# Patient Record
Sex: Female | Born: 1940
Health system: Southern US, Community
[De-identification: ages and names within clinical notes are randomized; demographics above are authoritative.]

## PROBLEM LIST (undated history)

## (undated) DIAGNOSIS — E785 Hyperlipidemia, unspecified: Secondary | ICD-10-CM

## (undated) DIAGNOSIS — Z90710 Acquired absence of both cervix and uterus: Secondary | ICD-10-CM

## (undated) DIAGNOSIS — F324 Major depressive disorder, single episode, in partial remission: Secondary | ICD-10-CM

## (undated) DIAGNOSIS — M0520 Rheumatoid vasculitis with rheumatoid arthritis of unspecified site: Secondary | ICD-10-CM

## (undated) DIAGNOSIS — M359 Systemic involvement of connective tissue, unspecified: Secondary | ICD-10-CM

## (undated) DIAGNOSIS — C801 Malignant (primary) neoplasm, unspecified: Secondary | ICD-10-CM

## (undated) DIAGNOSIS — K219 Gastro-esophageal reflux disease without esophagitis: Secondary | ICD-10-CM

## (undated) DIAGNOSIS — G709 Myoneural disorder, unspecified: Secondary | ICD-10-CM

## (undated) DIAGNOSIS — G629 Polyneuropathy, unspecified: Secondary | ICD-10-CM

## (undated) DIAGNOSIS — R0600 Dyspnea, unspecified: Secondary | ICD-10-CM

## (undated) DIAGNOSIS — R12 Heartburn: Secondary | ICD-10-CM

## (undated) DIAGNOSIS — I10 Essential (primary) hypertension: Secondary | ICD-10-CM

## (undated) HISTORY — DX: Major depressive disorder, single episode, in partial remission: F32.4

## (undated) HISTORY — PX: BREAST CYST ASPIRATION: SHX578

## (undated) HISTORY — DX: Heartburn: R12

## (undated) HISTORY — DX: Acquired absence of both cervix and uterus: Z90.710

## (undated) HISTORY — DX: Gastro-esophageal reflux disease without esophagitis: K21.9

## (undated) HISTORY — DX: Hyperlipidemia, unspecified: E78.5

---
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
## 1997-09-10 HISTORY — PX: OTHER SURGICAL HISTORY: SHX169

## 1998-04-11 ENCOUNTER — Ambulatory Visit (HOSPITAL_COMMUNITY): Admission: RE | Admit: 1998-04-11 | Discharge: 1998-04-11 | Payer: Self-pay

## 1998-06-01 ENCOUNTER — Observation Stay (HOSPITAL_COMMUNITY): Admission: RE | Admit: 1998-06-01 | Discharge: 1998-06-02 | Payer: Self-pay | Admitting: Orthopedic Surgery

## 1998-11-24 ENCOUNTER — Other Ambulatory Visit: Admission: RE | Admit: 1998-11-24 | Discharge: 1998-11-24 | Payer: Self-pay | Admitting: *Deleted

## 1999-01-19 ENCOUNTER — Ambulatory Visit (HOSPITAL_COMMUNITY): Admission: RE | Admit: 1999-01-19 | Discharge: 1999-01-19 | Payer: Self-pay | Admitting: *Deleted

## 1999-12-07 ENCOUNTER — Other Ambulatory Visit: Admission: RE | Admit: 1999-12-07 | Discharge: 1999-12-07 | Payer: Self-pay | Admitting: *Deleted

## 1999-12-29 ENCOUNTER — Encounter (INDEPENDENT_AMBULATORY_CARE_PROVIDER_SITE_OTHER): Payer: Self-pay

## 1999-12-29 ENCOUNTER — Other Ambulatory Visit: Admission: RE | Admit: 1999-12-29 | Discharge: 1999-12-29 | Payer: Self-pay | Admitting: *Deleted

## 2000-12-18 ENCOUNTER — Other Ambulatory Visit: Admission: RE | Admit: 2000-12-18 | Discharge: 2000-12-18 | Payer: Self-pay | Admitting: *Deleted

## 2001-12-19 ENCOUNTER — Other Ambulatory Visit: Admission: RE | Admit: 2001-12-19 | Discharge: 2001-12-19 | Payer: Self-pay | Admitting: *Deleted

## 2003-01-13 ENCOUNTER — Other Ambulatory Visit: Admission: RE | Admit: 2003-01-13 | Discharge: 2003-01-13 | Payer: Self-pay | Admitting: Obstetrics & Gynecology

## 2004-01-14 ENCOUNTER — Other Ambulatory Visit: Admission: RE | Admit: 2004-01-14 | Discharge: 2004-01-14 | Payer: Self-pay | Admitting: Obstetrics & Gynecology

## 2004-09-10 HISTORY — PX: SHOULDER SURGERY: SHX246

## 2004-10-03 ENCOUNTER — Ambulatory Visit: Payer: Self-pay | Admitting: General Practice

## 2005-10-02 ENCOUNTER — Ambulatory Visit: Payer: Self-pay | Admitting: Internal Medicine

## 2006-01-10 ENCOUNTER — Ambulatory Visit: Payer: Self-pay | Admitting: Specialist

## 2006-02-05 ENCOUNTER — Ambulatory Visit: Payer: Self-pay | Admitting: Specialist

## 2006-09-11 LAB — HM COLONOSCOPY: HM Colonoscopy: NORMAL

## 2006-10-08 ENCOUNTER — Ambulatory Visit: Payer: Self-pay | Admitting: Internal Medicine

## 2006-11-18 ENCOUNTER — Encounter: Admission: RE | Admit: 2006-11-18 | Discharge: 2006-11-18 | Payer: Self-pay

## 2006-12-06 ENCOUNTER — Encounter: Admission: RE | Admit: 2006-12-06 | Discharge: 2006-12-06 | Payer: Self-pay

## 2007-02-06 ENCOUNTER — Encounter: Admission: RE | Admit: 2007-02-06 | Discharge: 2007-02-06 | Payer: Self-pay

## 2007-02-20 ENCOUNTER — Encounter: Admission: RE | Admit: 2007-02-20 | Discharge: 2007-02-20 | Payer: Self-pay

## 2007-08-15 ENCOUNTER — Ambulatory Visit: Payer: Self-pay | Admitting: Internal Medicine

## 2007-10-16 ENCOUNTER — Ambulatory Visit: Payer: Self-pay | Admitting: Internal Medicine

## 2007-10-17 ENCOUNTER — Ambulatory Visit: Payer: Self-pay | Admitting: Internal Medicine

## 2008-03-17 ENCOUNTER — Encounter: Payer: Self-pay | Admitting: Neurosurgery

## 2008-04-14 ENCOUNTER — Ambulatory Visit: Payer: Self-pay | Admitting: Internal Medicine

## 2008-10-18 ENCOUNTER — Ambulatory Visit: Payer: Self-pay | Admitting: Internal Medicine

## 2009-08-18 ENCOUNTER — Ambulatory Visit: Payer: Self-pay | Admitting: Internal Medicine

## 2009-08-26 ENCOUNTER — Ambulatory Visit: Payer: Self-pay | Admitting: Neurosurgery

## 2009-09-10 HISTORY — PX: LUMBAR DISC SURGERY: SHX700

## 2009-09-10 HISTORY — PX: TOTAL KNEE ARTHROPLASTY: SHX125

## 2009-09-27 ENCOUNTER — Encounter: Admission: RE | Admit: 2009-09-27 | Discharge: 2009-09-27 | Payer: Self-pay | Admitting: Neurosurgery

## 2009-10-31 ENCOUNTER — Ambulatory Visit: Payer: Self-pay | Admitting: General Practice

## 2009-12-22 ENCOUNTER — Ambulatory Visit: Payer: Self-pay | Admitting: General Practice

## 2010-01-04 ENCOUNTER — Inpatient Hospital Stay: Payer: Self-pay | Admitting: General Practice

## 2010-01-06 ENCOUNTER — Encounter: Payer: Self-pay | Admitting: Internal Medicine

## 2010-01-08 ENCOUNTER — Encounter: Payer: Self-pay | Admitting: Internal Medicine

## 2010-03-23 ENCOUNTER — Encounter: Admission: RE | Admit: 2010-03-23 | Discharge: 2010-03-23 | Payer: Self-pay | Admitting: Neurosurgery

## 2010-04-19 ENCOUNTER — Inpatient Hospital Stay (HOSPITAL_COMMUNITY): Admission: RE | Admit: 2010-04-19 | Discharge: 2010-04-23 | Payer: Self-pay | Admitting: Neurosurgery

## 2010-05-25 ENCOUNTER — Encounter: Admission: RE | Admit: 2010-05-25 | Discharge: 2010-05-25 | Payer: Self-pay | Admitting: Neurosurgery

## 2010-07-04 ENCOUNTER — Encounter: Admission: RE | Admit: 2010-07-04 | Discharge: 2010-07-04 | Payer: Self-pay | Admitting: Neurosurgery

## 2010-08-22 ENCOUNTER — Ambulatory Visit: Payer: Self-pay | Admitting: Internal Medicine

## 2010-09-28 ENCOUNTER — Encounter
Admission: RE | Admit: 2010-09-28 | Discharge: 2010-09-28 | Payer: Self-pay | Source: Home / Self Care | Attending: Neurosurgery | Admitting: Neurosurgery

## 2010-11-24 LAB — CBC
HCT: 35.8 % — ABNORMAL LOW (ref 36.0–46.0)
Hemoglobin: 11.7 g/dL — ABNORMAL LOW (ref 12.0–15.0)
MCH: 30.3 pg (ref 26.0–34.0)
MCHC: 32.7 g/dL (ref 30.0–36.0)
MCV: 92.7 fL (ref 78.0–100.0)
Platelets: 299 10*3/uL (ref 150–400)
RBC: 3.86 MIL/uL — ABNORMAL LOW (ref 3.87–5.11)
RDW: 13.5 % (ref 11.5–15.5)
WBC: 5.2 10*3/uL (ref 4.0–10.5)

## 2010-11-24 LAB — BASIC METABOLIC PANEL
BUN: 22 mg/dL (ref 6–23)
CO2: 27 mEq/L (ref 19–32)
Calcium: 9.4 mg/dL (ref 8.4–10.5)
Chloride: 105 mEq/L (ref 96–112)
Creatinine, Ser: 0.83 mg/dL (ref 0.4–1.2)
GFR calc Af Amer: >60 mL/min (ref >60)
GFR calc non Af Amer: >60 mL/min (ref >60)
Glucose, Bld: 87 mg/dL (ref 70–99)
Sodium: 135 mEq/L (ref 135–145)

## 2010-11-24 LAB — ABO/RH: ABO/RH(D): A POS

## 2010-11-24 LAB — TYPE AND SCREEN: ABO/RH(D): A POS

## 2010-11-24 LAB — SURGICAL PCR SCREEN: MRSA, PCR: NEGATIVE

## 2010-11-29 ENCOUNTER — Ambulatory Visit: Payer: Self-pay | Admitting: Unknown Physician Specialty

## 2010-12-25 ENCOUNTER — Ambulatory Visit: Payer: Self-pay | Admitting: Unknown Physician Specialty

## 2010-12-26 ENCOUNTER — Ambulatory Visit
Admission: RE | Admit: 2010-12-26 | Discharge: 2010-12-26 | Disposition: A | Discharge status: Home / Self Care | Payer: Medicare Other | Source: Ambulatory Visit | Attending: Neurosurgery | Admitting: Neurosurgery

## 2010-12-26 ENCOUNTER — Other Ambulatory Visit: Payer: Self-pay | Admitting: Neurosurgery

## 2010-12-26 DIAGNOSIS — Z9889 Other specified postprocedural states: Secondary | ICD-10-CM

## 2010-12-28 LAB — PATHOLOGY REPORT

## 2011-04-28 ENCOUNTER — Ambulatory Visit: Payer: Self-pay | Admitting: Internal Medicine

## 2011-05-21 ENCOUNTER — Ambulatory Visit: Payer: Self-pay

## 2011-05-23 ENCOUNTER — Other Ambulatory Visit: Payer: Self-pay | Admitting: Rheumatology

## 2011-05-24 ENCOUNTER — Ambulatory Visit
Admission: RE | Admit: 2011-05-24 | Discharge: 2011-05-24 | Disposition: A | Discharge status: Home / Self Care | Payer: Medicare Other | Source: Ambulatory Visit | Attending: Rheumatology | Admitting: Rheumatology

## 2011-06-12 ENCOUNTER — Other Ambulatory Visit: Payer: Self-pay | Admitting: Neurosurgery

## 2011-06-12 ENCOUNTER — Ambulatory Visit
Admission: RE | Admit: 2011-06-12 | Discharge: 2011-06-12 | Disposition: A | Discharge status: Home / Self Care | Payer: Medicare Other | Source: Ambulatory Visit | Attending: Neurosurgery | Admitting: Neurosurgery

## 2011-06-12 DIAGNOSIS — M542 Cervicalgia: Secondary | ICD-10-CM

## 2011-06-12 DIAGNOSIS — M47817 Spondylosis without myelopathy or radiculopathy, lumbosacral region: Secondary | ICD-10-CM

## 2011-06-12 DIAGNOSIS — M519 Unspecified thoracic, thoracolumbar and lumbosacral intervertebral disc disorder: Secondary | ICD-10-CM

## 2011-06-12 DIAGNOSIS — M545 Low back pain: Secondary | ICD-10-CM

## 2011-07-19 ENCOUNTER — Encounter (HOSPITAL_BASED_OUTPATIENT_CLINIC_OR_DEPARTMENT_OTHER): Payer: Medicare Other | Attending: Internal Medicine

## 2011-07-19 DIAGNOSIS — K2100 Gastro-esophageal reflux disease with esophagitis, without bleeding: Secondary | ICD-10-CM | POA: Insufficient documentation

## 2011-07-19 DIAGNOSIS — Z79899 Other long term (current) drug therapy: Secondary | ICD-10-CM | POA: Insufficient documentation

## 2011-07-19 DIAGNOSIS — M7989 Other specified soft tissue disorders: Secondary | ICD-10-CM | POA: Insufficient documentation

## 2011-07-19 DIAGNOSIS — I10 Essential (primary) hypertension: Secondary | ICD-10-CM | POA: Insufficient documentation

## 2011-07-19 DIAGNOSIS — IMO0002 Reserved for concepts with insufficient information to code with codable children: Secondary | ICD-10-CM | POA: Insufficient documentation

## 2011-07-19 DIAGNOSIS — S91009A Unspecified open wound, unspecified ankle, initial encounter: Secondary | ICD-10-CM | POA: Insufficient documentation

## 2011-07-19 DIAGNOSIS — L02419 Cutaneous abscess of limb, unspecified: Secondary | ICD-10-CM | POA: Insufficient documentation

## 2011-07-19 DIAGNOSIS — S81009A Unspecified open wound, unspecified knee, initial encounter: Secondary | ICD-10-CM | POA: Insufficient documentation

## 2011-07-19 DIAGNOSIS — Y92009 Unspecified place in unspecified non-institutional (private) residence as the place of occurrence of the external cause: Secondary | ICD-10-CM | POA: Insufficient documentation

## 2011-07-19 NOTE — Progress Notes (Unsigned)
[facility]  NAME:  Caroline Conway, Caroline Conway             ACCOUNT NO.:  [id number]  MEDICAL RECORD NO.:  [id number]      DATE OF BIRTH:  1940-12-16  PHYSICIAN:  Maxwell Caul, M.D. VISIT DATE:  07/19/2011                                  OFFICE VISIT   Caroline Conway is a pleasant 70 year old woman referred by Dr. Dareen Piano at [facility] for review of a wound on her right lower leg.  Caroline Conway tells me that the wound came about in September when she traumatized her legs at home.  She took some time to try and manage this herself keeping it clean, covering with a Band-Aid, etc.  Ultimately she went to her primary care doctor and was prescribed a topical sulfa and that did not really help at all.  She took a course of Keflex for 3 weeks thinking there was surrounding cellulitis here, although I do not know that there was a culture result, ultimately she also received a course of clindamycin that does not seemed to have helped at all.  Around the same time in September, she started to develop painful nodules in her right and left leg.  I think the thought was that this was initially erythema nodosum; however, she tells me that Dr. Dareen Piano biopsied one of these areas, which did not show this.  She has been on methotrexate and Remicade in the past, although she is off Remicade due to headaches.  She is not on chronic prednisone.  PAST MEDICAL HISTORY: 1. Hypertension. 2. Reflux esophagitis. 3. Rheumatoid arthritis.  She has had surgery on her bilateral rotator cuffs, bilateral knee replacements, and back surgery.  CURRENT MEDICATION:  Reviewed.  She is on atenolol, simvastatin, hydrochlorothiazide, Wellbutrin, folic acid, Ambien, Biotin, and vitamin B12.  Interestingly, I do not see the methotrexate on our list.  ALLERGIES:  SULFA and PENICILLIN.  PHYSICAL EXAMINATION:  Temperature 98.4, pulse 58, respirations 18, blood pressure 142/89.   Circulation, her peripheral pulses were well palpable in her bilateral lower legs.  An ABI done on the right in this clinic was 0.8.  Her capillary refill time was within normal limits.  The wound is on the right anterior leg.  It measures 1.3 x 0.6 x 0.1. This was covered with eschar.  The eschar was removed with a #15 scalpel.  She tolerated this well.  The wound cleans up fairly nicely. I did culture this, although there is no convincing evidence of infection.  She has a violaceous color around this extending 0.5 cm around the wound.  More problematic than this, the wound is actually on the top edge of a more inflamed warm area that is roughly 2-1/2 x 2-1/2 inches below the wound.  She has a similar area on the left leg with swelling and warmth and tenderness; however, there is no open area here.  IMPRESSION: 1. Traumatic wound on the right anterior leg as described above.  This     underwent a non-excisional debridement.  We dressed this with     silver collagen covered by an occlusive Tielle which can be left on     to the next week. 2. Inflamed swollen areas on the right leg involving the wound, but     also on the left  leg without a wound.  Apparently biopsies have     been done of a similar area here, which does not show evidence of     erythema nodosum.  She is due to see Dermatology at [facility] next week.     Although I am puzzled about the etiology of this and look forward     to the consultation from Dermatology, any degree of chronic     inflammation will inhibit wound healing, so I am not surprised that     the small wound she had has been resistant to healing.  The wound was cultured today for C and S, although I did not add empiric antibiotics as mentioned.  She has also already gone through a 3-week course of Keflex and a 10-day course of clindamycin, neither of these made substantial difference to the wound per the patient.  She will be seen next week surrounding her trip  to [facility].          ______________________________ Maxwell Caul, M.D.     MGR/MEDQ  D:  07/19/2011  T:  07/19/2011  Job:  [id number]

## 2011-08-22 ENCOUNTER — Encounter (HOSPITAL_BASED_OUTPATIENT_CLINIC_OR_DEPARTMENT_OTHER): Payer: Medicare Other | Attending: Internal Medicine

## 2011-08-22 DIAGNOSIS — L97809 Non-pressure chronic ulcer of other part of unspecified lower leg with unspecified severity: Secondary | ICD-10-CM | POA: Insufficient documentation

## 2011-08-22 NOTE — Progress Notes (Signed)
[facility]  NAME:  ROSALEIGH, BRAZZEL             ACCOUNT NO.:  [id number]  MEDICAL RECORD NO.:  [id number]      DATE OF BIRTH:  Oct 01, 1940  PHYSICIAN:  Wayland Denis, DO       VISIT DATE:  08/22/2011                                  OFFICE VISIT   HISTORY:  Rezek is a 70 year old female who is here for followup on her right lower extremity ulcer.  She has been using collagen on it with excellent improvement.  She has epithelialized and granulated, and she is very pleased with the results.  She has some dry skin in the peri- wound area, which is common for this time of year.  There has been no change in her medications or social history.  PHYSICAL EXAMINATION:  She is alert, oriented, cooperative, very pleasant.  Her pupils are equal.  Extraocular muscles are intact.  Her breathing is unlabored.  Her heart rate is regular.  The wound is healed.  We will have her to continue with particular care to the area so that it does not open back up, and we would like her to use lotion on it and let us know if there is any change; otherwise, she has done extremely well.  I will see her back if needed.     Wayland Denis, DO     CS/MEDQ  D:  08/22/2011  T:  08/22/2011  Job:  [id number]

## 2011-08-23 ENCOUNTER — Encounter (HOSPITAL_BASED_OUTPATIENT_CLINIC_OR_DEPARTMENT_OTHER): Payer: Medicare Other

## 2011-09-06 ENCOUNTER — Ambulatory Visit: Payer: Self-pay | Admitting: Internal Medicine

## 2011-09-15 ENCOUNTER — Ambulatory Visit: Payer: Self-pay

## 2011-09-17 DIAGNOSIS — R1032 Left lower quadrant pain: Secondary | ICD-10-CM | POA: Diagnosis not present

## 2011-09-17 DIAGNOSIS — K219 Gastro-esophageal reflux disease without esophagitis: Secondary | ICD-10-CM | POA: Diagnosis not present

## 2011-09-25 DIAGNOSIS — M069 Rheumatoid arthritis, unspecified: Secondary | ICD-10-CM | POA: Diagnosis not present

## 2011-10-01 DIAGNOSIS — M1990 Unspecified osteoarthritis, unspecified site: Secondary | ICD-10-CM | POA: Diagnosis not present

## 2011-10-01 DIAGNOSIS — M069 Rheumatoid arthritis, unspecified: Secondary | ICD-10-CM | POA: Diagnosis not present

## 2011-11-20 DIAGNOSIS — M069 Rheumatoid arthritis, unspecified: Secondary | ICD-10-CM | POA: Diagnosis not present

## 2011-11-23 DIAGNOSIS — M5137 Other intervertebral disc degeneration, lumbosacral region: Secondary | ICD-10-CM | POA: Diagnosis not present

## 2011-11-23 DIAGNOSIS — M47817 Spondylosis without myelopathy or radiculopathy, lumbosacral region: Secondary | ICD-10-CM | POA: Diagnosis not present

## 2011-11-23 DIAGNOSIS — M25559 Pain in unspecified hip: Secondary | ICD-10-CM | POA: Diagnosis not present

## 2011-11-27 ENCOUNTER — Other Ambulatory Visit: Payer: Self-pay | Admitting: Neurosurgery

## 2011-11-27 DIAGNOSIS — M25552 Pain in left hip: Secondary | ICD-10-CM

## 2011-12-02 ENCOUNTER — Other Ambulatory Visit: Payer: Medicare Other

## 2011-12-02 ENCOUNTER — Ambulatory Visit
Admission: RE | Admit: 2011-12-02 | Discharge: 2011-12-02 | Disposition: A | Discharge status: Home / Self Care | Payer: Medicare Other | Source: Ambulatory Visit | Attending: Neurosurgery | Admitting: Neurosurgery

## 2011-12-02 DIAGNOSIS — M25559 Pain in unspecified hip: Secondary | ICD-10-CM | POA: Diagnosis not present

## 2011-12-02 DIAGNOSIS — M25552 Pain in left hip: Secondary | ICD-10-CM

## 2011-12-06 DIAGNOSIS — M47817 Spondylosis without myelopathy or radiculopathy, lumbosacral region: Secondary | ICD-10-CM | POA: Diagnosis not present

## 2011-12-06 DIAGNOSIS — M5137 Other intervertebral disc degeneration, lumbosacral region: Secondary | ICD-10-CM | POA: Diagnosis not present

## 2012-01-08 DIAGNOSIS — L52 Erythema nodosum: Secondary | ICD-10-CM | POA: Diagnosis not present

## 2012-01-08 DIAGNOSIS — M069 Rheumatoid arthritis, unspecified: Secondary | ICD-10-CM | POA: Diagnosis not present

## 2012-01-08 DIAGNOSIS — M1990 Unspecified osteoarthritis, unspecified site: Secondary | ICD-10-CM | POA: Diagnosis not present

## 2012-01-15 DIAGNOSIS — M069 Rheumatoid arthritis, unspecified: Secondary | ICD-10-CM | POA: Diagnosis not present

## 2012-01-18 DIAGNOSIS — M76899 Other specified enthesopathies of unspecified lower limb, excluding foot: Secondary | ICD-10-CM | POA: Diagnosis not present

## 2012-01-31 DIAGNOSIS — F411 Generalized anxiety disorder: Secondary | ICD-10-CM | POA: Diagnosis not present

## 2012-01-31 DIAGNOSIS — R51 Headache: Secondary | ICD-10-CM | POA: Diagnosis not present

## 2012-01-31 DIAGNOSIS — G4700 Insomnia, unspecified: Secondary | ICD-10-CM | POA: Diagnosis not present

## 2012-01-31 DIAGNOSIS — I10 Essential (primary) hypertension: Secondary | ICD-10-CM | POA: Diagnosis not present

## 2012-03-17 DIAGNOSIS — M069 Rheumatoid arthritis, unspecified: Secondary | ICD-10-CM | POA: Diagnosis not present

## 2012-03-19 DIAGNOSIS — G589 Mononeuropathy, unspecified: Secondary | ICD-10-CM | POA: Diagnosis not present

## 2012-03-19 DIAGNOSIS — D510 Vitamin B12 deficiency anemia due to intrinsic factor deficiency: Secondary | ICD-10-CM | POA: Diagnosis not present

## 2012-03-19 DIAGNOSIS — E785 Hyperlipidemia, unspecified: Secondary | ICD-10-CM | POA: Diagnosis not present

## 2012-03-19 DIAGNOSIS — R252 Cramp and spasm: Secondary | ICD-10-CM | POA: Diagnosis not present

## 2012-03-19 DIAGNOSIS — G4700 Insomnia, unspecified: Secondary | ICD-10-CM | POA: Diagnosis not present

## 2012-03-19 DIAGNOSIS — F411 Generalized anxiety disorder: Secondary | ICD-10-CM | POA: Diagnosis not present

## 2012-03-19 DIAGNOSIS — I10 Essential (primary) hypertension: Secondary | ICD-10-CM | POA: Diagnosis not present

## 2012-04-08 DIAGNOSIS — M1990 Unspecified osteoarthritis, unspecified site: Secondary | ICD-10-CM | POA: Diagnosis not present

## 2012-04-08 DIAGNOSIS — M069 Rheumatoid arthritis, unspecified: Secondary | ICD-10-CM | POA: Diagnosis not present

## 2012-04-08 DIAGNOSIS — M810 Age-related osteoporosis without current pathological fracture: Secondary | ICD-10-CM | POA: Diagnosis not present

## 2012-05-15 DIAGNOSIS — M069 Rheumatoid arthritis, unspecified: Secondary | ICD-10-CM | POA: Diagnosis not present

## 2012-05-23 DIAGNOSIS — G589 Mononeuropathy, unspecified: Secondary | ICD-10-CM | POA: Diagnosis not present

## 2012-05-23 DIAGNOSIS — I10 Essential (primary) hypertension: Secondary | ICD-10-CM | POA: Diagnosis not present

## 2012-05-23 DIAGNOSIS — N644 Mastodynia: Secondary | ICD-10-CM | POA: Diagnosis not present

## 2012-05-23 DIAGNOSIS — G4700 Insomnia, unspecified: Secondary | ICD-10-CM | POA: Diagnosis not present

## 2012-06-17 DIAGNOSIS — D235 Other benign neoplasm of skin of trunk: Secondary | ICD-10-CM | POA: Diagnosis not present

## 2012-06-17 DIAGNOSIS — D485 Neoplasm of uncertain behavior of skin: Secondary | ICD-10-CM | POA: Diagnosis not present

## 2012-06-17 DIAGNOSIS — C44319 Basal cell carcinoma of skin of other parts of face: Secondary | ICD-10-CM | POA: Diagnosis not present

## 2012-07-02 DIAGNOSIS — H538 Other visual disturbances: Secondary | ICD-10-CM | POA: Diagnosis not present

## 2012-07-03 DIAGNOSIS — C44319 Basal cell carcinoma of skin of other parts of face: Secondary | ICD-10-CM | POA: Diagnosis not present

## 2012-07-10 DIAGNOSIS — M069 Rheumatoid arthritis, unspecified: Secondary | ICD-10-CM | POA: Diagnosis not present

## 2012-07-15 DIAGNOSIS — Z23 Encounter for immunization: Secondary | ICD-10-CM | POA: Diagnosis not present

## 2012-07-17 DIAGNOSIS — Z96659 Presence of unspecified artificial knee joint: Secondary | ICD-10-CM | POA: Diagnosis not present

## 2012-07-21 DIAGNOSIS — F329 Major depressive disorder, single episode, unspecified: Secondary | ICD-10-CM | POA: Diagnosis not present

## 2012-07-24 DIAGNOSIS — H40009 Preglaucoma, unspecified, unspecified eye: Secondary | ICD-10-CM | POA: Diagnosis not present

## 2012-07-29 DIAGNOSIS — I10 Essential (primary) hypertension: Secondary | ICD-10-CM | POA: Diagnosis not present

## 2012-07-29 DIAGNOSIS — G609 Hereditary and idiopathic neuropathy, unspecified: Secondary | ICD-10-CM | POA: Diagnosis not present

## 2012-07-29 DIAGNOSIS — G2581 Restless legs syndrome: Secondary | ICD-10-CM | POA: Diagnosis not present

## 2012-07-30 DIAGNOSIS — H40009 Preglaucoma, unspecified, unspecified eye: Secondary | ICD-10-CM | POA: Diagnosis not present

## 2012-08-11 DIAGNOSIS — M810 Age-related osteoporosis without current pathological fracture: Secondary | ICD-10-CM | POA: Diagnosis not present

## 2012-08-11 DIAGNOSIS — M069 Rheumatoid arthritis, unspecified: Secondary | ICD-10-CM | POA: Diagnosis not present

## 2012-08-11 DIAGNOSIS — M1990 Unspecified osteoarthritis, unspecified site: Secondary | ICD-10-CM | POA: Diagnosis not present

## 2012-08-21 DIAGNOSIS — G609 Hereditary and idiopathic neuropathy, unspecified: Secondary | ICD-10-CM | POA: Diagnosis not present

## 2012-08-26 DIAGNOSIS — G589 Mononeuropathy, unspecified: Secondary | ICD-10-CM | POA: Diagnosis not present

## 2012-08-26 DIAGNOSIS — I10 Essential (primary) hypertension: Secondary | ICD-10-CM | POA: Diagnosis not present

## 2012-08-26 DIAGNOSIS — F329 Major depressive disorder, single episode, unspecified: Secondary | ICD-10-CM | POA: Diagnosis not present

## 2012-09-08 ENCOUNTER — Ambulatory Visit: Payer: Self-pay | Admitting: Internal Medicine

## 2012-09-08 DIAGNOSIS — Z1231 Encounter for screening mammogram for malignant neoplasm of breast: Secondary | ICD-10-CM | POA: Diagnosis not present

## 2012-10-07 DIAGNOSIS — Z85828 Personal history of other malignant neoplasm of skin: Secondary | ICD-10-CM | POA: Diagnosis not present

## 2012-10-07 DIAGNOSIS — D235 Other benign neoplasm of skin of trunk: Secondary | ICD-10-CM | POA: Diagnosis not present

## 2012-10-10 DIAGNOSIS — N390 Urinary tract infection, site not specified: Secondary | ICD-10-CM | POA: Diagnosis not present

## 2012-10-17 DIAGNOSIS — R319 Hematuria, unspecified: Secondary | ICD-10-CM | POA: Diagnosis not present

## 2012-10-20 DIAGNOSIS — G603 Idiopathic progressive neuropathy: Secondary | ICD-10-CM | POA: Diagnosis not present

## 2012-11-10 DIAGNOSIS — M069 Rheumatoid arthritis, unspecified: Secondary | ICD-10-CM | POA: Diagnosis not present

## 2012-11-10 DIAGNOSIS — M1990 Unspecified osteoarthritis, unspecified site: Secondary | ICD-10-CM | POA: Diagnosis not present

## 2012-11-10 DIAGNOSIS — M810 Age-related osteoporosis without current pathological fracture: Secondary | ICD-10-CM | POA: Diagnosis not present

## 2013-01-29 DIAGNOSIS — N8111 Cystocele, midline: Secondary | ICD-10-CM | POA: Diagnosis not present

## 2013-01-29 DIAGNOSIS — N3941 Urge incontinence: Secondary | ICD-10-CM | POA: Diagnosis not present

## 2013-01-29 DIAGNOSIS — N816 Rectocele: Secondary | ICD-10-CM | POA: Diagnosis not present

## 2013-01-29 DIAGNOSIS — N3946 Mixed incontinence: Secondary | ICD-10-CM | POA: Diagnosis not present

## 2013-02-06 IMAGING — MG MM DIGITAL SCREENING BILAT W/ CAD
1 series · 4 of 4 positions shown · non-contrast
Comparison: none

REASON FOR EXAM: SCR MAMMO NO ORDER
COMMENTS:

PROCEDURE:     MMM - MMM DGT SCR NO ORDER W/CAD  - September 08, 2012 [DATE]
RESULT:
Comparisons: 09/06/2011, 08/22/2010, 10/18/2008, 08/18/2009, and 04/14/2008.

[R CC · right · 4 of 4 slices shown]
[im 1/4]
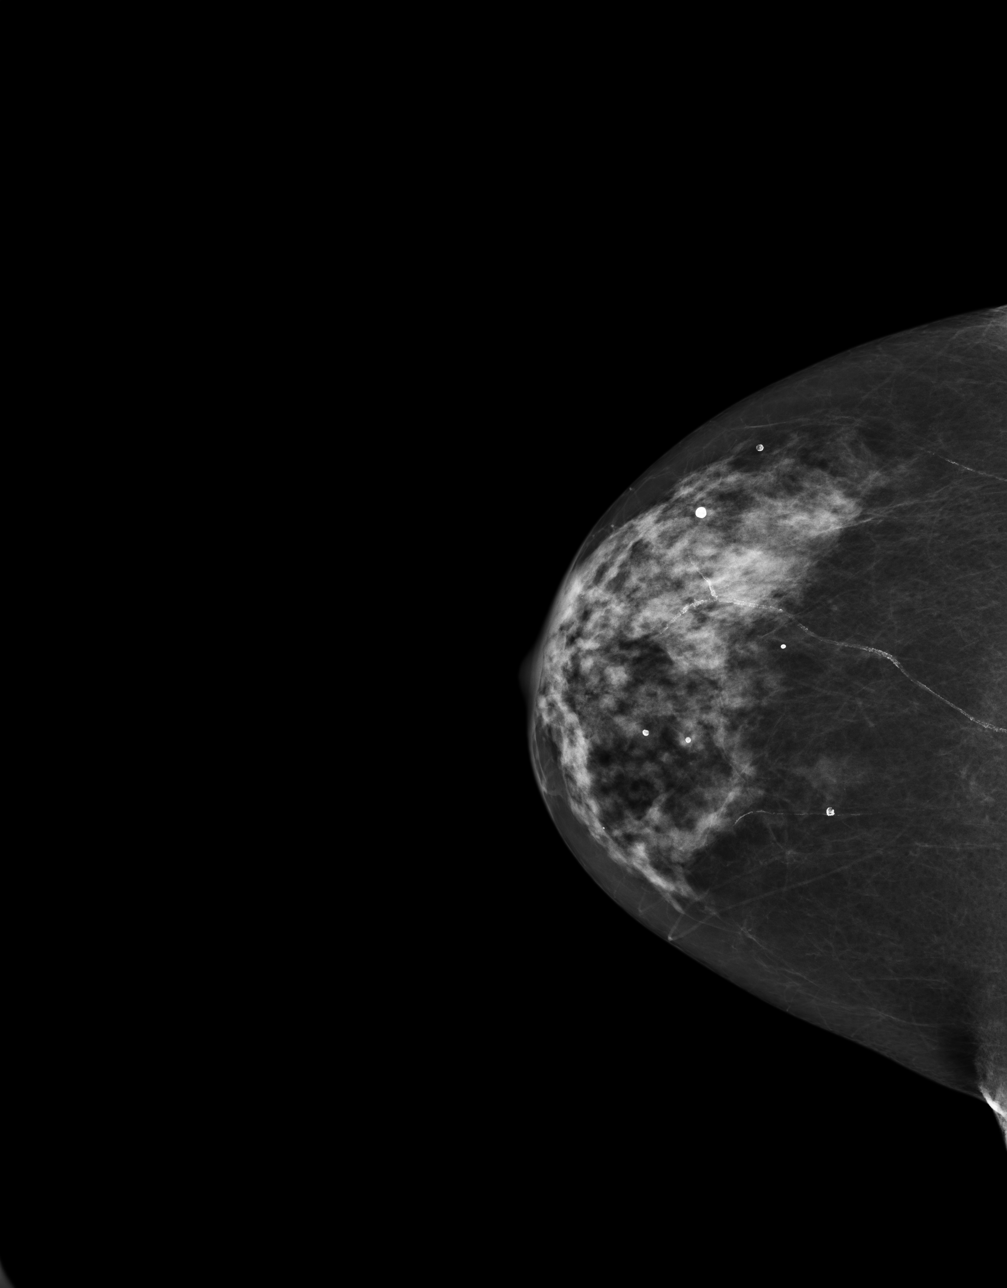
[im 2/4]
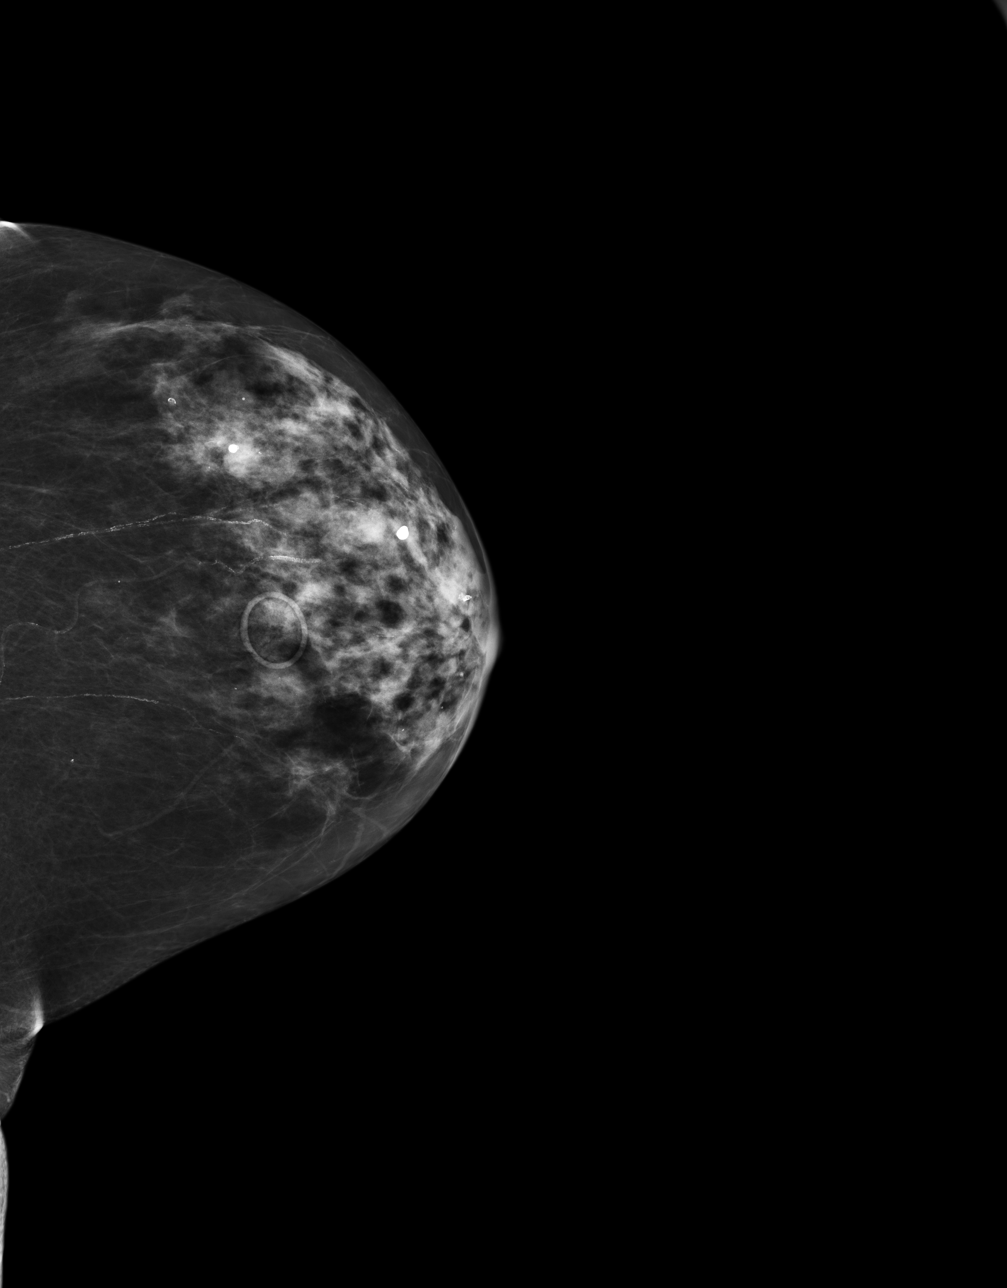
[im 3/4]
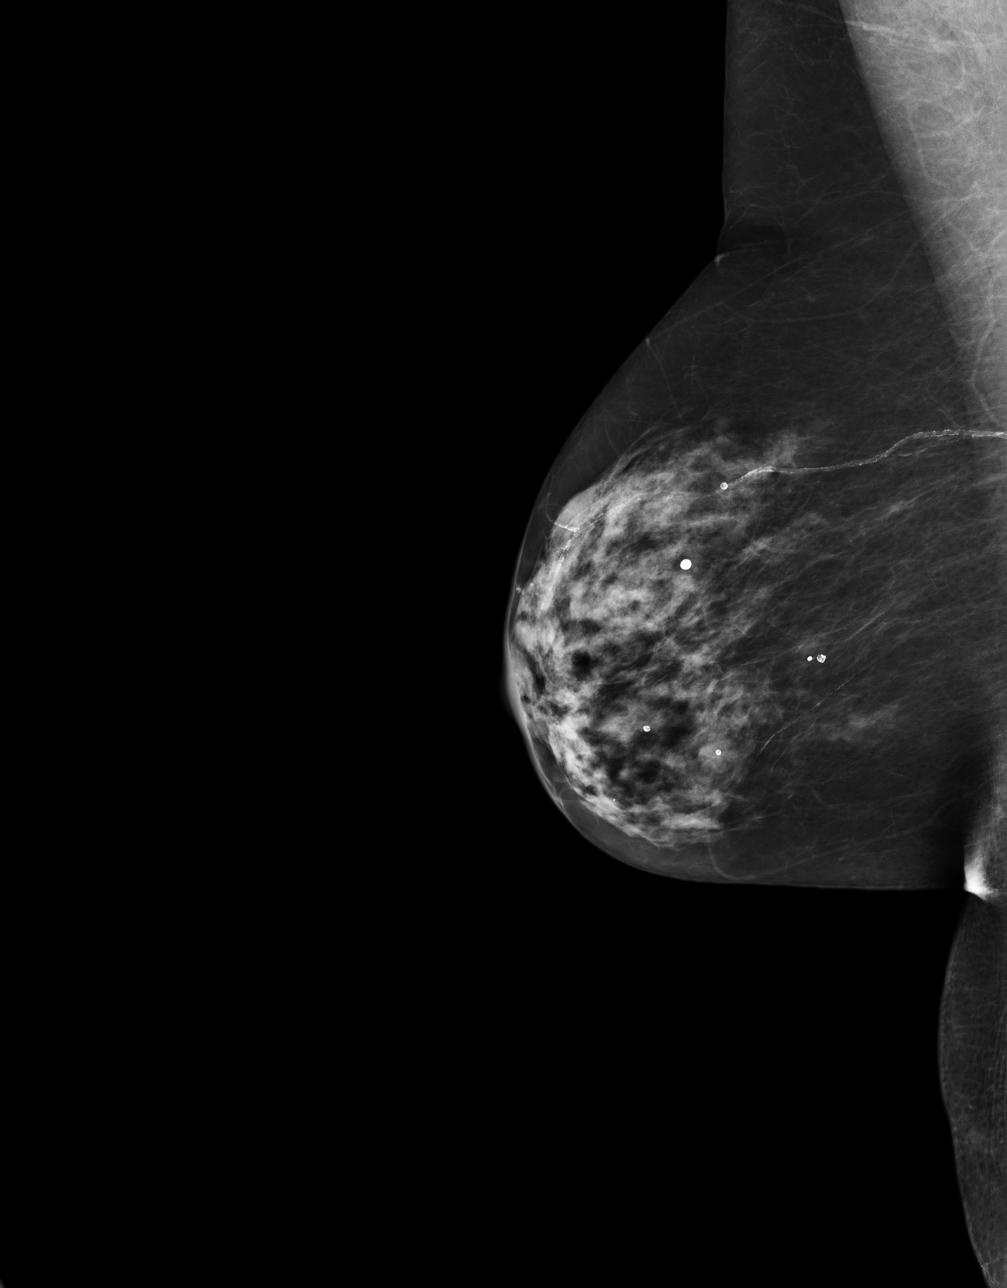
[im 4/4]
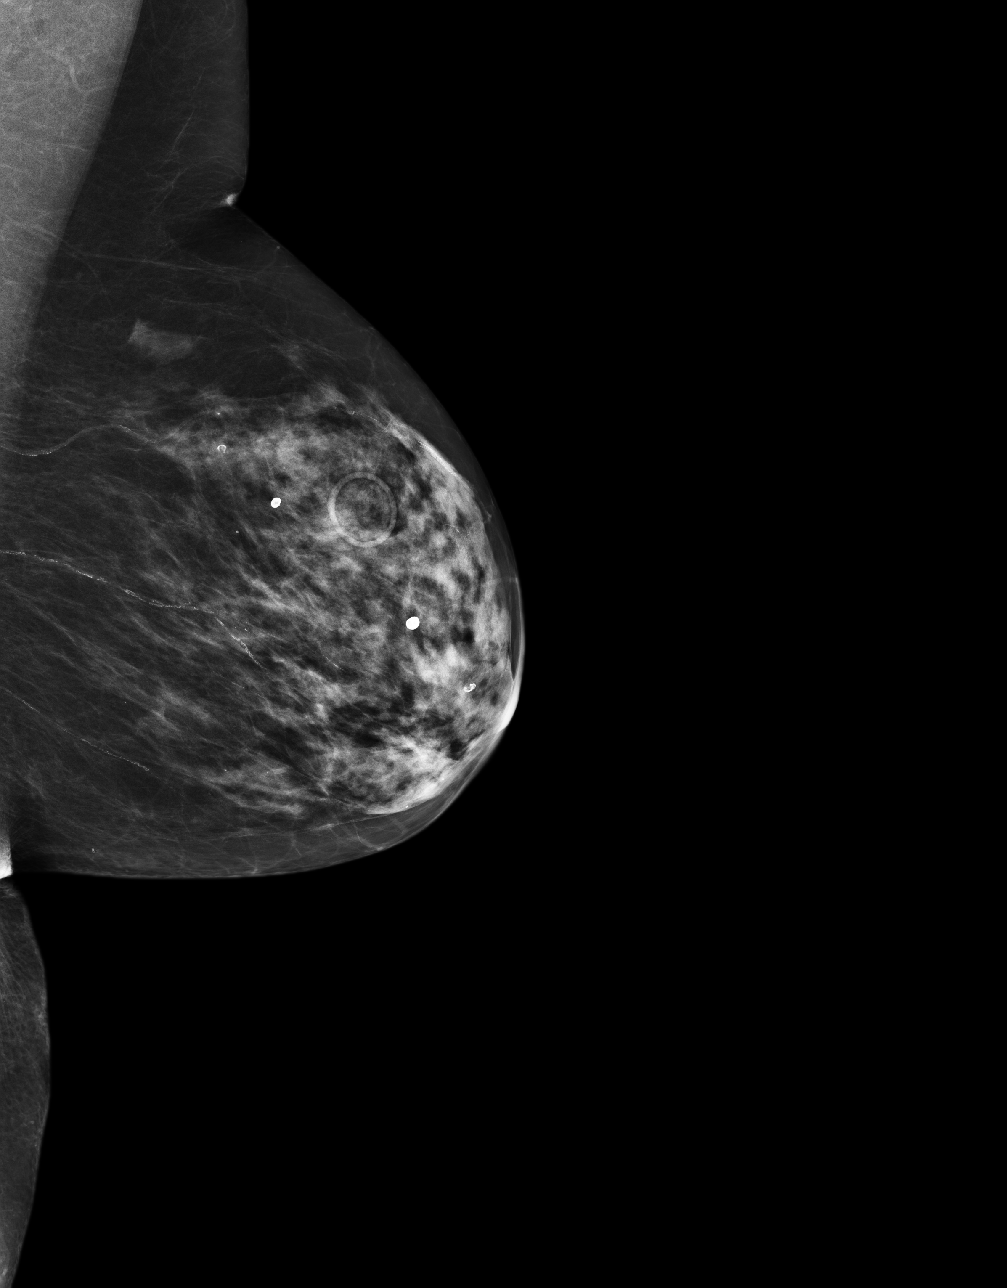

[4 of 4 positions shown; findings below may reference images not displayed]

FINDINGS: The breast tissue is heterogeneously dense, which may lower the sensitivity
of mammography. No suspicious masses or calcifications are identified. No
areas of architectural distortion.
IMPRESSION: BI-RADS: Category 1 - Negative

Recommend continued annual screening mammography.

A NEGATIVE MAMMOGRAM REPORT DOES NOT PRECLUDE BIOPSY OR OTHER EVALUATION OF
A CLINICALLY PALPABLE OR OTHERWISE SUSPICIOUS MASS OR LESION. BREAST CANCER
MAY NOT BE DETECTED BY MAMMOGRAPHY IN UP TO 10% OF CASES.

[REDACTED]

## 2013-02-10 DIAGNOSIS — M069 Rheumatoid arthritis, unspecified: Secondary | ICD-10-CM | POA: Diagnosis not present

## 2013-02-10 DIAGNOSIS — M810 Age-related osteoporosis without current pathological fracture: Secondary | ICD-10-CM | POA: Diagnosis not present

## 2013-02-10 DIAGNOSIS — M1990 Unspecified osteoarthritis, unspecified site: Secondary | ICD-10-CM | POA: Diagnosis not present

## 2013-02-23 DIAGNOSIS — J019 Acute sinusitis, unspecified: Secondary | ICD-10-CM | POA: Diagnosis not present

## 2013-03-01 ENCOUNTER — Ambulatory Visit: Payer: Self-pay | Admitting: Family Medicine

## 2013-03-01 DIAGNOSIS — M069 Rheumatoid arthritis, unspecified: Secondary | ICD-10-CM | POA: Diagnosis not present

## 2013-03-01 DIAGNOSIS — K219 Gastro-esophageal reflux disease without esophagitis: Secondary | ICD-10-CM | POA: Diagnosis not present

## 2013-03-01 DIAGNOSIS — N390 Urinary tract infection, site not specified: Secondary | ICD-10-CM | POA: Diagnosis not present

## 2013-03-01 DIAGNOSIS — I10 Essential (primary) hypertension: Secondary | ICD-10-CM | POA: Diagnosis not present

## 2013-03-01 LAB — URINALYSIS, COMPLETE
Nitrite: POSITIVE
Ph: 7 (ref 4.5–8.0)

## 2013-03-05 DIAGNOSIS — N3946 Mixed incontinence: Secondary | ICD-10-CM | POA: Diagnosis not present

## 2013-03-05 DIAGNOSIS — N819 Female genital prolapse, unspecified: Secondary | ICD-10-CM | POA: Diagnosis not present

## 2013-03-05 DIAGNOSIS — R319 Hematuria, unspecified: Secondary | ICD-10-CM | POA: Diagnosis not present

## 2013-04-20 DIAGNOSIS — G609 Hereditary and idiopathic neuropathy, unspecified: Secondary | ICD-10-CM | POA: Diagnosis not present

## 2013-04-20 DIAGNOSIS — R413 Other amnesia: Secondary | ICD-10-CM | POA: Diagnosis not present

## 2013-04-28 DIAGNOSIS — M069 Rheumatoid arthritis, unspecified: Secondary | ICD-10-CM | POA: Diagnosis not present

## 2013-05-19 DIAGNOSIS — M1990 Unspecified osteoarthritis, unspecified site: Secondary | ICD-10-CM | POA: Diagnosis not present

## 2013-05-19 DIAGNOSIS — M810 Age-related osteoporosis without current pathological fracture: Secondary | ICD-10-CM | POA: Diagnosis not present

## 2013-05-19 DIAGNOSIS — M069 Rheumatoid arthritis, unspecified: Secondary | ICD-10-CM | POA: Diagnosis not present

## 2013-06-22 DIAGNOSIS — D235 Other benign neoplasm of skin of trunk: Secondary | ICD-10-CM | POA: Diagnosis not present

## 2013-06-22 DIAGNOSIS — Z85828 Personal history of other malignant neoplasm of skin: Secondary | ICD-10-CM | POA: Diagnosis not present

## 2013-06-22 DIAGNOSIS — L821 Other seborrheic keratosis: Secondary | ICD-10-CM | POA: Diagnosis not present

## 2013-06-22 DIAGNOSIS — L570 Actinic keratosis: Secondary | ICD-10-CM | POA: Diagnosis not present

## 2013-06-25 DIAGNOSIS — Z96659 Presence of unspecified artificial knee joint: Secondary | ICD-10-CM | POA: Diagnosis not present

## 2013-07-02 DIAGNOSIS — Z0000 Encounter for general adult medical examination without abnormal findings: Secondary | ICD-10-CM | POA: Diagnosis not present

## 2013-07-02 DIAGNOSIS — F329 Major depressive disorder, single episode, unspecified: Secondary | ICD-10-CM | POA: Diagnosis not present

## 2013-07-02 DIAGNOSIS — I10 Essential (primary) hypertension: Secondary | ICD-10-CM | POA: Diagnosis not present

## 2013-07-02 DIAGNOSIS — Z23 Encounter for immunization: Secondary | ICD-10-CM | POA: Diagnosis not present

## 2013-07-02 DIAGNOSIS — E785 Hyperlipidemia, unspecified: Secondary | ICD-10-CM | POA: Diagnosis not present

## 2013-07-20 DIAGNOSIS — R319 Hematuria, unspecified: Secondary | ICD-10-CM | POA: Diagnosis not present

## 2013-08-03 DIAGNOSIS — R310 Gross hematuria: Secondary | ICD-10-CM | POA: Diagnosis not present

## 2013-08-12 ENCOUNTER — Ambulatory Visit: Payer: Self-pay | Admitting: Urology

## 2013-08-12 DIAGNOSIS — R9389 Abnormal findings on diagnostic imaging of other specified body structures: Secondary | ICD-10-CM | POA: Diagnosis not present

## 2013-08-12 DIAGNOSIS — R319 Hematuria, unspecified: Secondary | ICD-10-CM | POA: Diagnosis not present

## 2013-08-12 DIAGNOSIS — N390 Urinary tract infection, site not specified: Secondary | ICD-10-CM | POA: Diagnosis not present

## 2013-08-13 DIAGNOSIS — J040 Acute laryngitis: Secondary | ICD-10-CM | POA: Diagnosis not present

## 2013-08-17 DIAGNOSIS — R9389 Abnormal findings on diagnostic imaging of other specified body structures: Secondary | ICD-10-CM | POA: Diagnosis not present

## 2013-08-17 DIAGNOSIS — R310 Gross hematuria: Secondary | ICD-10-CM | POA: Diagnosis not present

## 2013-09-09 ENCOUNTER — Ambulatory Visit: Payer: Self-pay | Admitting: Internal Medicine

## 2013-09-09 DIAGNOSIS — Z1231 Encounter for screening mammogram for malignant neoplasm of breast: Secondary | ICD-10-CM | POA: Diagnosis not present

## 2013-09-21 DIAGNOSIS — G609 Hereditary and idiopathic neuropathy, unspecified: Secondary | ICD-10-CM | POA: Diagnosis not present

## 2013-09-22 DIAGNOSIS — M1990 Unspecified osteoarthritis, unspecified site: Secondary | ICD-10-CM | POA: Diagnosis not present

## 2013-09-22 DIAGNOSIS — M810 Age-related osteoporosis without current pathological fracture: Secondary | ICD-10-CM | POA: Diagnosis not present

## 2013-09-22 DIAGNOSIS — M069 Rheumatoid arthritis, unspecified: Secondary | ICD-10-CM | POA: Diagnosis not present

## 2013-10-01 DIAGNOSIS — R9389 Abnormal findings on diagnostic imaging of other specified body structures: Secondary | ICD-10-CM | POA: Diagnosis not present

## 2013-10-01 DIAGNOSIS — R310 Gross hematuria: Secondary | ICD-10-CM | POA: Diagnosis not present

## 2013-10-05 ENCOUNTER — Ambulatory Visit: Payer: Self-pay | Admitting: Urology

## 2013-10-05 DIAGNOSIS — Z01812 Encounter for preprocedural laboratory examination: Secondary | ICD-10-CM | POA: Diagnosis not present

## 2013-10-05 DIAGNOSIS — Z01810 Encounter for preprocedural cardiovascular examination: Secondary | ICD-10-CM | POA: Diagnosis not present

## 2013-10-05 DIAGNOSIS — I10 Essential (primary) hypertension: Secondary | ICD-10-CM | POA: Diagnosis not present

## 2013-10-05 DIAGNOSIS — R310 Gross hematuria: Secondary | ICD-10-CM | POA: Diagnosis not present

## 2013-10-05 DIAGNOSIS — I119 Hypertensive heart disease without heart failure: Secondary | ICD-10-CM | POA: Diagnosis not present

## 2013-10-05 LAB — POTASSIUM: POTASSIUM: 3.8 mmol/L (ref 3.5–5.1)

## 2013-10-12 ENCOUNTER — Ambulatory Visit: Payer: Self-pay | Admitting: Urology

## 2013-10-12 DIAGNOSIS — Z885 Allergy status to narcotic agent status: Secondary | ICD-10-CM | POA: Diagnosis not present

## 2013-10-12 DIAGNOSIS — Z85828 Personal history of other malignant neoplasm of skin: Secondary | ICD-10-CM | POA: Diagnosis not present

## 2013-10-12 DIAGNOSIS — R310 Gross hematuria: Secondary | ICD-10-CM | POA: Diagnosis not present

## 2013-10-12 DIAGNOSIS — N135 Crossing vessel and stricture of ureter without hydronephrosis: Secondary | ICD-10-CM | POA: Diagnosis not present

## 2013-10-12 DIAGNOSIS — Z96659 Presence of unspecified artificial knee joint: Secondary | ICD-10-CM | POA: Diagnosis not present

## 2013-10-12 DIAGNOSIS — M069 Rheumatoid arthritis, unspecified: Secondary | ICD-10-CM | POA: Diagnosis not present

## 2013-10-12 DIAGNOSIS — Z882 Allergy status to sulfonamides status: Secondary | ICD-10-CM | POA: Diagnosis not present

## 2013-10-12 DIAGNOSIS — R1013 Epigastric pain: Secondary | ICD-10-CM | POA: Diagnosis not present

## 2013-10-12 DIAGNOSIS — R319 Hematuria, unspecified: Secondary | ICD-10-CM | POA: Diagnosis not present

## 2013-10-12 DIAGNOSIS — G609 Hereditary and idiopathic neuropathy, unspecified: Secondary | ICD-10-CM | POA: Diagnosis not present

## 2013-10-12 DIAGNOSIS — I10 Essential (primary) hypertension: Secondary | ICD-10-CM | POA: Diagnosis not present

## 2013-10-12 DIAGNOSIS — R9389 Abnormal findings on diagnostic imaging of other specified body structures: Secondary | ICD-10-CM | POA: Diagnosis not present

## 2013-10-12 DIAGNOSIS — K3189 Other diseases of stomach and duodenum: Secondary | ICD-10-CM | POA: Diagnosis not present

## 2013-10-12 DIAGNOSIS — Z880 Allergy status to penicillin: Secondary | ICD-10-CM | POA: Diagnosis not present

## 2013-10-16 DIAGNOSIS — R310 Gross hematuria: Secondary | ICD-10-CM | POA: Diagnosis not present

## 2013-12-28 DIAGNOSIS — M069 Rheumatoid arthritis, unspecified: Secondary | ICD-10-CM | POA: Diagnosis not present

## 2013-12-28 DIAGNOSIS — M1990 Unspecified osteoarthritis, unspecified site: Secondary | ICD-10-CM | POA: Diagnosis not present

## 2014-01-11 DIAGNOSIS — M069 Rheumatoid arthritis, unspecified: Secondary | ICD-10-CM | POA: Diagnosis not present

## 2014-01-25 DIAGNOSIS — M069 Rheumatoid arthritis, unspecified: Secondary | ICD-10-CM | POA: Diagnosis not present

## 2014-02-22 DIAGNOSIS — M069 Rheumatoid arthritis, unspecified: Secondary | ICD-10-CM | POA: Diagnosis not present

## 2014-03-29 DIAGNOSIS — M810 Age-related osteoporosis without current pathological fracture: Secondary | ICD-10-CM | POA: Diagnosis not present

## 2014-03-29 DIAGNOSIS — M069 Rheumatoid arthritis, unspecified: Secondary | ICD-10-CM | POA: Diagnosis not present

## 2014-03-29 DIAGNOSIS — M1990 Unspecified osteoarthritis, unspecified site: Secondary | ICD-10-CM | POA: Diagnosis not present

## 2014-04-19 DIAGNOSIS — M069 Rheumatoid arthritis, unspecified: Secondary | ICD-10-CM | POA: Diagnosis not present

## 2014-04-20 DIAGNOSIS — N390 Urinary tract infection, site not specified: Secondary | ICD-10-CM | POA: Diagnosis not present

## 2014-04-20 DIAGNOSIS — I10 Essential (primary) hypertension: Secondary | ICD-10-CM | POA: Diagnosis not present

## 2014-04-28 DIAGNOSIS — R3129 Other microscopic hematuria: Secondary | ICD-10-CM | POA: Diagnosis not present

## 2014-04-28 DIAGNOSIS — R9389 Abnormal findings on diagnostic imaging of other specified body structures: Secondary | ICD-10-CM | POA: Diagnosis not present

## 2014-05-10 ENCOUNTER — Ambulatory Visit: Payer: Self-pay | Admitting: Urology

## 2014-05-10 DIAGNOSIS — K5900 Constipation, unspecified: Secondary | ICD-10-CM | POA: Diagnosis not present

## 2014-05-10 DIAGNOSIS — R319 Hematuria, unspecified: Secondary | ICD-10-CM | POA: Diagnosis not present

## 2014-05-10 DIAGNOSIS — D259 Leiomyoma of uterus, unspecified: Secondary | ICD-10-CM | POA: Diagnosis not present

## 2014-05-10 DIAGNOSIS — Z1389 Encounter for screening for other disorder: Secondary | ICD-10-CM | POA: Diagnosis not present

## 2014-05-10 LAB — CREATININE, SERUM: Creatinine: 0.92 mg/dL (ref 0.60–1.30)

## 2014-05-12 DIAGNOSIS — S92919A Unspecified fracture of unspecified toe(s), initial encounter for closed fracture: Secondary | ICD-10-CM | POA: Diagnosis not present

## 2014-05-12 DIAGNOSIS — L03039 Cellulitis of unspecified toe: Secondary | ICD-10-CM | POA: Diagnosis not present

## 2014-05-12 DIAGNOSIS — L02619 Cutaneous abscess of unspecified foot: Secondary | ICD-10-CM | POA: Diagnosis not present

## 2014-05-12 DIAGNOSIS — M79609 Pain in unspecified limb: Secondary | ICD-10-CM | POA: Diagnosis not present

## 2014-05-13 DIAGNOSIS — R3129 Other microscopic hematuria: Secondary | ICD-10-CM | POA: Diagnosis not present

## 2014-05-26 DIAGNOSIS — G44099 Other trigeminal autonomic cephalgias (TAC), not intractable: Secondary | ICD-10-CM | POA: Diagnosis not present

## 2014-05-26 DIAGNOSIS — I999 Unspecified disorder of circulatory system: Secondary | ICD-10-CM | POA: Diagnosis not present

## 2014-05-27 DIAGNOSIS — M069 Rheumatoid arthritis, unspecified: Secondary | ICD-10-CM | POA: Diagnosis not present

## 2014-06-09 DIAGNOSIS — S92919A Unspecified fracture of unspecified toe(s), initial encounter for closed fracture: Secondary | ICD-10-CM | POA: Diagnosis not present

## 2014-06-17 DIAGNOSIS — M79609 Pain in unspecified limb: Secondary | ICD-10-CM | POA: Diagnosis not present

## 2014-06-17 DIAGNOSIS — M7989 Other specified soft tissue disorders: Secondary | ICD-10-CM | POA: Diagnosis not present

## 2014-06-17 DIAGNOSIS — I10 Essential (primary) hypertension: Secondary | ICD-10-CM | POA: Diagnosis not present

## 2014-06-17 DIAGNOSIS — E785 Hyperlipidemia, unspecified: Secondary | ICD-10-CM | POA: Diagnosis not present

## 2014-06-18 DIAGNOSIS — R312 Other microscopic hematuria: Secondary | ICD-10-CM | POA: Diagnosis not present

## 2014-06-23 ENCOUNTER — Ambulatory Visit: Payer: Self-pay | Admitting: Neurology

## 2014-06-23 DIAGNOSIS — H40003 Preglaucoma, unspecified, bilateral: Secondary | ICD-10-CM | POA: Diagnosis not present

## 2014-06-23 DIAGNOSIS — R51 Headache: Secondary | ICD-10-CM | POA: Diagnosis not present

## 2014-06-24 ENCOUNTER — Ambulatory Visit: Payer: Self-pay | Admitting: Urology

## 2014-06-24 DIAGNOSIS — N3289 Other specified disorders of bladder: Secondary | ICD-10-CM | POA: Diagnosis not present

## 2014-07-02 DIAGNOSIS — M79641 Pain in right hand: Secondary | ICD-10-CM | POA: Diagnosis not present

## 2014-07-02 DIAGNOSIS — M79642 Pain in left hand: Secondary | ICD-10-CM | POA: Diagnosis not present

## 2014-07-02 DIAGNOSIS — M0579 Rheumatoid arthritis with rheumatoid factor of multiple sites without organ or systems involvement: Secondary | ICD-10-CM | POA: Diagnosis not present

## 2014-07-02 DIAGNOSIS — M810 Age-related osteoporosis without current pathological fracture: Secondary | ICD-10-CM | POA: Diagnosis not present

## 2014-07-02 DIAGNOSIS — M159 Polyosteoarthritis, unspecified: Secondary | ICD-10-CM | POA: Diagnosis not present

## 2014-07-15 DIAGNOSIS — E784 Other hyperlipidemia: Secondary | ICD-10-CM | POA: Diagnosis not present

## 2014-07-15 DIAGNOSIS — F5101 Primary insomnia: Secondary | ICD-10-CM | POA: Diagnosis not present

## 2014-07-15 DIAGNOSIS — K219 Gastro-esophageal reflux disease without esophagitis: Secondary | ICD-10-CM | POA: Diagnosis not present

## 2014-07-15 DIAGNOSIS — Z0000 Encounter for general adult medical examination without abnormal findings: Secondary | ICD-10-CM | POA: Diagnosis not present

## 2014-07-15 DIAGNOSIS — F329 Major depressive disorder, single episode, unspecified: Secondary | ICD-10-CM | POA: Diagnosis not present

## 2014-07-15 DIAGNOSIS — N3001 Acute cystitis with hematuria: Secondary | ICD-10-CM | POA: Diagnosis not present

## 2014-07-15 DIAGNOSIS — I10 Essential (primary) hypertension: Secondary | ICD-10-CM | POA: Diagnosis not present

## 2014-07-15 LAB — TSH: TSH: 1.17 u[IU]/mL (ref <=5.90)

## 2014-07-15 LAB — CBC AND DIFFERENTIAL
HCT: 37 % (ref 36–46)
HEMOGLOBIN: 12.5 g/dL (ref 12.0–16.0)

## 2014-07-15 LAB — LIPID PANEL
Cholesterol: 185 mg/dL (ref 0–200)
HDL: 72 mg/dL — AB (ref 35–70)
LDL CALC: 92 mg/dL
TRIGLYCERIDES: 103 mg/dL (ref 40–160)

## 2014-07-15 LAB — BASIC METABOLIC PANEL
BUN: 24 mg/dL — AB (ref 4–21)
CREATININE: 1 mg/dL (ref <=1.1)

## 2014-07-22 DIAGNOSIS — Z23 Encounter for immunization: Secondary | ICD-10-CM | POA: Diagnosis not present

## 2014-07-28 DIAGNOSIS — M0579 Rheumatoid arthritis with rheumatoid factor of multiple sites without organ or systems involvement: Secondary | ICD-10-CM | POA: Diagnosis not present

## 2014-07-28 DIAGNOSIS — M0540 Rheumatoid myopathy with rheumatoid arthritis of unspecified site: Secondary | ICD-10-CM | POA: Diagnosis not present

## 2014-08-03 DIAGNOSIS — M79609 Pain in unspecified limb: Secondary | ICD-10-CM | POA: Diagnosis not present

## 2014-08-03 DIAGNOSIS — M7989 Other specified soft tissue disorders: Secondary | ICD-10-CM | POA: Diagnosis not present

## 2014-08-03 DIAGNOSIS — I8310 Varicose veins of unspecified lower extremity with inflammation: Secondary | ICD-10-CM | POA: Diagnosis not present

## 2014-08-11 DIAGNOSIS — H40003 Preglaucoma, unspecified, bilateral: Secondary | ICD-10-CM | POA: Diagnosis not present

## 2014-08-17 DIAGNOSIS — Z1283 Encounter for screening for malignant neoplasm of skin: Secondary | ICD-10-CM | POA: Diagnosis not present

## 2014-08-17 DIAGNOSIS — D485 Neoplasm of uncertain behavior of skin: Secondary | ICD-10-CM | POA: Diagnosis not present

## 2014-08-17 DIAGNOSIS — Z872 Personal history of diseases of the skin and subcutaneous tissue: Secondary | ICD-10-CM | POA: Diagnosis not present

## 2014-08-17 DIAGNOSIS — L821 Other seborrheic keratosis: Secondary | ICD-10-CM | POA: Diagnosis not present

## 2014-08-24 DIAGNOSIS — H40003 Preglaucoma, unspecified, bilateral: Secondary | ICD-10-CM | POA: Diagnosis not present

## 2014-09-11 LAB — HM MAMMOGRAPHY: HM MAMMO: NORMAL

## 2014-09-23 ENCOUNTER — Ambulatory Visit: Payer: Self-pay | Admitting: Internal Medicine

## 2014-12-28 ENCOUNTER — Ambulatory Visit
Admit: 2014-12-28 | Disposition: A | Discharge status: Home / Self Care | Payer: Self-pay | Attending: Urology | Admitting: Urology

## 2015-01-01 NOTE — Op Note (Signed)
PATIENT NAME:  Caroline Conway, Caroline Conway MR#:  [id number] DATE OF BIRTH:  04/06/1941  DATE OF PROCEDURE:  10/12/2013  PREOPERATIVE DIAGNOSES: Possible filling defect distal right ureter, gross hematuria.   POSTOPERATIVE DIAGNOSES: Possible filling defect distal right ureter, gross hematuria, with a stricture in distal right ureter. No tumor.   PROCEDURE: Right ureteroscopy, right retrograde pyelogram and cystoscopy with stent.   PROCEDURE DETAILS: As follows: With the patient sterilely prepped and draped in supine lithotomy position for ease of approach of the external genitalia, the procedure was begun. The patient had a cystoscopy done. The bladder shows no evidence of tumor, mass or growth. Distal ureter was easily seen on the right, instrumented with an 0.038 Glidewire. Just before we did the Glidewire, we did a right retrograde. Saw no major filling defects. The right retrograde catheter was an open-ended right ureteral catheter 5-French in size. Through this, the Glidewire was placed up into the kidney. Next, to the Sheldon, I did take a long telescoping 7-French ureteroscope to look up the ureter. There was a stricture distally and that is probably what was seen on x-ray. Over a wire, I can get the scope up, so it is not a grossly bad stricture that needs to be dilated. I viewed the ureter throughout its length, and I find no tumors, masses or growths. There is some irritation distally, but it is not tumorous in nature. So, at this point, unless she has severe gross hematuria in the future and growing mass, I am not going to biopsy anything because there is nothing really to biopsy. So, I leave a 6-French 26 cm stent in place, one end curled in the bladder and one end curled in the kidney and strings attached. She tolerates the procedure well. Sent to recovery in satisfactory condition with an empty bladder, 30 mL of 0.5% Marcaine in the bladder and a B and O suppository.     ____________________________ Janice Coffin. Elnoria Howard, DO rdh:gb D: 10/12/2013 15:58:58 ET T: 10/13/2013 02:28:07 ET JOB#: [id number]  cc: Janice Coffin. Elnoria Howard, DO, <Dictator> Samamtha Tiegs D Loyda Costin DO ELECTRONICALLY SIGNED 11/13/2013 14:22

## 2015-01-29 ENCOUNTER — Encounter: Payer: Self-pay | Admitting: Internal Medicine

## 2015-01-29 DIAGNOSIS — N952 Postmenopausal atrophic vaginitis: Secondary | ICD-10-CM | POA: Insufficient documentation

## 2015-01-29 DIAGNOSIS — F324 Major depressive disorder, single episode, in partial remission: Secondary | ICD-10-CM | POA: Insufficient documentation

## 2015-01-29 DIAGNOSIS — R319 Hematuria, unspecified: Secondary | ICD-10-CM | POA: Insufficient documentation

## 2015-01-29 DIAGNOSIS — E785 Hyperlipidemia, unspecified: Secondary | ICD-10-CM | POA: Insufficient documentation

## 2015-01-29 DIAGNOSIS — F5101 Primary insomnia: Secondary | ICD-10-CM | POA: Insufficient documentation

## 2015-01-29 DIAGNOSIS — F419 Anxiety disorder, unspecified: Secondary | ICD-10-CM | POA: Insufficient documentation

## 2015-01-29 DIAGNOSIS — N8110 Cystocele, unspecified: Secondary | ICD-10-CM | POA: Insufficient documentation

## 2015-01-29 DIAGNOSIS — K219 Gastro-esophageal reflux disease without esophagitis: Secondary | ICD-10-CM | POA: Insufficient documentation

## 2015-01-29 DIAGNOSIS — N3941 Urge incontinence: Secondary | ICD-10-CM | POA: Insufficient documentation

## 2015-01-29 DIAGNOSIS — M069 Rheumatoid arthritis, unspecified: Secondary | ICD-10-CM | POA: Insufficient documentation

## 2015-01-29 DIAGNOSIS — G609 Hereditary and idiopathic neuropathy, unspecified: Secondary | ICD-10-CM | POA: Insufficient documentation

## 2015-01-29 DIAGNOSIS — I10 Essential (primary) hypertension: Secondary | ICD-10-CM | POA: Insufficient documentation

## 2015-01-29 HISTORY — DX: Major depressive disorder, single episode, in partial remission: F32.4

## 2015-03-21 ENCOUNTER — Ambulatory Visit
Admit: 2015-03-21 | Discharge: 2015-03-21 | Disposition: A | Discharge status: Home / Self Care | Payer: PPO | Attending: Internal Medicine | Admitting: Internal Medicine

## 2015-03-21 ENCOUNTER — Ambulatory Visit: Payer: PPO

## 2015-03-21 ENCOUNTER — Ambulatory Visit
Admission: EM | Admit: 2015-03-21 | Discharge: 2015-03-21 | Disposition: A | Discharge status: Home / Self Care | Payer: PPO | Attending: Internal Medicine | Admitting: Internal Medicine

## 2015-03-21 DIAGNOSIS — S66911A Strain of unspecified muscle, fascia and tendon at wrist and hand level, right hand, initial encounter: Secondary | ICD-10-CM

## 2015-03-21 DIAGNOSIS — J341 Cyst and mucocele of nose and nasal sinus: Secondary | ICD-10-CM | POA: Diagnosis not present

## 2015-03-21 DIAGNOSIS — W19XXXA Unspecified fall, initial encounter: Secondary | ICD-10-CM | POA: Insufficient documentation

## 2015-03-21 DIAGNOSIS — S0993XA Unspecified injury of face, initial encounter: Secondary | ICD-10-CM

## 2015-03-21 DIAGNOSIS — S62514A Nondisplaced fracture of proximal phalanx of right thumb, initial encounter for closed fracture: Secondary | ICD-10-CM | POA: Insufficient documentation

## 2015-03-21 DIAGNOSIS — S0990XA Unspecified injury of head, initial encounter: Secondary | ICD-10-CM

## 2015-03-21 DIAGNOSIS — Y92009 Unspecified place in unspecified non-institutional (private) residence as the place of occurrence of the external cause: Secondary | ICD-10-CM | POA: Diagnosis not present

## 2015-03-21 DIAGNOSIS — S62501A Fracture of unspecified phalanx of right thumb, initial encounter for closed fracture: Secondary | ICD-10-CM

## 2015-03-21 DIAGNOSIS — M25531 Pain in right wrist: Secondary | ICD-10-CM | POA: Diagnosis present

## 2015-03-21 HISTORY — DX: Polyneuropathy, unspecified: G62.9

## 2015-03-21 HISTORY — DX: Rheumatoid vasculitis with rheumatoid arthritis of unspecified site: M05.20

## 2015-03-21 HISTORY — DX: Essential (primary) hypertension: I10

## 2015-03-21 MED ORDER — ACETAMINOPHEN 500 MG PO TABS
500.0000 mg | ORAL_TABLET | Freq: Once | ORAL | Status: AC
  Administered 2015-03-21: 500 mg via ORAL

## 2015-03-21 NOTE — ED Notes (Signed)
Pt states "pt states I fell at home about 2 hours ago, tripped on rocks, hit my nose on the corner of [place]. It hurt my nose, I want to make sure it's not broken, and my left wrist and thumb. I have an abrasion on my both knees and my nose." Pt denies LOC. Alert and oriented at present.

## 2015-03-21 NOTE — Discharge Instructions (Signed)
Please report directly to [facility] imaging center that you are familiar with for CT of Head and CT of Facial bones without contrast...they will call us with the answer so please wait when finished  Please contact [facility] for follow up on your thumb fracture. It is important that you have it evaluated for possible additional care. In the meantime your splint is 24/7  ALL   the time  !!  Replace a wt one with the clean one and let the first one dry again... I have attached the report for your thumb film for Ortho use-they can access if part of the [facility] system  Thank you for choosing [place] for your care today !!   Jan Fireman PA-C   .Finger Fracture Fractures of fingers are breaks in the bones of the fingers. There are many types of fractures. There are different ways of treating these fractures. Your health care provider will discuss the best way to treat your fracture. CAUSES Traumatic injury is the main cause of broken fingers. These include:  Injuries while playing sports.  Workplace injuries.  Falls. RISK FACTORS Activities that can increase your risk of finger fractures include:  Sports.  Workplace activities that involve machinery.  A condition called osteoporosis, which can make your bones less dense and cause them to fracture more easily. SIGNS AND SYMPTOMS The main symptoms of a broken finger are pain and swelling within 15 minutes after the injury. Other symptoms include:  Bruising of your finger.  Stiffness of your finger.  Numbness of your finger.  Exposed bones (compound fracture) if the fracture is severe. DIAGNOSIS  The best way to diagnose a broken bone is with X-ray imaging. Additionally, your health care provider will use this X-ray image to evaluate the position of the broken finger bones.  TREATMENT  Finger fractures can be treated with:   Nonreduction--This means the bones are in place. The finger is splinted without changing the  positions of the bone pieces. The splint is usually left on for about a week to 10 days. This will depend on your fracture and what your health care provider thinks.  Closed reduction--The bones are put back into position without using surgery. The finger is then splinted.  Open reduction and internal fixation--The fracture site is opened. Then the bone pieces are fixed into place with pins or some type of hardware. This is seldom required. It depends on the severity of the fracture. HOME CARE INSTRUCTIONS   Follow your health care provider's instructions regarding activities, exercises, and physical therapy.  Only take over-the-counter or prescription medicines for pain, discomfort, or fever as directed by your health care provider. SEEK MEDICAL CARE IF: You have pain or swelling that limits the motion or use of your fingers. SEEK IMMEDIATE MEDICAL CARE IF:  Your finger becomes numb. MAKE SURE YOU:   Understand these instructions.  Will watch your condition.  Will get help right away if you are not doing well or get worse. Document Released: 12/09/2000 Document Revised: 06/17/2013 Document Reviewed: 04/08/2013 [facility] Patient Information 2015 [place], [place]. This information is not intended to replace advice given to you by your health care provider. Make sure you discuss any questions you have with your health care provider.

## 2015-03-21 NOTE — ED Notes (Signed)
Correction in nursing note. Extremity is right thumb, and wrist.

## 2015-03-21 NOTE — ED Provider Notes (Signed)
CSN: [id number]     Arrival date & time 03/21/15  1323 History   None    Chief Complaint  Patient presents with  . Fall  . Facial Injury  . Wrist Pain  . Finger Injury  . Abrasion   (Consider location/radiation/quality/duration/timing/severity/associated sxs/prior Treatment) HPI   74 yo F returned from exercise class and was moving around automobile  when she tripped. Face planted on edge of cement parking platform with bridge of nose. Reports bleeding from both nostrils, abraded and bruised nose. Denies visual changes headache or neckpain. Caught right hand and wrist underneath her with resultant right wrist and right thumb pain. Both knees fell into the gravel and are abraded. History left knee replacement/bilateral shoulder surgery and lumbar rod placement. Rolled herself over to a grassy area and was able to rise without assistance..  Ambulatory at the scene . Went into the house and cleaned face, changed clothes and drove herself here. She denies any discomfort other than as mentioned. Specifically denies  LOC,  Denies being stunned, denies numbness or tingling peripherally- furious with herself for what she "not paying close enough attention"- preoccupied with her "to Do " list.Recently widowed early this year and home alone. Sister presented at [facility] . One son out of town this week, other son lives in [place]. Past Medical History  Diagnosis Date  . Hypertension   . Neuropathy   . Rheumatoid arteritis    Past Surgical History  Procedure Laterality Date  . Total knee arthroplasty Left 2011  . Riight shoulder surgery Right 1999  . Shoulder surgery Left 2006  . Lumbar disc surgery  2011  . Appendectomy  1960   Family History  Problem Relation Age of Onset  . Alzheimer's disease Mother   . Cancer Father    History  Substance Use Topics  . Smoking status: Never Smoker   . Smokeless tobacco: Not on file  . Alcohol Use: No   OB History    No data available     Review of  Systems Constitutional: No fever.  Eyes: No visual changes. ENT:No sore throat.Known deviated septum Cardiovascular:Negative for chest pain/palpitations Respiratory: Negative for shortness of breath Gastrointestinal: No abdominal pain. No nausea,vomiting, diarrhea Genitourinary: Negative for dysuria. Normal urination. Musculoskeletal: Negative for back pain. FROM extremities without pain except right wrist and thumb, bilateral knee abrasions mild. Ambulatory without assistance Skin: Negative for rash Neurological: Negative for headache, focal weakness or numbness. She denied numbness or tingling, thought disruption, visual change, headache or pain anywhere except bridge of nose and right thumb.  Allergies  Codeine sulfate; Oxycodone; Penicillins; and Sulfa antibiotics  Home Medications   Prior to Admission medications   Medication Sig Start Date End Date Taking? Authorizing Provider  atenolol (TENORMIN) 50 MG tablet Take 1 tablet by mouth daily. 06/10/14  Yes Historical Provider, MD  citalopram (CELEXA) 10 MG tablet Take 1 tablet by mouth daily. 07/15/14  Yes Historical Provider, MD  conjugated estrogens (PREMARIN) vaginal cream Place 1 application vaginally 2 (two) times a week. 08/18/14  Yes Historical Provider, MD  esomeprazole (NEXIUM) 20 MG capsule Take 1 capsule by mouth daily.   Yes Historical Provider, MD  folic acid (FOLVITE) 1 MG tablet Take 1 tablet by mouth daily.   Yes Historical Provider, MD  gabapentin (NEURONTIN) 300 MG capsule Take 3 capsules by mouth daily. 1 in AM and 2 at HS   Yes Historical Provider, MD  hydrochlorothiazide (HYDRODIURIL) 25 MG tablet Take 1 tablet by mouth  daily. 07/15/14  Yes Historical Provider, MD  meloxicam (MOBIC) 15 MG tablet Take 1 tablet by mouth daily.   Yes Historical Provider, MD  methotrexate 2.5 MG tablet Take 8 tablets by mouth once a week.   Yes Historical Provider, MD  mirtazapine (REMERON) 15 MG tablet Take 1 tablet by mouth at bedtime.  07/15/14  Yes Historical Provider, MD  simvastatin (ZOCOR) 20 MG tablet Take 1 tablet by mouth at bedtime. 07/15/14  Yes Historical Provider, MD  zolpidem (AMBIEN) 5 MG tablet Take 1 tablet by mouth at bedtime. 07/15/14  Yes Historical Provider, MD   BP 116/70 mmHg  Pulse 60  Temp(Src) 98.6 F (37 C) (Tympanic)  Resp 16  Ht 5\' 1"  (1.549 m)  Wt 159 lb (72.122 kg)  BMI 30.06 kg/m2  SpO2 99% Physical Exam   Constitutional -alert and oriented,well appearing except abraded, bruised and swollen nasal bridge,  Guarding right hand and wrist Head- normocephalic Eyes- conjunctiva normal, EOMI ,conjugate gaze Ears- canals clear, TMs neg Nose- evidence of previous nasal bleeding , currently  stopped. Bridge very tender, edema, ecchymosis Mouth/throat- mucous membranes moist ,oropharynx non-erythematous, teeth intact Neck- supple, denies pain,  without glandular enlargement CV- regular rate, grossly normal heart sounds, G 1-2/6 SEM Resp-no distress, normal respiratory effort,clear to auscultation bilaterally GI- soft,non-tender,no distention GU- not examined MSK- right wrist and right thumb discomfort. Right thumb  Ecchymotic and swollen with decreased ROM. Extremities otherwise normal ROM, very mild abrasions bilateral knees. Left with surgical scar. Good ROM w/o crepitus or pain. Ambulatory, self-care accomplished.  Neuro- normal speech and language, no gross focal neurological deficit appreciated, no gait instability, CNS as tested were all WNL Skin-warm,dry ,minor abrasions bilateral knees, nasal bridge Psych-mood and affect grossly normal; speech and behavior grossly normal ED Course  Procedures (including critical care time) Labs Review Labs Reviewed - No data to display  Imaging Review  Films failed to drop - Right wrist without evidence of fracture Right thumb acute non-displaced intra-articular fracture of distal phalanx   Xrays discussed with patient- Right thumb placed in a frog  Hug splint supporting distal segment and DIP joint. She is given a disc of the film and the written report and is to consult with Orthopedics ASAP this week. She has connections with Ortho that cared for her knee replacement and wishes to make her own  Arrangements.  MDM   1. Head trauma, initial encounter   2. Facial injury, initial encounter   3. Wrist strain, right, initial encounter   4. Thumb fracture, right, closed, initial encounter     Plan: 1. Test/x-ray results and diagnosis reviewed with patient and her sister. She is splinted and is given info to hand carry to Ortho to review for care of her thumb, Strongly encouraged to leave thumb in splint , to ice and elevate and to continue her Mobic. 2. CT is not available at our facility today but she is counselled to have facial bones and head scanned. Sister is here to transport her and they are both in agreement to travel to [facility] screening. STAT  Appointment  is set up  3. Head trauma precautions were reviewed with both and they express understanding. Patient states that she will agree to follow up care if her sister deems it appropriate or if she herself has concerns. 4. Recommend supportive treatment with ice packs to right thumb and skin protected to nasal bridge, 20 minute cycles. Rx o abraded areas with triple antibiotic ointment of  choice applied very lightly - keep tissue hydrated without letting it melt into eyes., include knees. 5. Report to PCP triage nurse tomorrow to update personal chart 4. F/u prn if symptoms worsen or don't improve- or present to the ER of her choice with any concerns- head injury issues again reviewed.  Addendum- Patient and her sister went to [facility] and had CT head/face. I received a called report that both were negative for fracture or acute  intercranial findings. Ms McAlsiter was placed on the phone so I could communicate the information to her. Head trauma precautions reviewed. RTC [facility] or ER of  choice with questions or concerns. To Ortho for evaluation ASAP it can be arranged.   Jan Fireman, PA-C 03/23/15 [id number]

## 2015-04-09 ENCOUNTER — Other Ambulatory Visit: Payer: Self-pay | Admitting: Internal Medicine

## 2015-04-11 HISTORY — PX: VAGINAL HYSTERECTOMY: SUR661

## 2015-04-11 HISTORY — PX: ANTERIOR AND POSTERIOR VAGINAL REPAIR: SUR5

## 2015-04-12 ENCOUNTER — Other Ambulatory Visit: Payer: Self-pay | Admitting: Internal Medicine

## 2015-04-18 ENCOUNTER — Other Ambulatory Visit: Payer: Self-pay

## 2015-04-19 MED ORDER — ZOLPIDEM TARTRATE 5 MG PO TABS: 5.0000 mg | ORAL_TABLET | Freq: Every day | ORAL | Status: DC

## 2015-05-03 ENCOUNTER — Other Ambulatory Visit: Payer: Self-pay | Admitting: Internal Medicine

## 2015-05-12 ENCOUNTER — Ambulatory Visit (INDEPENDENT_AMBULATORY_CARE_PROVIDER_SITE_OTHER): Payer: PPO | Admitting: Internal Medicine

## 2015-05-12 ENCOUNTER — Other Ambulatory Visit: Payer: Self-pay | Admitting: Internal Medicine

## 2015-05-12 ENCOUNTER — Encounter: Payer: Self-pay | Admitting: Internal Medicine

## 2015-05-12 VITALS — BP 128/64 | HR 64 | Ht 60.0 in | Wt 165.2 lb

## 2015-05-12 DIAGNOSIS — E871 Hypo-osmolality and hyponatremia: Secondary | ICD-10-CM

## 2015-05-12 NOTE — Progress Notes (Signed)
Date:  05/12/2015   Name:  Caroline Conway   DOB:  11-26-1940   MRN:  [id number]   Chief Complaint: Abnormal Lab patient had surgery at [facility] in late July. On pre-op labs her sodium was 130. She was instructed to eat more sodium in her diet. They suggested that she return here to have that rechecked. She reports in taking a moderate amount of sodium-was never a high sodium intake person. She drinks a moderate amount of water and has restricted that somewhat since her surgery.  Review of Systems:  Review of Systems  Constitutional: Positive for fatigue. Negative for fever and unexpected weight change.  HENT: Negative for hearing loss.   Respiratory: Negative for choking, chest tightness and shortness of breath.   Cardiovascular: Negative for chest pain, palpitations and leg swelling.  Gastrointestinal: Negative for constipation.  Genitourinary: Negative for dysuria, hematuria and pelvic pain.  Neurological: Negative for dizziness, light-headedness and headaches.    Patient Active Problem List   Diagnosis Date Noted  . Anxiety disorder 01/29/2015  . Atrophic vaginitis 01/29/2015  . Dyslipidemia 01/29/2015  . Essential (primary) hypertension 01/29/2015  . Acid reflux 01/29/2015  . Blood in the urine 01/29/2015  . Depression, major, single episode, in partial remission 01/29/2015  . Idiopathic peripheral neuropathy 01/29/2015  . Idiopathic insomnia 01/29/2015  . Rheumatoid arthritis involving multiple joints 01/29/2015  . Urge incontinence 01/29/2015  . Pelvic relaxation due to vaginal prolapse 01/29/2015    Prior to Admission medications   Medication Sig Start Date End Date Taking? Authorizing Provider  atenolol (TENORMIN) 50 MG tablet Take 1 tablet by mouth daily. 06/10/14  Yes Historical Provider, MD  citalopram (CELEXA) 10 MG tablet Take 1 tablet by mouth daily. 07/15/14  Yes Historical Provider, MD  conjugated estrogens (PREMARIN) vaginal cream Place 1 application vaginally 2 (two)  times a week. 08/18/14  Yes Historical Provider, MD  esomeprazole (NEXIUM) 20 MG capsule Take 1 capsule by mouth daily.   Yes Historical Provider, MD  folic acid (FOLVITE) 1 MG tablet Take 1 tablet by mouth daily.   Yes Historical Provider, MD  gabapentin (NEURONTIN) 100 MG capsule TAKE 1 TABLET IN AM AND 2 TABLETS AT NIGHT 04/27/15  Yes Historical Provider, MD  hydrochlorothiazide (HYDRODIURIL) 25 MG tablet Take 1 tablet by mouth daily. 07/15/14  Yes Historical Provider, MD  ibuprofen (ADVIL,MOTRIN) 600 MG tablet TAKE 1 TABLET (600 MG TOTAL) BY MOUTH EVERY 6 (SIX) HOURS AS NEEDED FOR PAIN. 04/05/15  Yes Historical Provider, MD  meloxicam (MOBIC) 15 MG tablet Take 1 tablet by mouth daily.   Yes Historical Provider, MD  methotrexate 2.5 MG tablet Take 8 tablets by mouth once a week.   Yes Historical Provider, MD  mirtazapine (REMERON) 30 MG tablet TAKE 1 TABLET BY MOUTH AT BEDTIME 05/03/15  Yes Glean Hess, MD  simvastatin (ZOCOR) 20 MG tablet Take 1 tablet by mouth at bedtime. 07/15/14  Yes Historical Provider, MD  zolpidem (AMBIEN) 5 MG tablet Take 1 tablet (5 mg total) by mouth at bedtime. 04/19/15  Yes Glean Hess, MD  gabapentin (NEURONTIN) 300 MG capsule Take 3 capsules by mouth daily. 1 in AM and 2 at [facility]    Historical Provider, MD  mirtazapine (REMERON) 15 MG tablet Take 1 tablet by mouth at bedtime. 07/15/14   Historical Provider, MD  nitrofurantoin, macrocrystal-monohydrate, (MACROBID) 100 MG capsule TAKE 1 CAPSULE (100 MG TOTAL) BY MOUTH EVERY 12 (TWELVE) HOURS. 04/08/15   Historical Provider, MD  oxyCODONE (  OXY IR/ROXICODONE) 5 MG immediate release tablet TAKE 1 TABLET BY MOUTH EVERY 4HRS AS NEEDED FOR PAIN 04/11/15   Historical Provider, MD    Allergies  Allergen Reactions  . Codeine Sulfate Hives  . Oxycodone Itching  . Penicillins Rash  . Sulfa Antibiotics Rash    Past Surgical History  Procedure Laterality Date  . Total knee arthroplasty Left 2011  . Riight shoulder surgery  Right 1999  . Shoulder surgery Left 2006  . Lumbar disc surgery  2011  . Appendectomy  1960    Social History  Substance Use Topics  . Smoking status: Never Smoker   . Smokeless tobacco: None  . Alcohol Use: No     Medication list has been reviewed and updated.  Physical Examination:  Physical Exam  Constitutional: She is oriented to person, place, and time. She appears well-developed. No distress.  HENT:  Head: Normocephalic and atraumatic.  Eyes: Conjunctivae are normal. Right eye exhibits no discharge. Left eye exhibits no discharge. No scleral icterus.  Cardiovascular: Normal rate, regular rhythm and S1 normal.   Pulmonary/Chest: Effort normal and breath sounds normal. No respiratory distress.  Musculoskeletal: Normal range of motion. She exhibits no edema or tenderness.  Neurological: She is alert and oriented to person, place, and time.  Skin: Skin is warm and dry. No rash noted.  Psychiatric: She has a normal mood and affect. Her behavior is normal. Thought content normal.    BP 128/64 mmHg  Pulse 64  Ht 5' (1.524 m)  Wt 165 lb 3.2 oz (74.934 kg)  BMI 32.26 kg/m2  Assessment and Plan: 1. Hyponatremia Will contact patient on further testing or treatment once labs return. - Basic metabolic panel   Halina Maidens, MD [facility]  05/12/2015

## 2015-05-13 LAB — BASIC METABOLIC PANEL
BUN/Creatinine Ratio: 27 — ABNORMAL HIGH (ref 11–26)
BUN: 24 mg/dL (ref 8–27)
CALCIUM: 9.6 mg/dL (ref 8.7–10.3)
CHLORIDE: 98 mmol/L (ref 97–108)
CO2: 25 mmol/L (ref 18–29)
CREATININE: 0.88 mg/dL (ref 0.57–1.00)
GFR calc Af Amer: 75 mL/min/{1.73_m2} (ref >59)
GFR calc non Af Amer: 65 mL/min/{1.73_m2} (ref >59)
GLUCOSE: 93 mg/dL (ref 65–99)
Potassium: 4.4 mmol/L (ref 3.5–5.2)
Sodium: 139 mmol/L (ref 134–144)

## 2015-06-29 ENCOUNTER — Other Ambulatory Visit: Payer: Self-pay | Admitting: Internal Medicine

## 2015-06-30 ENCOUNTER — Encounter: Payer: Self-pay | Admitting: Physical Therapy

## 2015-06-30 ENCOUNTER — Ambulatory Visit: Payer: PPO | Attending: Neurology | Admitting: Physical Therapy

## 2015-06-30 DIAGNOSIS — R2681 Unsteadiness on feet: Secondary | ICD-10-CM | POA: Diagnosis present

## 2015-06-30 DIAGNOSIS — R262 Difficulty in walking, not elsewhere classified: Secondary | ICD-10-CM | POA: Diagnosis present

## 2015-06-30 DIAGNOSIS — R202 Paresthesia of skin: Secondary | ICD-10-CM | POA: Diagnosis present

## 2015-06-30 DIAGNOSIS — R200 Anesthesia of skin: Secondary | ICD-10-CM

## 2015-06-30 NOTE — Therapy (Signed)
[place] MAIN [facility] SERVICES [address] [place], [place], [address] Phone: [phone]   Fax:  [phone]  Physical Therapy Evaluation  Patient Details  Name: Caroline Conway MRN: [id number] Date of Birth: 03/02/1941 Referring Provider: Manuella Ghazi  Encounter Date: 06/30/2015    Past Medical History  Diagnosis Date  . Hypertension   . Neuropathy ([place])   . Rheumatoid arteritis   . H/O total hysterectomy     Past Surgical History  Procedure Laterality Date  . Total knee arthroplasty Left 2011  . Riight shoulder surgery Right 1999  . Shoulder surgery Left 2006  . Lumbar disc surgery  2011  . Appendectomy  1960  . Vaginal hysterectomy  04/2015  . Anterior and posterior vaginal repair  04/2015    There were no vitals filed for this visit.  Visit Diagnosis:  No diagnosis found.      Subjective Assessment - 06/30/15 1000    Subjective Patient has had a fall and is unsteady with her gait.    Currently in Pain? No/denies            [facility] PT Assessment - 06/30/15 1002    Assessment   Medical Diagnosis peripheral neuropahty   Referring Provider shah   Onset Date/Surgical Date 06/26/15   Hand Dominance Right   Next MD Visit 1/17   Prior Therapy --  She had a TKR and went to [facility] for rehab   Precautions   Precautions Fall   Balance Screen   Has the patient fallen in the past 6 months Yes   How many times? 2   Has the patient had a decrease in activity level because of a fear of falling?  Yes   Is the patient reluctant to leave their home because of a fear of falling?  No   Home Environment   Living Environment Private residence   Living Arrangements Alone   Available Help at Discharge Family   Type of [place] to enter   Entrance Stairs-Number of Steps 6   Entrance Stairs-Rails Left   Home Layout Two level   Alternate Level Stairs-Number of Steps 12   Alternate Level Stairs-Rails Can reach  both   [employer] - single point;Walker - 2 wheels;Grab bars - tub/shower   Prior Function   Level of Independence Independent   Vocation Retired       PAIN: no pain  POSTURE: WFL   PROM/AROM: WFL  STRENGTH:  Graded on a 0-5 scale Muscle Group Left Right  Shoulder flex    Shoulder Abd    Shoulder Ext    Shoulder IR/ER    Elbow    Wrist/hand    Hip Flex 3 3  Hip Abd 3 3  Hip Add 2 2  Hip Ext 2 2  Hip IR/ER    Knee Flex 3 3  Knee Ext 3 3  Ankle DF 4 4  Ankle PF     SENSATION: numbness BLE feet and calfs      FUNCTIONAL MOBILITY: WNL  BALANCE: unable to single leg stand, unable to tandem stand, 3 inch functional reach   GAIT: Ambulates with spc with slow gait speed  OUTCOME MEASURES: TEST Outcome Interpretation  5 times sit<>stand 18.24sec >74 yo, >15 sec indicates increased risk for falls  10 meter walk test    .95              m/s <1.0 m/s  indicates increased risk for falls; limited community ambulator  Timed up and Go    11.80             sec <14 sec indicates increased risk for falls  6 minute walk test 800               Feet 1000 feet is community Conservator, museum/gallery Assessment 43/56[-'-[ <36/56 (100% risk for falls), 37-45 (80% risk for falls); 46-51 (>50% risk for falls); 52-55 (lower risk <25% of falls)      Theraeputic exercise: 4 way hip bilaterally with YTb x 10 Corner modified tandem and head turns x 10 PT provided min - moderate verbal instruction to improve set up, proper use of LE, and improved posture and gait mechanics. Patient responded moderately to instruction                               Problem List Patient Active Problem List   Diagnosis Date Noted  . Anxiety disorder 01/29/2015  . Atrophic vaginitis 01/29/2015  . Dyslipidemia 01/29/2015  . Essential (primary) hypertension 01/29/2015  . Acid reflux 01/29/2015  . Blood in the urine 01/29/2015  . Depression, major, single episode, in  partial remission ([place]) 01/29/2015  . Idiopathic peripheral neuropathy ([place]) 01/29/2015  . Idiopathic insomnia 01/29/2015  . Rheumatoid arthritis involving multiple joints ([place]) 01/29/2015  . Urge incontinence 01/29/2015  . Pelvic relaxation due to vaginal prolapse 01/29/2015    [name] 06/30/2015, 10:08 AM  [place] MAIN [facility] SERVICES [address] [place], [place], [address] Phone: [phone]   Fax:  [phone]  Name: Caroline Conway MRN: [id number] Date of Birth: 1940/12/03

## 2015-07-04 ENCOUNTER — Ambulatory Visit: Payer: PPO

## 2015-07-06 ENCOUNTER — Ambulatory Visit: Payer: PPO | Admitting: Physical Therapy

## 2015-07-06 ENCOUNTER — Encounter: Payer: Self-pay | Admitting: Physical Therapy

## 2015-07-06 DIAGNOSIS — R2681 Unsteadiness on feet: Secondary | ICD-10-CM

## 2015-07-06 DIAGNOSIS — R262 Difficulty in walking, not elsewhere classified: Secondary | ICD-10-CM | POA: Diagnosis not present

## 2015-07-06 DIAGNOSIS — R202 Paresthesia of skin: Secondary | ICD-10-CM

## 2015-07-06 DIAGNOSIS — R200 Anesthesia of skin: Secondary | ICD-10-CM

## 2015-07-06 NOTE — Therapy (Signed)
[place] MAIN [facility] SERVICES [address] [place], [place], [address] Phone: [phone]   Fax:  [phone]  Physical Therapy Treatment  Patient Details  Name: Caroline Conway MRN: [id number] Date of Birth: 1940/10/15 Referring Provider: shah  Encounter Date: 07/06/2015      PT End of Session - 07/06/15 1037    Visit Number 2   Number of Visits 17   Date for PT Re-Evaluation 2015-09-05   Authorization Type g codes 1   Equipment Utilized During Treatment Gait belt   Activity Tolerance Patient tolerated treatment well;Patient limited by fatigue   Behavior During Therapy [facility] for tasks assessed/performed      Past Medical History  Diagnosis Date  . Hypertension   . Neuropathy ([place])   . Rheumatoid arteritis   . H/O total hysterectomy     Past Surgical History  Procedure Laterality Date  . Total knee arthroplasty Left 2011  . Riight shoulder surgery Right 1999  . Shoulder surgery Left 2006  . Lumbar disc surgery  2011  . Appendectomy  1960  . Vaginal hysterectomy  04/2015  . Anterior and posterior vaginal repair  04/2015    There were no vitals filed for this visit.  Visit Diagnosis:  Difficulty walking  Numbness and tingling  Unsteady gait      Subjective Assessment - 07/06/15 1036    Subjective Patient has had a fall and is unsteady with her gait.    Currently in Pain? No/denies      standing hip abd , flex, extension with YTB x 20  side stepping left and right in parallel bars 10 feet x 3 standing on blue foam with cone reaching x 20 across midline step ups from floor to 6 inch stool x 20 bilateral sit to stand x 10 marching in parallel bars x 20 stepping pattern with weight shifting fwd/bwd x 10.   CGA and Min to mod verbal cues used throughout with increased in postural sway and LOB most seen with narrow base of support and while on uneven surfaces. Continues to have balance deficits typical with diagnosis.  Patient performs intermediate level exercises without pain behaviors and needs verbal cuing for postural alignment and head positioning Tactile cues and assistance needed to keep lower leg and knee in neutral to avoid compensations with ankle motions.                          PT Education - 07/06/15 1036    Education provided Yes   Education Details HEP   Person(s) Educated Patient   Methods Explanation   Comprehension Verbalized understanding             PT Long Term Goals - 06/30/15 1017    PT LONG TERM GOAL #1   Title Patient will be independent in home exercise program to improve strength/mobility for better functional independence with ADLs 09/05/15   PT LONG TERM GOAL #2   Title Patient (> 55 years old) will complete five times sit to stand test in < 15 seconds indicating an increased LE strength and improved balance. 2015/09/05   PT LONG TERM GOAL #3   Title . Patient will increase 10 meter walk test to >1.58m/s as to improve gait speed for better community ambulation and to reduce fall risk 09-05-2015   PT LONG TERM GOAL #4   Title Patient will be independent with ascend/descend 12 steps using single UE in step  over step pattern without LOB. 08/25/15               Plan - 07/06/15 1037    Clinical Impression Statement Patient has weakness in BLE hips and knees and decreased standing dynamic balance and unsteady gait.  PT provided min - moderate verbal instruction to improve set up, proper use of UE, and improved posture and gait mechanics. Patient responded moderately to instruction   Pt will benefit from skilled therapeutic intervention in order to improve on the following deficits Abnormal gait;Difficulty walking;Impaired sensation;Decreased strength;Decreased mobility;Decreased balance;Decreased activity tolerance   Rehab Potential Fair   PT Frequency 2x / week   PT Duration 8 weeks   PT Treatment/Interventions Stair training;Gait  training;Neuromuscular re-education;Therapeutic exercise;Therapeutic activities   Consulted and Agree with Plan of Care Patient        Problem List Patient Active Problem List   Diagnosis Date Noted  . Anxiety disorder 01/29/2015  . Atrophic vaginitis 01/29/2015  . Dyslipidemia 01/29/2015  . Essential (primary) hypertension 01/29/2015  . Acid reflux 01/29/2015  . Blood in the urine 01/29/2015  . Depression, major, single episode, in partial remission ([place]) 01/29/2015  . Idiopathic peripheral neuropathy ([place]) 01/29/2015  . Idiopathic insomnia 01/29/2015  . Rheumatoid arthritis involving multiple joints ([place]) 01/29/2015  . Urge incontinence 01/29/2015  . Pelvic relaxation due to vaginal prolapse 01/29/2015    [name] 07/06/2015, 10:48 AM  [place] MAIN [facility] SERVICES [address] [place], [place], [address] Phone: [phone]   Fax:  [phone]  Name: Caroline Conway MRN: [id number] Date of Birth: 02-06-1941

## 2015-07-07 ENCOUNTER — Encounter: Payer: Self-pay | Admitting: Physical Therapy

## 2015-07-07 ENCOUNTER — Ambulatory Visit (INDEPENDENT_AMBULATORY_CARE_PROVIDER_SITE_OTHER): Payer: PPO | Admitting: Internal Medicine

## 2015-07-07 ENCOUNTER — Ambulatory Visit: Payer: PPO | Admitting: Physical Therapy

## 2015-07-07 ENCOUNTER — Encounter: Payer: Self-pay | Admitting: Internal Medicine

## 2015-07-07 VITALS — BP 104/70 | HR 64 | Temp 96.6°F | Ht 60.0 in | Wt 159.4 lb

## 2015-07-07 DIAGNOSIS — B009 Herpesviral infection, unspecified: Secondary | ICD-10-CM | POA: Diagnosis not present

## 2015-07-07 DIAGNOSIS — N3000 Acute cystitis without hematuria: Secondary | ICD-10-CM | POA: Diagnosis not present

## 2015-07-07 DIAGNOSIS — R262 Difficulty in walking, not elsewhere classified: Secondary | ICD-10-CM

## 2015-07-07 DIAGNOSIS — R300 Dysuria: Secondary | ICD-10-CM | POA: Diagnosis not present

## 2015-07-07 DIAGNOSIS — Z23 Encounter for immunization: Secondary | ICD-10-CM | POA: Diagnosis not present

## 2015-07-07 DIAGNOSIS — R2681 Unsteadiness on feet: Secondary | ICD-10-CM

## 2015-07-07 DIAGNOSIS — R202 Paresthesia of skin: Secondary | ICD-10-CM

## 2015-07-07 DIAGNOSIS — R200 Anesthesia of skin: Secondary | ICD-10-CM

## 2015-07-07 LAB — POCT URINALYSIS DIPSTICK
BILIRUBIN UA: NEGATIVE
GLUCOSE UA: NEGATIVE
KETONES UA: NEGATIVE
Nitrite, UA: POSITIVE
Spec Grav, UA: 1.02
Urobilinogen, UA: 0.2
pH, UA: 5

## 2015-07-07 MED ORDER — CIPROFLOXACIN HCL 250 MG PO TABS: 250.0000 mg | ORAL_TABLET | Freq: Two times a day (BID) | ORAL | Status: DC

## 2015-07-07 MED ORDER — ACYCLOVIR 5 % EX CREA: 1.0000 "application " | TOPICAL_CREAM | CUTANEOUS | Status: DC

## 2015-07-07 NOTE — Progress Notes (Signed)
Date:  07/07/2015   Name:  Caroline Conway   DOB:  Oct 20, 1940   MRN:  [id number]   Chief Complaint: Urinary Tract Infection Urinary Tract Infection  This is a new problem. The current episode started in the past 7 days. The problem occurs every urination. The problem has been gradually worsening. The quality of the pain is described as burning. The pain is mild. There has been no fever. Associated symptoms include frequency (with cloudy urine), hesitancy, nausea and urgency. Pertinent negatives include no chills, hematuria, sweats or vomiting. She has tried nothing for the symptoms.  Rash This is a recurrent problem. The affected locations include the lips. The rash is characterized by blistering (recurrent herpes labialis). Pertinent negatives include no cough, shortness of breath or vomiting. Past treatments include antibiotic cream.     Review of Systems  Constitutional: Negative for chills.  HENT: Positive for mouth sores (lip lesion). Negative for facial swelling.   Respiratory: Negative for cough and shortness of breath.   Cardiovascular: Negative for chest pain.  Gastrointestinal: Positive for nausea. Negative for vomiting.  Genitourinary: Positive for hesitancy, urgency and frequency (with cloudy urine). Negative for hematuria, vaginal bleeding and pelvic pain.  Skin: Positive for rash.    Patient Active Problem List   Diagnosis Date Noted  . Anxiety disorder 01/29/2015  . Atrophic vaginitis 01/29/2015  . Dyslipidemia 01/29/2015  . Essential (primary) hypertension 01/29/2015  . Acid reflux 01/29/2015  . Blood in the urine 01/29/2015  . Depression, major, single episode, in partial remission ([place]) 01/29/2015  . Idiopathic peripheral neuropathy ([place]) 01/29/2015  . Idiopathic insomnia 01/29/2015  . Rheumatoid arthritis involving multiple joints ([place]) 01/29/2015  . Urge incontinence 01/29/2015  . Pelvic relaxation due to vaginal prolapse 01/29/2015    Prior to Admission  medications   Medication Sig Start Date End Date Taking? Authorizing Provider  atenolol (TENORMIN) 50 MG tablet TAKE 1 TABLET BY MOUTH DAILY 06/29/15  Yes Glean Hess, MD  conjugated estrogens (PREMARIN) vaginal cream Place 1 application vaginally 2 (two) times a week. 08/18/14  Yes Historical Provider, MD  DULoxetine (CYMBALTA) 20 MG capsule TAKE 1 CAPSULE (20 MG TOTAL) BY MOUTH ONCE DAILY. 05/31/15  Yes Historical Provider, MD  esomeprazole (NEXIUM) 20 MG capsule Take 1 capsule by mouth daily.   Yes Historical Provider, MD  folic acid (FOLVITE) 1 MG tablet Take 1 tablet by mouth daily.   Yes Historical Provider, MD  gabapentin (NEURONTIN) 100 MG capsule TAKE 1 TABLET IN AM AND 2 TABLETS AT NIGHT 04/27/15  Yes Historical Provider, MD  hydrochlorothiazide (HYDRODIURIL) 25 MG tablet Take 1 tablet by mouth daily. 07/15/14  Yes Historical Provider, MD  ibuprofen (ADVIL,MOTRIN) 600 MG tablet TAKE 1 TABLET (600 MG TOTAL) BY MOUTH EVERY 6 (SIX) HOURS AS NEEDED FOR PAIN. 04/05/15  Yes Historical Provider, MD  meloxicam (MOBIC) 15 MG tablet Take 1 tablet by mouth daily.   Yes Historical Provider, MD  methotrexate 2.5 MG tablet Take 8 tablets by mouth once a week.   Yes Historical Provider, MD  mirtazapine (REMERON) 30 MG tablet TAKE 1 TABLET BY MOUTH AT BEDTIME 05/03/15  Yes Glean Hess, MD  simvastatin (ZOCOR) 20 MG tablet Take 1 tablet by mouth at bedtime. 07/15/14  Yes Historical Provider, MD  zolpidem (AMBIEN) 5 MG tablet Take 1 tablet (5 mg total) by mouth at bedtime. 04/19/15  Yes Glean Hess, MD    Allergies  Allergen Reactions  . Codeine Sulfate Hives  .  Oxycodone Itching  . Propoxyphene Hives  . Penicillins Rash  . Sulfa Antibiotics Rash    Past Surgical History  Procedure Laterality Date  . Total knee arthroplasty Left 2011  . Riight shoulder surgery Right 1999  . Shoulder surgery Left 2006  . Lumbar disc surgery  2011  . Appendectomy  1960  . Vaginal hysterectomy  04/2015  .  Anterior and posterior vaginal repair  04/2015    Social History  Substance Use Topics  . Smoking status: Never Smoker   . Smokeless tobacco: None  . Alcohol Use: No     Medication list has been reviewed and updated.   Physical Exam  Constitutional: She appears well-developed and well-nourished.  HENT:  Mouth/Throat: Oral lesions present.    Cardiovascular: Normal rate, regular rhythm and normal heart sounds.   Pulmonary/Chest: Effort normal and breath sounds normal. She has no wheezes.  Abdominal: Soft. Normal appearance. There is no tenderness. There is no CVA tenderness.  Psychiatric: She has a normal mood and affect.    BP 104/70 mmHg  Pulse 64  Ht 5' (1.524 m)  Wt 159 lb 6.4 oz (72.303 kg)  BMI 31.13 kg/m2  Assessment and Plan: 1. Dysuria - POCT urinalysis dipstick  2. Acute cystitis without hematuria Increase fluid intake Return if needed - ciprofloxacin (CIPRO) 250 MG tablet; Take 1 tablet (250 mg total) by mouth 2 (two) times daily.  Dispense: 14 tablet; Refill: 0  3. Herpes simplex infection - acyclovir cream (ZOVIRAX) 5 %; Apply 1 application topically every 3 (three) hours.  Dispense: 15 g; Refill: 2  4. Flu vaccine need - Flu Vaccine QUAD 36+ mos PF IM (Fluarix & Fluzone Quad PF)   Halina Maidens, MD [facility]  07/07/2015

## 2015-07-07 NOTE — Therapy (Signed)
[place] MAIN [facility] SERVICES [address] [place], [place], [address] Phone: [phone]   Fax:  [phone]  Physical Therapy Treatment  Patient Details  Name: Caroline Conway MRN: [id number] Date of Birth: [birth year] Referring Provider: shah  Encounter Date: 07/07/2015      PT End of Session - 07/07/15 0929    Visit Number 3   Number of Visits 17   Date for PT Re-Evaluation 08-30-15   Authorization Type g codes 1   Equipment Utilized During Treatment Gait belt   Activity Tolerance Patient tolerated treatment well;Patient limited by fatigue   Behavior During Therapy [facility] for tasks assessed/performed      Past Medical History  Diagnosis Date  . Hypertension   . Neuropathy ([place])   . Rheumatoid arteritis   . H/O total hysterectomy     Past Surgical History  Procedure Laterality Date  . Total knee arthroplasty Left 2011  . Riight shoulder surgery Right 1999  . Shoulder surgery Left 2006  . Lumbar disc surgery  2011  . Appendectomy  1960  . Vaginal hysterectomy  04/2015  . Anterior and posterior vaginal repair  04/2015    There were no vitals filed for this visit.  Visit Diagnosis:  Difficulty walking  Numbness and tingling  Unsteady gait      Subjective Assessment - 07/07/15 0928    Subjective Patient has had a fall and is unsteady with her gait.    Currently in Pain? No/denies        standing hip abd , flex, extension with YTB x 20  side stepping left and right in parallel bars 10 feet x 3 standing on blue foam with cone reaching x 20 across midline step ups from floor to 6 inch stool x 20 bilateral sit to stand x 10 marching in parallel bars x 20 stepping pattern with weight shifting fwd/bwd x 10.  Patient continues to demonstrates less incoordination of movement with select exercises such as stepping backwards. Patient responds well to verbal and tactile cues to correct form and technique. Motor control of LE  much improved.  Muscle fatigue but no major pain complaints.                          PT Education - 07/07/15 0929    Education provided Yes   Education Details HEP   Person(s) Educated Patient   Methods Explanation   Comprehension Verbalized understanding             PT Long Term Goals - 06/30/15 1017    PT LONG TERM GOAL #1   Title Patient will be independent in home exercise program to improve strength/mobility for better functional independence with ADLs 2015/08/30   PT LONG TERM GOAL #2   Title Patient (> 40 years old) will complete five times sit to stand test in < 15 seconds indicating an increased LE strength and improved balance. 30-Aug-2015   PT LONG TERM GOAL #3   Title . Patient will increase 10 meter walk test to >1.43m/s as to improve gait speed for better community ambulation and to reduce fall risk 08/30/15   PT LONG TERM GOAL #4   Title Patient will be independent with ascend/descend 12 steps using single UE in step over step pattern without LOB. 08-30-2015               Plan - 07/07/15 0929    Clinical Impression Statement  PT provided min - moderate verbal instruction to improve set up, proper use of UE, and improved posture and gait mechanics. Patient responded moderately to instruction   Pt will benefit from skilled therapeutic intervention in order to improve on the following deficits Abnormal gait;Difficulty walking;Impaired sensation;Decreased strength;Decreased mobility;Decreased balance;Decreased activity tolerance   Rehab Potential Fair   PT Frequency 2x / week   PT Duration 8 weeks   PT Treatment/Interventions Stair training;Gait training;Neuromuscular re-education;Therapeutic exercise;Therapeutic activities   Consulted and Agree with Plan of Care Patient        Problem List Patient Active Problem List   Diagnosis Date Noted  . Anxiety disorder 01/29/2015  . Atrophic vaginitis 01/29/2015  . Dyslipidemia 01/29/2015  .  Essential (primary) hypertension 01/29/2015  . Acid reflux 01/29/2015  . Blood in the urine 01/29/2015  . Depression, major, single episode, in partial remission ([place]) 01/29/2015  . Idiopathic peripheral neuropathy ([place]) 01/29/2015  . Idiopathic insomnia 01/29/2015  . Rheumatoid arthritis involving multiple joints ([place]) 01/29/2015  . Urge incontinence 01/29/2015  . Pelvic relaxation due to vaginal prolapse 01/29/2015    [name] 07/07/2015, 9:34 AM  [place] MAIN [facility] SERVICES [address] [place], [place], [address] Phone: [phone]   Fax:  [phone]  Name: SHAMON LOBO MRN: [id number] Date of Birth: Nov 30, 1940

## 2015-07-11 ENCOUNTER — Encounter: Payer: Self-pay | Admitting: Physical Therapy

## 2015-07-11 ENCOUNTER — Ambulatory Visit: Payer: PPO | Admitting: Physical Therapy

## 2015-07-11 DIAGNOSIS — R262 Difficulty in walking, not elsewhere classified: Secondary | ICD-10-CM | POA: Diagnosis not present

## 2015-07-11 DIAGNOSIS — R200 Anesthesia of skin: Secondary | ICD-10-CM

## 2015-07-11 DIAGNOSIS — R202 Paresthesia of skin: Secondary | ICD-10-CM

## 2015-07-11 DIAGNOSIS — R2681 Unsteadiness on feet: Secondary | ICD-10-CM

## 2015-07-11 NOTE — Therapy (Signed)
[place] MAIN [facility] SERVICES [address] [place], [place], [address] Phone: [phone]   Fax:  [phone]  Physical Therapy Treatment  Patient Details  Name: Caroline Conway MRN: [id number] Date of Birth: [birth year] Referring Provider: shah  Encounter Date: 07/11/2015      PT End of Session - 07/11/15 1017    Visit Number 4   Number of Visits 17   Date for PT Re-Evaluation 09-Sep-2015   Authorization Type g codes 1   Equipment Utilized During Treatment Gait belt   Activity Tolerance Patient tolerated treatment well;Patient limited by fatigue   Behavior During Therapy [facility] for tasks assessed/performed      Past Medical History  Diagnosis Date  . Hypertension   . Neuropathy ([place])   . Rheumatoid arteritis   . H/O total hysterectomy     Past Surgical History  Procedure Laterality Date  . Total knee arthroplasty Left 2011  . Riight shoulder surgery Right 1999  . Shoulder surgery Left 2006  . Lumbar disc surgery  2011  . Appendectomy  1960  . Vaginal hysterectomy  04/2015  . Anterior and posterior vaginal repair  04/2015    There were no vitals filed for this visit.  Visit Diagnosis:  Difficulty walking  Numbness and tingling  Unsteady gait      Subjective Assessment - 07/11/15 1016    Subjective Patient has had a fall and is unsteady with her gait. She is having a bladder infection and the medicine is not working very well.    Currently in Pain? No/denies      standing on balance foam and tapping 6 inch stool Standing from stool and eccentric step downs x 10 BLE  standing hip abd with YTB x 20  side stepping left and right in parallel bars 10 feet x 3 standing on blue foam with cone reaching x 20 across midline step ups from floor to 6 inch stool x 20 bilateral sit to stand x 10 marching in parallel bars x 20 stepping pattern with weight shifting fwd/bwd x 10.  Min cueing needed to appropriately perform tasks  with leg, hand, and head position. Decreased coordination demonstrated requiring consistent verbal cueing to correct form. Patient continues to demonstrate some in coordination of movement with select exercises such as rock and reach and stepping backwards. Patient responds well to verbal and tactile cues to correct form and technique. CGA to SBA for safety with activities                           PT Education - 07/11/15 1017    Education provided Yes   Education Details HEP progression for balance traiing and strengthening   Person(s) Educated Patient   Methods Explanation   Comprehension Verbalized understanding             PT Long Term Goals - 06/30/15 1017    PT LONG TERM GOAL #1   Title Patient will be independent in home exercise program to improve strength/mobility for better functional independence with ADLs September 09, 2015   PT LONG TERM GOAL #2   Title Patient (> 52 years old) will complete five times sit to stand test in < 15 seconds indicating an increased LE strength and improved balance. September 09, 2015   PT LONG TERM GOAL #3   Title . Patient will increase 10 meter walk test to >1.51m/s as to improve gait speed for better community ambulation and to  reduce fall risk 08/25/15   PT LONG TERM GOAL #4   Title Patient will be independent with ascend/descend 12 steps using single UE in step over step pattern without LOB. 08/25/15               Plan - 07/11/15 1017    Clinical Impression Statement  Fatigue with sit to stand but demonstrating more control, Increase weight for standing exercises. Fatigue still evident with cross trainer and endurance   Pt will benefit from skilled therapeutic intervention in order to improve on the following deficits Abnormal gait;Difficulty walking;Impaired sensation;Decreased strength;Decreased mobility;Decreased balance;Decreased activity tolerance   Rehab Potential Fair   PT Frequency 2x / week   PT Duration 8 weeks   PT  Treatment/Interventions Stair training;Gait training;Neuromuscular re-education;Therapeutic exercise;Therapeutic activities   Consulted and Agree with Plan of Care Patient        Problem List Patient Active Problem List   Diagnosis Date Noted  . Herpes simplex infection 07/07/2015  . Anxiety disorder 01/29/2015  . Atrophic vaginitis 01/29/2015  . Dyslipidemia 01/29/2015  . Essential (primary) hypertension 01/29/2015  . Acid reflux 01/29/2015  . Depression, major, single episode, in partial remission ([place]) 01/29/2015  . Idiopathic peripheral neuropathy ([place]) 01/29/2015  . Idiopathic insomnia 01/29/2015  . Rheumatoid arthritis involving multiple joints ([place]) 01/29/2015  . Urge incontinence 01/29/2015  . Pelvic relaxation due to vaginal prolapse 01/29/2015    [name] 07/11/2015, 10:21 AM  [place] MAIN [facility] SERVICES [address] [place], [place], [address] Phone: [phone]   Fax:  [phone]  Name: DELAINEY WINSTANLEY MRN: [id number] Date of Birth: 1940-09-26

## 2015-07-13 ENCOUNTER — Ambulatory Visit: Payer: PPO | Attending: Neurology | Admitting: Physical Therapy

## 2015-07-13 ENCOUNTER — Encounter: Payer: Self-pay | Admitting: Physical Therapy

## 2015-07-13 DIAGNOSIS — R262 Difficulty in walking, not elsewhere classified: Secondary | ICD-10-CM | POA: Insufficient documentation

## 2015-07-13 DIAGNOSIS — R202 Paresthesia of skin: Secondary | ICD-10-CM | POA: Insufficient documentation

## 2015-07-13 DIAGNOSIS — R2681 Unsteadiness on feet: Secondary | ICD-10-CM | POA: Diagnosis present

## 2015-07-13 DIAGNOSIS — R200 Anesthesia of skin: Secondary | ICD-10-CM

## 2015-07-13 NOTE — Therapy (Signed)
[place] MAIN [facility] SERVICES [address] [place], [place], [address] Phone: [phone]   Fax:  [phone]  Physical Therapy Treatment  Patient Details  Name: Caroline Conway MRN: [id number] Date of Birth: 02/11/1941 Referring Provider: shah  Encounter Date: 07/13/2015      PT End of Session - 07/13/15 0931    Visit Number 5   Number of Visits 17   Date for PT Re-Evaluation 2015-09-02   Authorization Type g codes 1   Equipment Utilized During Treatment Gait belt   Activity Tolerance Patient tolerated treatment well;Patient limited by fatigue   Behavior During Therapy [facility] for tasks assessed/performed      Past Medical History  Diagnosis Date  . Hypertension   . Neuropathy ([place])   . Rheumatoid arteritis   . H/O total hysterectomy     Past Surgical History  Procedure Laterality Date  . Total knee arthroplasty Left 2011  . Riight shoulder surgery Right 1999  . Shoulder surgery Left 2006  . Lumbar disc surgery  2011  . Appendectomy  1960  . Vaginal hysterectomy  04/2015  . Anterior and posterior vaginal repair  04/2015    There were no vitals filed for this visit.  Visit Diagnosis:  Difficulty walking  Numbness and tingling  Unsteady gait      Subjective Assessment - 07/13/15 0930    Subjective Patient has had a fall and is unsteady with her gait. Her bladder infection seems worse and the medicine is not working very well.       standing hip abd with YTB x 20  side stepping left and right in parallel bars 10 feet x 3 standing on blue foam with cone reaching x 20 across midline step ups from floor to 6 inch stool x 20 bilateral sit to stand x 10 marching in parallel bars x 20 stepping pattern with weight shifting fwd/bwd x 10.  Standing on blue foam and ball toss, cone reaching, and head turns Side stepping on blue balance beam and fwd walking on blue foam balance beam Patient needs occasional verbal cueing to  improve posture and cueing to correctly perform exercises slowly, holding at end of range to increase motor firing of desired muscle to encourage fatigue.  Min cueing needed to appropriately perform tasks with leg, hand, and head position.  Patient continues to demonstrate some in coordination of movement with select exercises such as rock and reach and stepping backwards. Patient responds well to verbal and tactile cues to correct form and technique.  CGA to SBA for safety with activities.  Uses to increase intensity and amplitude of movements throughout session                           PT Education - 07/13/15 0931    Education provided Yes   Education Details HEP   Person(s) Educated Patient   Methods Explanation   Comprehension Verbalized understanding             PT Long Term Goals - 06/30/15 1017    PT LONG TERM GOAL #1   Title Patient will be independent in home exercise program to improve strength/mobility for better functional independence with ADLs 02-Sep-2015   PT LONG TERM GOAL #2   Title Patient (74 years old) will complete five times sit to stand test in < 15 seconds indicating an increased LE strength and improved balance. 09-02-2015   PT LONG  TERM GOAL #3   Title . Patient will increase 10 meter walk test to >1.22m/s as to improve gait speed for better community ambulation and to reduce fall risk 08/25/15   PT LONG TERM GOAL #4   Title Patient will be independent with ascend/descend 12 steps using single UE in step over step pattern without LOB. 08/25/15               Plan - 07/13/15 0932    Clinical Impression Statement PT provided min - moderate verbal instruction to improve set up, proper use of UE, and improved posture and gait mechanics. Patient responded moderately to instruction   Pt will benefit from skilled therapeutic intervention in order to improve on the following deficits Abnormal gait;Difficulty walking;Impaired  sensation;Decreased strength;Decreased mobility;Decreased balance;Decreased activity tolerance   Rehab Potential Fair   PT Frequency 2x / week   PT Duration 8 weeks   PT Treatment/Interventions Stair training;Gait training;Neuromuscular re-education;Therapeutic exercise;Therapeutic activities   Consulted and Agree with Plan of Care Patient        Problem List Patient Active Problem List   Diagnosis Date Noted  . Herpes simplex infection 07/07/2015  . Anxiety disorder 01/29/2015  . Atrophic vaginitis 01/29/2015  . Dyslipidemia 01/29/2015  . Essential (primary) hypertension 01/29/2015  . Acid reflux 01/29/2015  . Depression, major, single episode, in partial remission ([place]) 01/29/2015  . Idiopathic peripheral neuropathy ([place]) 01/29/2015  . Idiopathic insomnia 01/29/2015  . Rheumatoid arthritis involving multiple joints ([place]) 01/29/2015  . Urge incontinence 01/29/2015  . Pelvic relaxation due to vaginal prolapse 01/29/2015    [name] 07/13/2015, 10:01 AM  [place] MAIN [facility] SERVICES [address] [place], [place], [address] Phone: [phone]   Fax:  [phone]  Name: Caroline Conway MRN: [id number] Date of Birth: 1941-01-04

## 2015-07-18 ENCOUNTER — Encounter: Payer: Self-pay | Admitting: Physical Therapy

## 2015-07-18 ENCOUNTER — Ambulatory Visit: Payer: PPO | Admitting: Physical Therapy

## 2015-07-18 DIAGNOSIS — R2681 Unsteadiness on feet: Secondary | ICD-10-CM

## 2015-07-18 DIAGNOSIS — R262 Difficulty in walking, not elsewhere classified: Secondary | ICD-10-CM

## 2015-07-18 DIAGNOSIS — R202 Paresthesia of skin: Secondary | ICD-10-CM

## 2015-07-18 DIAGNOSIS — R200 Anesthesia of skin: Secondary | ICD-10-CM

## 2015-07-18 NOTE — Therapy (Signed)
[place] MAIN [facility] SERVICES [address] [place], [place], [address] Phone: [phone]   Fax:  [phone]  Physical Therapy Treatment  Patient Details  Name: Caroline Conway MRN: [id number] Date of Birth: [birth year] Referring Provider: shah  Encounter Date: 07/18/2015      PT End of Session - 07/18/15 0946    Visit Number 6   Number of Visits 6   Date for PT Re-Evaluation 08/25/15   Authorization Type 6   Equipment Utilized During Treatment Gait belt   Activity Tolerance Patient tolerated treatment well;No increased pain;Patient limited by fatigue      Past Medical History  Diagnosis Date  . Hypertension   . Neuropathy ([place])   . Rheumatoid arteritis   . H/O total hysterectomy     Past Surgical History  Procedure Laterality Date  . Total knee arthroplasty Left 2011  . Riight shoulder surgery Right 1999  . Shoulder surgery Left 2006  . Lumbar disc surgery  2011  . Appendectomy  1960  . Vaginal hysterectomy  04/2015  . Anterior and posterior vaginal repair  04/2015    There were no vitals filed for this visit.  Visit Diagnosis:  Difficulty walking  Numbness and tingling  Unsteady gait      Subjective Assessment - 07/18/15 0945    Subjective Patient has had a fall and is unsteady with her gait.      standing hip abd with YTB x 20  side stepping left and right in parallel bars 10 feet x 3 standing on blue foam with cone reaching x 20 across midline step ups from floor to 6 inch stool x 20 bilateral 1/2 foam cone reaching marching in parallel bars x 20 stepping pattern with weight shifting fwd/bwd x 10.  Patient continues to demonstrates less incoordination of movement with select exercises. Patient responds well to verbal and tactile cues to correct form and technique. Motor control of LE much improved.  Muscle fatigue but no major pain complaints.                           PT Education -  07/18/15 0946    Education provided Yes   Education Details HEP   Person(s) Educated Patient   Methods Explanation   Comprehension Verbalized understanding             PT Long Term Goals - 06/30/15 1017    PT LONG TERM GOAL #1   Title Patient will be independent in home exercise program to improve strength/mobility for better functional independence with ADLs 08/25/15   PT LONG TERM GOAL #2   Title Patient (> 3 years old) will complete five times sit to stand test in < 15 seconds indicating an increased LE strength and improved balance. 08/25/15   PT LONG TERM GOAL #3   Title . Patient will increase 10 meter walk test to >1.57m/s as to improve gait speed for better community ambulation and to reduce fall risk 08/25/15   PT LONG TERM GOAL #4   Title Patient will be independent with ascend/descend 12 steps using single UE in step over step pattern without LOB. 08/25/15               Plan - 07/18/15 1008    Clinical Impression Statement  Fatigue with sit to stand but demonstrating more control, Increase weight for standing exercises. Fatigue still evident with exercises  and endurance  Pt will benefit from skilled therapeutic intervention in order to improve on the following deficits Abnormal gait;Difficulty walking;Impaired sensation;Decreased strength;Decreased mobility;Decreased balance;Decreased activity tolerance   Rehab Potential Fair   PT Frequency 2x / week   PT Duration 8 weeks   PT Treatment/Interventions Stair training;Gait training;Neuromuscular re-education;Therapeutic exercise;Therapeutic activities   Consulted and Agree with Plan of Care Patient        Problem List Patient Active Problem List   Diagnosis Date Noted  . Herpes simplex infection 07/07/2015  . Anxiety disorder 01/29/2015  . Atrophic vaginitis 01/29/2015  . Dyslipidemia 01/29/2015  . Essential (primary) hypertension 01/29/2015  . Acid reflux 01/29/2015  . Depression, major, single  episode, in partial remission ([place]) 01/29/2015  . Idiopathic peripheral neuropathy ([place]) 01/29/2015  . Idiopathic insomnia 01/29/2015  . Rheumatoid arthritis involving multiple joints ([place]) 01/29/2015  . Urge incontinence 01/29/2015  . Pelvic relaxation due to vaginal prolapse 01/29/2015    [name] 07/18/2015, 10:10 AM  [place] MAIN [facility] SERVICES [address] [place], [place], [address] Phone: [phone]   Fax:  [phone]  Name: Caroline Conway MRN: [id number] Date of Birth: Dec 09, 1940

## 2015-07-20 ENCOUNTER — Ambulatory Visit: Payer: PPO | Admitting: Physical Therapy

## 2015-07-20 ENCOUNTER — Encounter: Payer: Self-pay | Admitting: Physical Therapy

## 2015-07-20 DIAGNOSIS — R2681 Unsteadiness on feet: Secondary | ICD-10-CM

## 2015-07-20 DIAGNOSIS — R262 Difficulty in walking, not elsewhere classified: Secondary | ICD-10-CM

## 2015-07-20 DIAGNOSIS — R200 Anesthesia of skin: Secondary | ICD-10-CM

## 2015-07-20 DIAGNOSIS — R202 Paresthesia of skin: Secondary | ICD-10-CM

## 2015-07-20 NOTE — Therapy (Signed)
[place] MAIN [facility] SERVICES [address] [place], [place], [address] Phone: [phone]   Fax:  [phone]  Physical Therapy Treatment  Patient Details  Name: Caroline Conway MRN: [id number] Date of Birth: 11-Aug-1941 Referring Provider: shah  Encounter Date: 07/20/2015      PT End of Session - 07/20/15 1327    Visit Number 7   Number of Visits 6   Date for PT Re-Evaluation 08/25/15   Authorization Type 6   Equipment Utilized During Treatment Gait belt   Activity Tolerance Patient tolerated treatment well;No increased pain;Patient limited by fatigue      Past Medical History  Diagnosis Date  . Hypertension   . Neuropathy ([place])   . Rheumatoid arteritis   . H/O total hysterectomy     Past Surgical History  Procedure Laterality Date  . Total knee arthroplasty Left 2011  . Riight shoulder surgery Right 1999  . Shoulder surgery Left 2006  . Lumbar disc surgery  2011  . Appendectomy  1960  . Vaginal hysterectomy  04/2015  . Anterior and posterior vaginal repair  04/2015    There were no vitals filed for this visit.  Visit Diagnosis:  Difficulty walking  Numbness and tingling  Unsteady gait      Subjective Assessment - 07/20/15 1326    Subjective Patient has had a fall and is unsteady with her gait. She is limping today.       standing hip abd with YTB x 20  side stepping left and right in parallel bars 10 feet x 3 standing on blue foam with cone reaching x 20 across midline step ups from floor to 6 inch stool x 20 bilateral Step ups to stool from blue foam marching in parallel bars x 20  Blue foam to stool to blue foam fwd and sidestepping Patient is tired today and has a more pronounced limp with spc.                            PT Education - 07/20/15 1327    Education provided Yes   Education Details HEP   Person(s) Educated Patient   Methods Explanation   Comprehension Verbalized  understanding             PT Long Term Goals - 06/30/15 1017    PT LONG TERM GOAL #1   Title Patient will be independent in home exercise program to improve strength/mobility for better functional independence with ADLs 08/25/15   PT LONG TERM GOAL #2   Title Patient (> 47 years old) will complete five times sit to stand test in < 15 seconds indicating an increased LE strength and improved balance. 08/25/15   PT LONG TERM GOAL #3   Title . Patient will increase 10 meter walk test to >1.80m/s as to improve gait speed for better community ambulation and to reduce fall risk 08/25/15   PT LONG TERM GOAL #4   Title Patient will be independent with ascend/descend 12 steps using single UE in step over step pattern without LOB. 08/25/15               Plan - 07/20/15 1327    Clinical Impression Statement PT provided min - moderate verbal instruction to improve set up, proper use of UE, and improved posture and gait mechanics. Patient responded moderately to instruction   Pt will benefit from skilled therapeutic intervention in order to improve on  the following deficits Abnormal gait;Difficulty walking;Impaired sensation;Decreased strength;Decreased mobility;Decreased balance;Decreased activity tolerance   Rehab Potential Fair   PT Frequency 2x / week   PT Duration 8 weeks   PT Treatment/Interventions Stair training;Gait training;Neuromuscular re-education;Therapeutic exercise;Therapeutic activities   Consulted and Agree with Plan of Care Patient        Problem List Patient Active Problem List   Diagnosis Date Noted  . Herpes simplex infection 07/07/2015  . Anxiety disorder 01/29/2015  . Atrophic vaginitis 01/29/2015  . Dyslipidemia 01/29/2015  . Essential (primary) hypertension 01/29/2015  . Acid reflux 01/29/2015  . Depression, major, single episode, in partial remission ([place]) 01/29/2015  . Idiopathic peripheral neuropathy ([place]) 01/29/2015  . Idiopathic insomnia 01/29/2015   . Rheumatoid arthritis involving multiple joints ([place]) 01/29/2015  . Urge incontinence 01/29/2015  . Pelvic relaxation due to vaginal prolapse 01/29/2015    [name] 07/20/2015, 1:28 PM  [place] MAIN [facility] SERVICES [address] [place], [place], [address] Phone: [phone]   Fax:  [phone]  Name: Caroline Conway MRN: [id number] Date of Birth: [birth year]

## 2015-07-26 ENCOUNTER — Ambulatory Visit: Payer: PPO | Admitting: Physical Therapy

## 2015-07-28 ENCOUNTER — Ambulatory Visit: Payer: PPO | Admitting: Physical Therapy

## 2015-07-31 ENCOUNTER — Other Ambulatory Visit: Payer: Self-pay | Admitting: Internal Medicine

## 2015-08-01 ENCOUNTER — Ambulatory Visit (INDEPENDENT_AMBULATORY_CARE_PROVIDER_SITE_OTHER): Payer: PPO | Admitting: Internal Medicine

## 2015-08-01 ENCOUNTER — Encounter: Payer: Self-pay | Admitting: Internal Medicine

## 2015-08-01 VITALS — BP 100/60 | HR 62 | Temp 98.5°F | Ht 60.0 in | Wt 156.0 lb

## 2015-08-01 DIAGNOSIS — R599 Enlarged lymph nodes, unspecified: Secondary | ICD-10-CM | POA: Diagnosis not present

## 2015-08-01 DIAGNOSIS — R591 Generalized enlarged lymph nodes: Secondary | ICD-10-CM

## 2015-08-01 MED ORDER — CLINDAMYCIN HCL 300 MG PO CAPS: 300.0000 mg | ORAL_CAPSULE | Freq: Three times a day (TID) | ORAL | Status: DC

## 2015-08-01 NOTE — Progress Notes (Signed)
Date:  08/01/2015   Name:  Caroline Conway   DOB:  1941-02-24   MRN:  [id number]   Chief Complaint: Lymphadenopathy Patient noted onset of swelling on the right side of her neck at the angle of jaw two days ago.  It is tender and gradually enlarging.  She does not recall any injury.  She denies any dental issues - she has dentures.  She has no sinus symptoms or ear pain. She tried to see ENT but he is booked today.   Review of Systems  Constitutional: Negative for fever, chills and fatigue.  HENT: Positive for facial swelling. Negative for dental problem, ear pain, hearing loss, sinus pressure, sore throat and tinnitus.   Respiratory: Negative for cough, chest tightness and shortness of breath.   Cardiovascular: Negative for chest pain and palpitations.    Patient Active Problem List   Diagnosis Date Noted  . Herpes simplex infection 07/07/2015  . Anxiety disorder 01/29/2015  . Atrophic vaginitis 01/29/2015  . Dyslipidemia 01/29/2015  . Essential (primary) hypertension 01/29/2015  . Acid reflux 01/29/2015  . Depression, major, single episode, in partial remission ([place]) 01/29/2015  . Idiopathic peripheral neuropathy ([place]) 01/29/2015  . Idiopathic insomnia 01/29/2015  . Rheumatoid arthritis involving multiple joints ([place]) 01/29/2015  . Urge incontinence 01/29/2015  . Pelvic relaxation due to vaginal prolapse 01/29/2015    Prior to Admission medications   Medication Sig Start Date End Date Taking? Authorizing Provider  acyclovir cream (ZOVIRAX) 5 % Apply 1 application topically every 3 (three) hours. 07/07/15   Glean Hess, MD  atenolol (TENORMIN) 50 MG tablet TAKE 1 TABLET BY MOUTH DAILY 06/29/15   Glean Hess, MD  ciprofloxacin (CIPRO) 250 MG tablet Take 1 tablet (250 mg total) by mouth 2 (two) times daily. 07/07/15   Glean Hess, MD  citalopram (CELEXA) 10 MG tablet TAKE 1 TABLET BY MOUTH DAILY 07/31/15   Glean Hess, MD  conjugated estrogens (PREMARIN)  vaginal cream Place 1 application vaginally 2 (two) times a week. 08/18/14   Historical Provider, MD  DULoxetine (CYMBALTA) 20 MG capsule TAKE 1 CAPSULE (20 MG TOTAL) BY MOUTH ONCE DAILY. 05/31/15   Historical Provider, MD  esomeprazole (NEXIUM) 20 MG capsule Take 1 capsule by mouth daily.    Historical Provider, MD  folic acid (FOLVITE) 1 MG tablet Take 1 tablet by mouth daily.    Historical Provider, MD  gabapentin (NEURONTIN) 100 MG capsule TAKE 1 TABLET IN AM AND 2 TABLETS AT NIGHT 04/27/15   Historical Provider, MD  hydrochlorothiazide (HYDRODIURIL) 25 MG tablet TAKE 2 TABLETS BY MOUTH DAILY 07/31/15   Glean Hess, MD  ibuprofen (ADVIL,MOTRIN) 600 MG tablet TAKE 1 TABLET (600 MG TOTAL) BY MOUTH EVERY 6 (SIX) HOURS AS NEEDED FOR PAIN. 04/05/15   Historical Provider, MD  meloxicam (MOBIC) 15 MG tablet Take 1 tablet by mouth daily.    Historical Provider, MD  methotrexate 2.5 MG tablet Take 8 tablets by mouth once a week.    Historical Provider, MD  mirtazapine (REMERON) 30 MG tablet TAKE 1 TABLET BY MOUTH AT BEDTIME 05/03/15   Glean Hess, MD  simvastatin (ZOCOR) 20 MG tablet Take 1 tablet by mouth at bedtime. 07/15/14   Historical Provider, MD  zolpidem (AMBIEN) 5 MG tablet Take 1 tablet (5 mg total) by mouth at bedtime. 04/19/15   Glean Hess, MD    Allergies  Allergen Reactions  . Codeine Sulfate Hives  . Oxycodone Itching  .  Propoxyphene Hives  . Penicillins Rash  . Sulfa Antibiotics Rash    Past Surgical History  Procedure Laterality Date  . Total knee arthroplasty Left 2011  . Riight shoulder surgery Right 1999  . Shoulder surgery Left 2006  . Lumbar disc surgery  2011  . Appendectomy  1960  . Vaginal hysterectomy  04/2015  . Anterior and posterior vaginal repair  04/2015    Social History  Substance Use Topics  . Smoking status: Never Smoker   . Smokeless tobacco: None  . Alcohol Use: No    Medication list has been reviewed and updated.   Physical Exam    Constitutional: She is oriented to person, place, and time. She appears well-developed and well-nourished. No distress.  HENT:  Head: Normocephalic and atraumatic.  Right Ear: Tympanic membrane and ear canal normal.  Left Ear: Tympanic membrane and ear canal normal.  Nose: Right sinus exhibits no maxillary sinus tenderness and no frontal sinus tenderness. Left sinus exhibits no maxillary sinus tenderness and no frontal sinus tenderness.  Mouth/Throat: No posterior oropharyngeal erythema.  Eyes: Right eye exhibits no discharge. Left eye exhibits no discharge. No scleral icterus.  Neck:    Cardiovascular: Normal rate, regular rhythm and normal heart sounds.   Pulmonary/Chest: Effort normal and breath sounds normal. No respiratory distress.  Musculoskeletal: Normal range of motion.  Neurological: She is alert and oriented to person, place, and time.  Skin: Skin is warm and dry. No rash noted.  Psychiatric: She has a normal mood and affect. Her behavior is normal. Thought content normal.    BP 100/60 mmHg  Pulse 62  Temp(Src) 98.5 F (36.9 C)  Ht 5' (1.524 m)  Wt 156 lb (70.761 kg)  BMI 30.47 kg/m2  SpO2 98%  Assessment and Plan: 1. Lymphadenopathy of head and neck Uncertain etiology Schedule follow-up with ENT in one week Call or return here sooner if symptoms worsen - clindamycin (CLEOCIN) 300 MG capsule; Take 1 capsule (300 mg total) by mouth 3 (three) times daily.  Dispense: 30 capsule; Refill: 0   Halina Maidens, MD [facility]  08/01/2015

## 2015-08-02 ENCOUNTER — Ambulatory Visit: Payer: PPO | Admitting: Physical Therapy

## 2015-08-02 ENCOUNTER — Encounter: Payer: Self-pay | Admitting: Physical Therapy

## 2015-08-02 DIAGNOSIS — R200 Anesthesia of skin: Secondary | ICD-10-CM

## 2015-08-02 DIAGNOSIS — R262 Difficulty in walking, not elsewhere classified: Secondary | ICD-10-CM | POA: Diagnosis not present

## 2015-08-02 DIAGNOSIS — R202 Paresthesia of skin: Secondary | ICD-10-CM

## 2015-08-02 DIAGNOSIS — R2681 Unsteadiness on feet: Secondary | ICD-10-CM

## 2015-08-02 NOTE — Therapy (Signed)
[place] MAIN [facility] SERVICES [address] [place], [place], [address] Phone: [phone]   Fax:  [phone]  Physical Therapy Treatment  Patient Details  Name: Caroline Conway MRN: [id number] Date of Birth: [birth year] Referring Provider: shah  Encounter Date: 08/02/2015      PT End of Session - 08/02/15 1421    Visit Number 8   Number of Visits 6   Date for PT Re-Evaluation 08/25/15   Authorization Type 8   Equipment Utilized During Treatment Gait belt   Activity Tolerance Patient tolerated treatment well;No increased pain;Patient limited by fatigue      Past Medical History  Diagnosis Date  . Hypertension   . Neuropathy ([place])   . Rheumatoid arteritis   . H/O total hysterectomy     Past Surgical History  Procedure Laterality Date  . Total knee arthroplasty Left 2011  . Riight shoulder surgery Right 1999  . Shoulder surgery Left 2006  . Lumbar disc surgery  2011  . Appendectomy  1960  . Vaginal hysterectomy  04/2015  . Anterior and posterior vaginal repair  04/2015    There were no vitals filed for this visit.  Visit Diagnosis:  Difficulty walking  Numbness and tingling  Unsteady gait      Subjective Assessment - 08/02/15 1420    Subjective Patient has a enlarged lympth node and abscess on her right neck. She went to the MD and cant get to her ENT until next tuesday. She is on antiobiotics.    Currently in Pain? No/denies        standing hip abd with YTB x 20  side stepping left and right in parallel bars 10 feet x 3 standing on blue foam with cone reaching x 20 across midline step ups from floor to 6 inch stool x 20 bilateral sit to stand x 10 marching in parallel bars x 20 stepping pattern with weight shifting fwd/bwd x 10.   Muscle fatigue but no major pain complaints. Patient advancing to red theraband for exercises listed above                          PT Education - 08/02/15 1421     Education provided Yes   Education Details HEP   Person(s) Educated Patient   Methods Explanation   Comprehension Verbalized understanding             PT Long Term Goals - 06/30/15 1017    PT LONG TERM GOAL #1   Title Patient will be independent in home exercise program to improve strength/mobility for better functional independence with ADLs 08/25/15   PT LONG TERM GOAL #2   Title Patient (> 87 years old) will complete five times sit to stand test in < 15 seconds indicating an increased LE strength and improved balance. 08/25/15   PT LONG TERM GOAL #3   Title . Patient will increase 10 meter walk test to >1.13m/s as to improve gait speed for better community ambulation and to reduce fall risk 08/25/15   PT LONG TERM GOAL #4   Title Patient will be independent with ascend/descend 12 steps using single UE in step over step pattern without LOB. 08/25/15               Plan - 08/02/15 1422    Clinical Impression Statement Fatigue with sit to stand but demonstrating more control, Increase weight for standing exercises. Fatigue evident with cross  trainer and endurance   Pt will benefit from skilled therapeutic intervention in order to improve on the following deficits Abnormal gait;Difficulty walking;Impaired sensation;Decreased strength;Decreased mobility;Decreased balance;Decreased activity tolerance   Rehab Potential Fair   PT Frequency 2x / week   PT Duration 8 weeks   PT Treatment/Interventions Stair training;Gait training;Neuromuscular re-education;Therapeutic exercise;Therapeutic activities   Consulted and Agree with Plan of Care Patient        Problem List Patient Active Problem List   Diagnosis Date Noted  . Herpes simplex infection 07/07/2015  . Anxiety disorder 01/29/2015  . Atrophic vaginitis 01/29/2015  . Dyslipidemia 01/29/2015  . Essential (primary) hypertension 01/29/2015  . Acid reflux 01/29/2015  . Depression, major, single episode, in partial  remission ([place]) 01/29/2015  . Idiopathic peripheral neuropathy ([place]) 01/29/2015  . Idiopathic insomnia 01/29/2015  . Rheumatoid arthritis involving multiple joints ([place]) 01/29/2015  . Urge incontinence 01/29/2015  . Pelvic relaxation due to vaginal prolapse 01/29/2015    [name] 08/02/2015, 2:23 PM  [place] MAIN [facility] SERVICES [address] [place], [place], [address] Phone: [phone]   Fax:  [phone]  Name: Caroline Conway MRN: [id number] Date of Birth: 11/02/1940

## 2015-08-08 ENCOUNTER — Ambulatory Visit: Payer: PPO | Admitting: Physical Therapy

## 2015-08-08 ENCOUNTER — Encounter: Payer: Self-pay | Admitting: Physical Therapy

## 2015-08-08 DIAGNOSIS — R202 Paresthesia of skin: Secondary | ICD-10-CM

## 2015-08-08 DIAGNOSIS — R262 Difficulty in walking, not elsewhere classified: Secondary | ICD-10-CM

## 2015-08-08 DIAGNOSIS — R200 Anesthesia of skin: Secondary | ICD-10-CM

## 2015-08-08 DIAGNOSIS — R2681 Unsteadiness on feet: Secondary | ICD-10-CM

## 2015-08-08 NOTE — Therapy (Signed)
[place] MAIN [facility] SERVICES [address] [place], [place], [address] Phone: [phone]   Fax:  [phone]  Physical Therapy Treatment  Patient Details  Name: Caroline Conway MRN: [id number] Date of Birth: 10/25/1940 Referring Provider: shah  Encounter Date: 08/08/2015      PT End of Session - 08/08/15 1414    Visit Number 9   Number of Visits 6   Date for PT Re-Evaluation 08/25/15   Authorization Type 8   PT Start Time 0145   PT Stop Time 0230   PT Time Calculation (min) 45 min   Equipment Utilized During Treatment Gait belt   Activity Tolerance Patient tolerated treatment well;No increased pain;Patient limited by fatigue      Past Medical History  Diagnosis Date  . Hypertension   . Neuropathy ([place])   . Rheumatoid arteritis   . H/O total hysterectomy     Past Surgical History  Procedure Laterality Date  . Total knee arthroplasty Left 2011  . Riight shoulder surgery Right 1999  . Shoulder surgery Left 2006  . Lumbar disc surgery  2011  . Appendectomy  1960  . Vaginal hysterectomy  04/2015  . Anterior and posterior vaginal repair  04/2015    There were no vitals filed for this visit.  Visit Diagnosis:  Difficulty walking  Numbness and tingling  Unsteady gait      Subjective Assessment - 08/08/15 1413    Subjective Patient is having a neck inflamation and sees an ENT tomorrow,      Therapeutic exercise; Leg press x 20 x 3 130 lbs, heel raises with 90 lbs x 20 x 3 Heel raises standing  4 way hip RTB x 20 Step ups to 6 inch stool x 20 PT provided min - moderate verbal instruction to improve set up, proper use of UE, and improved posture and gait mechanics. Patient responded moderately to instruction                           PT Education - 08/08/15 1414    Education provided Yes   Education Details HEP   Methods Explanation   Comprehension Verbalized understanding              PT Long Term Goals - 06/30/15 1017    PT LONG TERM GOAL #1   Title Patient will be independent in home exercise program to improve strength/mobility for better functional independence with ADLs 08/25/15   PT LONG TERM GOAL #2   Title Patient (> 46 years old) will complete five times sit to stand test in < 15 seconds indicating an increased LE strength and improved balance. 08/25/15   PT LONG TERM GOAL #3   Title . Patient will increase 10 meter walk test to >1.36m/s as to improve gait speed for better community ambulation and to reduce fall risk 08/25/15   PT LONG TERM GOAL #4   Title Patient will be independent with ascend/descend 12 steps using single UE in step over step pattern without LOB. 08/25/15               Plan - 08/08/15 1415    Clinical Impression Statement CGA to SBA for safety with activities   Pt will benefit from skilled therapeutic intervention in order to improve on the following deficits Abnormal gait;Difficulty walking;Impaired sensation;Decreased strength;Decreased mobility;Decreased balance;Decreased activity tolerance   Rehab Potential Fair   PT Frequency 2x / week  PT Duration 8 weeks   PT Treatment/Interventions Stair training;Gait training;Neuromuscular re-education;Therapeutic exercise;Therapeutic activities   Consulted and Agree with Plan of Care Patient        Problem List Patient Active Problem List   Diagnosis Date Noted  . Herpes simplex infection 07/07/2015  . Anxiety disorder 01/29/2015  . Atrophic vaginitis 01/29/2015  . Dyslipidemia 01/29/2015  . Essential (primary) hypertension 01/29/2015  . Acid reflux 01/29/2015  . Depression, major, single episode, in partial remission ([place]) 01/29/2015  . Idiopathic peripheral neuropathy ([place]) 01/29/2015  . Idiopathic insomnia 01/29/2015  . Rheumatoid arthritis involving multiple joints ([place]) 01/29/2015  . Urge incontinence 01/29/2015  . Pelvic relaxation due to vaginal prolapse 01/29/2015     [name] 08/08/2015, 2:17 PM  [place] MAIN [facility] SERVICES [address] [place], [place], [address] Phone: [phone]   Fax:  [phone]  Name: Caroline Conway MRN: [id number] Date of Birth: December 03, 1940

## 2015-08-09 ENCOUNTER — Other Ambulatory Visit: Payer: Self-pay | Admitting: Otolaryngology

## 2015-08-09 DIAGNOSIS — R221 Localized swelling, mass and lump, neck: Secondary | ICD-10-CM

## 2015-08-10 ENCOUNTER — Ambulatory Visit: Payer: PPO | Admitting: Physical Therapy

## 2015-08-12 ENCOUNTER — Ambulatory Visit
Admission: RE | Admit: 2015-08-12 | Discharge: 2015-08-12 | Disposition: A | Discharge status: Home / Self Care | Payer: PPO | Source: Ambulatory Visit | Attending: Otolaryngology | Admitting: Otolaryngology

## 2015-08-12 ENCOUNTER — Other Ambulatory Visit
Admission: AD | Admit: 2015-08-12 | Discharge: 2015-08-12 | Disposition: A | Discharge status: Home / Self Care | Payer: PPO | Source: Ambulatory Visit | Attending: Otolaryngology | Admitting: Otolaryngology

## 2015-08-12 DIAGNOSIS — R221 Localized swelling, mass and lump, neck: Secondary | ICD-10-CM | POA: Diagnosis not present

## 2015-08-12 DIAGNOSIS — M47892 Other spondylosis, cervical region: Secondary | ICD-10-CM | POA: Insufficient documentation

## 2015-08-12 LAB — CREATININE, SERUM: CREATININE: 0.89 mg/dL (ref 0.44–1.00)

## 2015-08-12 MED ORDER — IOHEXOL 350 MG/ML SOLN
75.0000 mL | Freq: Once | INTRAVENOUS | Status: AC | PRN
  Administered 2015-08-12: 75 mL via INTRAVENOUS

## 2015-08-15 ENCOUNTER — Other Ambulatory Visit: Payer: Self-pay | Admitting: Internal Medicine

## 2015-08-15 ENCOUNTER — Ambulatory Visit: Payer: PPO | Admitting: Physical Therapy

## 2015-08-17 ENCOUNTER — Encounter: Payer: Self-pay | Admitting: Physical Therapy

## 2015-08-17 ENCOUNTER — Ambulatory Visit: Payer: PPO | Attending: Neurology | Admitting: Physical Therapy

## 2015-08-17 DIAGNOSIS — R202 Paresthesia of skin: Secondary | ICD-10-CM | POA: Insufficient documentation

## 2015-08-17 DIAGNOSIS — R262 Difficulty in walking, not elsewhere classified: Secondary | ICD-10-CM | POA: Insufficient documentation

## 2015-08-17 DIAGNOSIS — R200 Anesthesia of skin: Secondary | ICD-10-CM

## 2015-08-17 DIAGNOSIS — R2681 Unsteadiness on feet: Secondary | ICD-10-CM | POA: Diagnosis present

## 2015-08-17 NOTE — Therapy (Signed)
[place] MAIN [facility] SERVICES [address] [place], [place], [address] Phone: [phone]   Fax:  [phone]  Physical Therapy Treatment  Patient Details  Name: Caroline Conway MRN: [id number] Date of Birth: 1941-08-26 Referring Provider: Manuella Ghazi  Encounter Date: 08/17/2015      PT End of Session - 08/17/15 1402    Visit Number 10   Date for PT Re-Evaluation 08/25/15   PT Start Time 0150   PT Stop Time 0230   PT Time Calculation (min) 40 min      Past Medical History  Diagnosis Date  . Hypertension   . Neuropathy ([place])   . Rheumatoid arteritis   . H/O total hysterectomy     Past Surgical History  Procedure Laterality Date  . Total knee arthroplasty Left 2011  . Riight shoulder surgery Right 1999  . Shoulder surgery Left 2006  . Lumbar disc surgery  2011  . Appendectomy  1960  . Vaginal hysterectomy  04/2015  . Anterior and posterior vaginal repair  04/2015    There were no vitals filed for this visit.  Visit Diagnosis:  Difficulty walking  Numbness and tingling  Unsteady gait      Subjective Assessment - 08/17/15 1401    Subjective Patient had a blocked saliva gland.    Currently in Pain? No/denies      standing hip abd with YTB x 20  side stepping left and right in parallel bars on blue foam 10 feet x 3 standing on blue foam with cone reaching x 20 across midline step ups from floor to 6 inch stool x 20 bilateral Heel raises x 20 x 2  Leg press x 20 90 lbs x 2 sets    Min cueing needed to appropriately perform balance tasks head position. Patient responds well to verbal and tactile cues to correct form and technique.  CGA to SBA for safety with activities.  Uses to increase intensity of movements throughout session                         PT Education - 08/17/15 1402    Education provided Yes   Education Details HEP   Person(s) Educated Patient   Methods Explanation   Comprehension  Verbalized understanding             PT Long Term Goals - 06/30/15 1017    PT LONG TERM GOAL #1   Title Patient will be independent in home exercise program to improve strength/mobility for better functional independence with ADLs 08/25/15   PT LONG TERM GOAL #2   Title Patient (> 43 years old) will complete five times sit to stand test in < 15 seconds indicating an increased LE strength and improved balance. 08/25/15   PT LONG TERM GOAL #3   Title . Patient will increase 10 meter walk test to >1.45m/s as to improve gait speed for better community ambulation and to reduce fall risk 08/25/15   PT LONG TERM GOAL #4   Title Patient will be independent with ascend/descend 12 steps using single UE in step over step pattern without LOB. 08/25/15               Plan - 08/17/15 1403    Clinical Impression Statement PT provided min - moderate verbal instruction to improve set up, proper use of LE, and improved posture and gait mechanics. Patient responded moderately to instruction  Problem List Patient Active Problem List   Diagnosis Date Noted  . Herpes simplex infection 07/07/2015  . Anxiety disorder 01/29/2015  . Atrophic vaginitis 01/29/2015  . Dyslipidemia 01/29/2015  . Essential (primary) hypertension 01/29/2015  . Acid reflux 01/29/2015  . Depression, major, single episode, in partial remission ([place]) 01/29/2015  . Idiopathic peripheral neuropathy ([place]) 01/29/2015  . Idiopathic insomnia 01/29/2015  . Rheumatoid arthritis involving multiple joints ([place]) 01/29/2015  . Urge incontinence 01/29/2015  . Pelvic relaxation due to vaginal prolapse 01/29/2015    [name] 08/17/2015, 2:05 PM  [place] MAIN [facility] SERVICES [address] [place], [place], [address] Phone: [phone]   Fax:  [phone]  Name: SALONI HEDGEPETH MRN: [id number] Date of Birth: 1941-09-07

## 2015-08-22 ENCOUNTER — Ambulatory Visit: Payer: PPO | Admitting: Physical Therapy

## 2015-08-22 ENCOUNTER — Encounter: Payer: Self-pay | Admitting: Physical Therapy

## 2015-08-22 DIAGNOSIS — R202 Paresthesia of skin: Secondary | ICD-10-CM

## 2015-08-22 DIAGNOSIS — R200 Anesthesia of skin: Secondary | ICD-10-CM

## 2015-08-22 DIAGNOSIS — R262 Difficulty in walking, not elsewhere classified: Secondary | ICD-10-CM

## 2015-08-22 DIAGNOSIS — R2681 Unsteadiness on feet: Secondary | ICD-10-CM

## 2015-08-22 NOTE — Therapy (Signed)
[place] MAIN [facility] SERVICES [address] [place], [place], [address] Phone: [phone]   Fax:  [phone]  Physical Therapy Treatment  Patient Details  Name: WAYLYN PROSISE MRN: [id number] Date of Birth: 02-13-1941 Referring Provider: Manuella Ghazi  Encounter Date: 08/22/2015      PT End of Session - 08/22/15 1413    Visit Number 11   Date for PT Re-Evaluation 08/25/15   Authorization Type 11   PT Start Time 0145   PT Stop Time 0230   PT Time Calculation (min) 45 min      Past Medical History  Diagnosis Date  . Hypertension   . Neuropathy ([place])   . Rheumatoid arteritis   . H/O total hysterectomy     Past Surgical History  Procedure Laterality Date  . Total knee arthroplasty Left 2011  . Riight shoulder surgery Right 1999  . Shoulder surgery Left 2006  . Lumbar disc surgery  2011  . Appendectomy  1960  . Vaginal hysterectomy  04/2015  . Anterior and posterior vaginal repair  04/2015    There were no vitals filed for this visit.  Visit Diagnosis:  No diagnosis found.      Subjective Assessment - 08/22/15 1412    Subjective Patient is doing better today but has unsteady balance.       OUTCOME MEASURES: TEST Outcome Interpretation  5 times sit<>stand 11.17sec >60 yo, >15 sec indicates increased risk for falls  10 meter walk test  1.0 m/s <1.0 m/s indicates increased risk for falls; limited community ambulator  Timed up and Go  10.20 sec <14 sec indicates increased risk for falls  6 minute walk test 800 Feet 1000 feet is community Water quality scientist 43/56 <36/56 (100% risk for falls), 37-45 (80% risk for falls); 46-51 (>50% risk for falls); 52-55 (lower risk <25% of falls)         standing hip abd with YTB x 20  side stepping left and right in parallel bars 10 feet x 3 standing on blue foam with cone reaching x 20 across midline step ups from floor  to 6 inch stool x 20 bilateral sit to stand x 10 marching in parallel bars x 20 stepping pattern with weight shifting fwd/bwd x 10.                             PT Education - 08/22/15 1413    Education provided Yes   Education Details HEP   Person(s) Educated Patient   Methods Explanation   Comprehension Verbalized understanding             PT Long Term Goals - 06/30/15 1017    PT LONG TERM GOAL #1   Title Patient will be independent in home exercise program to improve strength/mobility for better functional independence with ADLs 08/25/15   PT LONG TERM GOAL #2   Title Patient (74 years old) will complete five times sit to stand test in < 15 seconds indicating an increased LE strength and improved balance. 08/25/15   PT LONG TERM GOAL #3   Title . Patient will increase 10 meter walk test to >1.1m/s as to improve gait speed for better community ambulation and to reduce fall risk 08/25/15   PT LONG TERM GOAL #4   Title Patient will be independent with ascend/descend 12 steps using single UE in step over step pattern without LOB. 08/25/15  Problem List Patient Active Problem List   Diagnosis Date Noted  . Herpes simplex infection 07/07/2015  . Anxiety disorder 01/29/2015  . Atrophic vaginitis 01/29/2015  . Dyslipidemia 01/29/2015  . Essential (primary) hypertension 01/29/2015  . Acid reflux 01/29/2015  . Depression, major, single episode, in partial remission ([place]) 01/29/2015  . Idiopathic peripheral neuropathy ([place]) 01/29/2015  . Idiopathic insomnia 01/29/2015  . Rheumatoid arthritis involving multiple joints ([place]) 01/29/2015  . Urge incontinence 01/29/2015  . Pelvic relaxation due to vaginal prolapse 01/29/2015    [name] 08/22/2015, 2:16 PM  [place] MAIN [facility] SERVICES [address] [place], [place], [address] Phone: [phone]   Fax:  [phone]  Name:  JACQULINE SHINES MRN: [id number] Date of Birth: 12/27/1940

## 2015-08-23 DIAGNOSIS — D485 Neoplasm of uncertain behavior of skin: Secondary | ICD-10-CM | POA: Diagnosis not present

## 2015-08-24 ENCOUNTER — Ambulatory Visit: Payer: PPO | Admitting: Physical Therapy

## 2015-08-24 ENCOUNTER — Encounter: Payer: Self-pay | Admitting: Physical Therapy

## 2015-08-24 DIAGNOSIS — R200 Anesthesia of skin: Secondary | ICD-10-CM

## 2015-08-24 DIAGNOSIS — R2681 Unsteadiness on feet: Secondary | ICD-10-CM

## 2015-08-24 DIAGNOSIS — R262 Difficulty in walking, not elsewhere classified: Secondary | ICD-10-CM

## 2015-08-24 DIAGNOSIS — R202 Paresthesia of skin: Secondary | ICD-10-CM

## 2015-08-24 NOTE — Therapy (Signed)
[place] MAIN [facility] SERVICES [address] [place], [place], [address] Phone: [phone]   Fax:  [phone]  Physical Therapy Treatment/DC summary  Patient Details  Name: Caroline Conway MRN: [id number] Date of Birth: 11/15/1940 Referring Provider: Manuella Ghazi  Encounter Date: 08/24/2015      PT End of Session - 08/24/15 1414    Visit Number 12   Date for PT Re-Evaluation 08/25/15   Authorization Type 12   PT Start Time 1210      Past Medical History  Diagnosis Date  . Hypertension   . Neuropathy ([place])   . Rheumatoid arteritis   . H/O total hysterectomy     Past Surgical History  Procedure Laterality Date  . Total knee arthroplasty Left 2011  . Riight shoulder surgery Right 1999  . Shoulder surgery Left 2006  . Lumbar disc surgery  2011  . Appendectomy  1960  . Vaginal hysterectomy  04/2015  . Anterior and posterior vaginal repair  04/2015    There were no vitals filed for this visit.  Visit Diagnosis:  No diagnosis found.      Subjective Assessment - 08/24/15 1413    Subjective Patient is doing better today but has unsteady balance. She is ready to be DC and is doing her HEP.    Currently in Pain? No/denies        standing hip abd with YTB x 20  side stepping left and right in parallel bars  With TYB 10 feet x 3 standing on blue foam with cone reaching x 20 across midline step ups from floor to 6 inch stool x 20 bilateral sit to stand x 10 marching in parallel bars, tapping to stool  From blue foam x 20 Matrix fwd/bwd/side stepping 17 lbs x 5 each way.   Muscle fatigue but no major pain complaints.                          PT Education - 08/24/15 1414    Education provided Yes   Education Details REviewed HEP   Person(s) Educated Patient   Methods Explanation   Comprehension Verbalized understanding             PT Long Term Goals - 06/30/15 1017    PT LONG TERM GOAL #1   Title  Patient will be independent in home exercise program to improve strength/mobility for better functional independence with ADLs 08/25/15   PT LONG TERM GOAL #2   Title Patient (> 55 years old) will complete five times sit to stand test in < 15 seconds indicating an increased LE strength and improved balance. 08/25/15   PT LONG TERM GOAL #3   Title . Patient will increase 10 meter walk test to >1.34m/s as to improve gait speed for better community ambulation and to reduce fall risk 08/25/15   PT LONG TERM GOAL #4   Title Patient will be independent with ascend/descend 12 steps using single UE in step over step pattern without LOB. 08/25/15               Problem List Patient Active Problem List   Diagnosis Date Noted  . Herpes simplex infection 07/07/2015  . Anxiety disorder 01/29/2015  . Atrophic vaginitis 01/29/2015  . Dyslipidemia 01/29/2015  . Essential (primary) hypertension 01/29/2015  . Acid reflux 01/29/2015  . Depression, major, single episode, in partial remission ([place]) 01/29/2015  . Idiopathic peripheral neuropathy ([place]) 01/29/2015  .  Idiopathic insomnia 01/29/2015  . Rheumatoid arthritis involving multiple joints ([place]) 01/29/2015  . Urge incontinence 01/29/2015  . Pelvic relaxation due to vaginal prolapse 01/29/2015    [name] 08/24/2015, 2:15 PM  [place] MAIN [facility] SERVICES [address] [place], [place], [address] Phone: [phone]   Fax:  [phone]  Name: Caroline Conway MRN: [id number] Date of Birth: 1940/10/19

## 2015-10-06 DIAGNOSIS — M0579 Rheumatoid arthritis with rheumatoid factor of multiple sites without organ or systems involvement: Secondary | ICD-10-CM | POA: Diagnosis not present

## 2015-10-06 DIAGNOSIS — M159 Polyosteoarthritis, unspecified: Secondary | ICD-10-CM | POA: Diagnosis not present

## 2015-10-06 DIAGNOSIS — M0570 Rheumatoid arthritis with rheumatoid factor of unspecified site without organ or systems involvement: Secondary | ICD-10-CM | POA: Diagnosis not present

## 2015-10-06 DIAGNOSIS — M810 Age-related osteoporosis without current pathological fracture: Secondary | ICD-10-CM | POA: Diagnosis not present

## 2015-10-07 ENCOUNTER — Other Ambulatory Visit: Payer: Self-pay | Admitting: Internal Medicine

## 2015-10-07 DIAGNOSIS — Z1231 Encounter for screening mammogram for malignant neoplasm of breast: Secondary | ICD-10-CM

## 2015-10-12 ENCOUNTER — Ambulatory Visit
Admission: RE | Admit: 2015-10-12 | Discharge: 2015-10-12 | Disposition: A | Discharge status: Home / Self Care | Payer: PPO | Source: Ambulatory Visit | Attending: Internal Medicine | Admitting: Internal Medicine

## 2015-10-12 DIAGNOSIS — Z1231 Encounter for screening mammogram for malignant neoplasm of breast: Secondary | ICD-10-CM | POA: Diagnosis not present

## 2015-10-12 HISTORY — DX: Malignant (primary) neoplasm, unspecified: C80.1

## 2015-11-04 DIAGNOSIS — M069 Rheumatoid arthritis, unspecified: Secondary | ICD-10-CM | POA: Diagnosis not present

## 2015-11-04 DIAGNOSIS — E782 Mixed hyperlipidemia: Secondary | ICD-10-CM | POA: Diagnosis not present

## 2015-11-04 DIAGNOSIS — Z0000 Encounter for general adult medical examination without abnormal findings: Secondary | ICD-10-CM | POA: Diagnosis not present

## 2015-11-04 DIAGNOSIS — I361 Nonrheumatic tricuspid (valve) insufficiency: Secondary | ICD-10-CM | POA: Diagnosis not present

## 2015-11-04 DIAGNOSIS — F324 Major depressive disorder, single episode, in partial remission: Secondary | ICD-10-CM | POA: Diagnosis not present

## 2015-11-04 DIAGNOSIS — I10 Essential (primary) hypertension: Secondary | ICD-10-CM | POA: Diagnosis not present

## 2015-11-04 DIAGNOSIS — G5793 Unspecified mononeuropathy of bilateral lower limbs: Secondary | ICD-10-CM | POA: Diagnosis not present

## 2015-11-04 DIAGNOSIS — Z6828 Body mass index (BMI) 28.0-28.9, adult: Secondary | ICD-10-CM | POA: Diagnosis not present

## 2015-11-06 ENCOUNTER — Other Ambulatory Visit: Payer: Self-pay | Admitting: Internal Medicine

## 2015-11-15 ENCOUNTER — Other Ambulatory Visit: Payer: Self-pay | Admitting: Internal Medicine

## 2015-11-16 DIAGNOSIS — R8290 Unspecified abnormal findings in urine: Secondary | ICD-10-CM | POA: Diagnosis not present

## 2015-11-30 DIAGNOSIS — H2513 Age-related nuclear cataract, bilateral: Secondary | ICD-10-CM | POA: Diagnosis not present

## 2015-12-12 DIAGNOSIS — R8290 Unspecified abnormal findings in urine: Secondary | ICD-10-CM | POA: Diagnosis not present

## 2015-12-13 ENCOUNTER — Other Ambulatory Visit: Payer: Self-pay | Admitting: Internal Medicine

## 2015-12-15 DIAGNOSIS — M159 Polyosteoarthritis, unspecified: Secondary | ICD-10-CM | POA: Diagnosis not present

## 2015-12-15 DIAGNOSIS — M810 Age-related osteoporosis without current pathological fracture: Secondary | ICD-10-CM | POA: Diagnosis not present

## 2015-12-15 DIAGNOSIS — Z79899 Other long term (current) drug therapy: Secondary | ICD-10-CM | POA: Diagnosis not present

## 2015-12-15 DIAGNOSIS — M0579 Rheumatoid arthritis with rheumatoid factor of multiple sites without organ or systems involvement: Secondary | ICD-10-CM | POA: Diagnosis not present

## 2016-01-04 DIAGNOSIS — H40003 Preglaucoma, unspecified, bilateral: Secondary | ICD-10-CM | POA: Diagnosis not present

## 2016-01-11 DIAGNOSIS — N3946 Mixed incontinence: Secondary | ICD-10-CM | POA: Diagnosis not present

## 2016-01-11 DIAGNOSIS — N390 Urinary tract infection, site not specified: Secondary | ICD-10-CM | POA: Diagnosis not present

## 2016-02-17 DIAGNOSIS — M5136 Other intervertebral disc degeneration, lumbar region: Secondary | ICD-10-CM | POA: Diagnosis not present

## 2016-02-17 DIAGNOSIS — M7062 Trochanteric bursitis, left hip: Secondary | ICD-10-CM | POA: Diagnosis not present

## 2016-02-17 DIAGNOSIS — M25552 Pain in left hip: Secondary | ICD-10-CM | POA: Diagnosis not present

## 2016-02-27 DIAGNOSIS — L821 Other seborrheic keratosis: Secondary | ICD-10-CM | POA: Diagnosis not present

## 2016-02-27 DIAGNOSIS — D225 Melanocytic nevi of trunk: Secondary | ICD-10-CM | POA: Diagnosis not present

## 2016-02-27 DIAGNOSIS — L570 Actinic keratosis: Secondary | ICD-10-CM | POA: Diagnosis not present

## 2016-02-27 DIAGNOSIS — Z1283 Encounter for screening for malignant neoplasm of skin: Secondary | ICD-10-CM | POA: Diagnosis not present

## 2016-03-21 DIAGNOSIS — M0579 Rheumatoid arthritis with rheumatoid factor of multiple sites without organ or systems involvement: Secondary | ICD-10-CM | POA: Diagnosis not present

## 2016-03-21 DIAGNOSIS — Z79899 Other long term (current) drug therapy: Secondary | ICD-10-CM | POA: Diagnosis not present

## 2016-03-21 DIAGNOSIS — M159 Polyosteoarthritis, unspecified: Secondary | ICD-10-CM | POA: Diagnosis not present

## 2016-03-21 DIAGNOSIS — M810 Age-related osteoporosis without current pathological fracture: Secondary | ICD-10-CM | POA: Diagnosis not present

## 2016-04-23 ENCOUNTER — Other Ambulatory Visit: Payer: Self-pay | Admitting: Internal Medicine

## 2016-04-23 NOTE — Telephone Encounter (Signed)
pts coming in on 8/22

## 2016-05-01 ENCOUNTER — Encounter: Payer: Self-pay | Admitting: Internal Medicine

## 2016-05-01 ENCOUNTER — Ambulatory Visit (INDEPENDENT_AMBULATORY_CARE_PROVIDER_SITE_OTHER): Payer: PPO | Admitting: Internal Medicine

## 2016-05-01 VITALS — BP 115/78 | HR 72 | Resp 16 | Ht 60.0 in | Wt 147.0 lb

## 2016-05-01 DIAGNOSIS — I10 Essential (primary) hypertension: Secondary | ICD-10-CM

## 2016-05-01 DIAGNOSIS — F324 Major depressive disorder, single episode, in partial remission: Secondary | ICD-10-CM

## 2016-05-01 DIAGNOSIS — K219 Gastro-esophageal reflux disease without esophagitis: Secondary | ICD-10-CM | POA: Diagnosis not present

## 2016-05-01 DIAGNOSIS — M069 Rheumatoid arthritis, unspecified: Secondary | ICD-10-CM

## 2016-05-01 DIAGNOSIS — E785 Hyperlipidemia, unspecified: Secondary | ICD-10-CM

## 2016-05-01 DIAGNOSIS — F5101 Primary insomnia: Secondary | ICD-10-CM | POA: Diagnosis not present

## 2016-05-01 MED ORDER — HYDROCHLOROTHIAZIDE 25 MG PO TABS: 50.0000 mg | ORAL_TABLET | Freq: Every day | ORAL | 12 refills | Status: DC

## 2016-05-01 MED ORDER — ZOLPIDEM TARTRATE 5 MG PO TABS: 5.0000 mg | ORAL_TABLET | Freq: Every day | ORAL | 5 refills | Status: DC

## 2016-05-01 MED ORDER — SIMVASTATIN 20 MG PO TABS: 20.0000 mg | ORAL_TABLET | Freq: Every day | ORAL | 12 refills | Status: DC

## 2016-05-01 NOTE — Progress Notes (Signed)
Date:  05/01/2016   Name:  Caroline Conway   DOB:  Aug 28, 1941   MRN:  [id number]   Chief Complaint: Hypertension Hypertension  This is a chronic problem. The current episode started more than 1 year ago. The problem is unchanged. The problem is controlled. Pertinent negatives include no chest pain, headaches, palpitations or shortness of breath. There are no compliance problems.   Gastroesophageal Reflux  She reports no abdominal pain, no chest pain, no choking, no heartburn or no water brash. The problem occurs occasionally. Pertinent negatives include no fatigue. She has tried a PPI for the symptoms.  Hyperlipidemia  This is a chronic problem. Pertinent negatives include no chest pain or shortness of breath. Current antihyperlipidemic treatment includes statins.   RA - still on MTX and recently on Prednisone.  Trying to get her approved for Morrie Sheldon. Doing fairly well on current therapy.  Gets labs done every 3 months.  Urinary incontinence - her biggest complaint is persistent urine leakage despite surgery.  She continues to get UTIs and is taking medications now.  Review of Systems  Constitutional: Negative for chills, fatigue and fever.  HENT: Negative for trouble swallowing.   Eyes: Negative for visual disturbance.  Respiratory: Negative for choking, chest tightness and shortness of breath.   Cardiovascular: Negative for chest pain, palpitations and leg swelling.  Gastrointestinal: Negative for abdominal pain, blood in stool, constipation and heartburn.  Genitourinary: Positive for difficulty urinating, dysuria and frequency.  Musculoskeletal: Positive for arthralgias, gait problem and joint swelling.  Skin: Positive for wound. Negative for rash.  Neurological: Negative for dizziness and headaches.  Hematological: Negative for adenopathy.  Psychiatric/Behavioral: Positive for sleep disturbance. Negative for dysphoric mood. The patient is not nervous/anxious.     Patient  Active Problem List   Diagnosis Date Noted  . Herpes simplex infection 07/07/2015  . Anxiety disorder 01/29/2015  . Atrophic vaginitis 01/29/2015  . Dyslipidemia 01/29/2015  . Essential (primary) hypertension 01/29/2015  . Acid reflux 01/29/2015  . Depression, major, single episode, in partial remission ([place]) 01/29/2015  . Idiopathic peripheral neuropathy ([place]) 01/29/2015  . Idiopathic insomnia 01/29/2015  . Rheumatoid arthritis involving multiple joints ([place]) 01/29/2015  . Urge incontinence 01/29/2015  . Pelvic relaxation due to vaginal prolapse 01/29/2015    Prior to Admission medications   Medication Sig Start Date End Date Taking? Authorizing Provider  atenolol (TENORMIN) 50 MG tablet TAKE 1 TABLET BY MOUTH DAILY 06/29/15  Yes Glean Hess, MD  DULoxetine (CYMBALTA) 20 MG capsule TAKE 1 CAPSULE (20 MG TOTAL) BY MOUTH ONCE DAILY. 05/31/15  Yes Historical Provider, MD  esomeprazole (NEXIUM) 20 MG capsule Take 1 capsule by mouth daily.   Yes Historical Provider, MD  folic acid (FOLVITE) 1 MG tablet Take 1 tablet by mouth daily.   Yes Historical Provider, MD  gabapentin (NEURONTIN) 100 MG capsule TAKE 1 TABLET IN AM AND 2 TABLETS AT NIGHT 04/27/15  Yes Historical Provider, MD  hydrochlorothiazide (HYDRODIURIL) 25 MG tablet TAKE 2 TABLETS BY MOUTH DAILY 07/31/15  Yes Glean Hess, MD  ibuprofen (ADVIL,MOTRIN) 600 MG tablet TAKE 1 TABLET (600 MG TOTAL) BY MOUTH EVERY 6 (SIX) HOURS AS NEEDED FOR PAIN. 04/05/15  Yes Historical Provider, MD  meloxicam (MOBIC) 15 MG tablet Take 1 tablet by mouth daily.   Yes Historical Provider, MD  methotrexate 2.5 MG tablet Take 8 tablets by mouth once a week.   Yes Historical Provider, MD  mirtazapine (REMERON) 30 MG tablet TAKE 1 TABLET  BY MOUTH AT BEDTIME 04/23/16  Yes Glean Hess, MD  PREMARIN vaginal cream INSERT 1 APPLICATORFUL VAGINALLY AT BEDTIME 12/13/15  Yes Glean Hess, MD  simvastatin (ZOCOR) 20 MG tablet TAKE 1 TABLET BY MOUTH DAILY  08/15/15  Yes Glean Hess, MD  zolpidem (AMBIEN) 5 MG tablet TAKE 1 TABLET BY MOUTH AT BEDTIME 11/15/15  Yes Glean Hess, MD    Allergies  Allergen Reactions  . Codeine Sulfate Hives  . Oxycodone Itching  . Propoxyphene Hives  . Penicillins Rash  . Sulfa Antibiotics Rash    Past Surgical History:  Procedure Laterality Date  . ANTERIOR AND POSTERIOR VAGINAL REPAIR  04/2015  . APPENDECTOMY  1960  . BREAST CYST ASPIRATION Left    lt fna- neg  . [place] SURGERY  2011  . riight shoulder surgery Right 1999  . SHOULDER SURGERY Left 2006  . TOTAL KNEE ARTHROPLASTY Left 2011  . VAGINAL HYSTERECTOMY  04/2015    Social History  Substance Use Topics  . Smoking status: Never Smoker  . Smokeless tobacco: Never Used  . Alcohol use No     Medication list has been reviewed and updated.   Physical Exam  Constitutional: She is oriented to person, place, and time. She appears well-developed and well-nourished. No distress.  HENT:  Head: Normocephalic and atraumatic.  Neck: Normal range of motion. Neck supple. No thyromegaly present.  Cardiovascular: Normal rate, regular rhythm and normal heart sounds.   Pulmonary/Chest: Effort normal and breath sounds normal. No respiratory distress.  Abdominal: Soft.  Musculoskeletal: She exhibits no edema or tenderness.  RA changes of fingers, wrists and elbows - no active joints currently  Neurological: She is alert and oriented to person, place, and time.  Skin: Skin is warm and dry. Abrasion (right anterior shin - with eschar, no surrounding erythema) noted. No rash noted.  Psychiatric: She has a normal mood and affect. Her speech is normal and behavior is normal. Thought content normal.  Nursing note and vitals reviewed.   BP 115/78 (BP Location: Right Arm, Patient Position: Sitting, Cuff Size: Normal)   Pulse 72   Resp 16   Ht 5' (1.524 m)   Wt 147 lb (66.7 kg)   SpO2 97%   BMI 28.71 kg/m   Assessment and Plan: 1. Essential  (primary) hypertension controlled - hydrochlorothiazide (HYDRODIURIL) 25 MG tablet; Take 2 tablets (50 mg total) by mouth daily.  Dispense: 60 tablet; Refill: 12  2. Gastroesophageal reflux disease, esophagitis presence not specified Stable on PPI  3. Rheumatoid arthritis involving multiple joints ([place]) Followed by Rheum with regular labwork  4. Dyslipidemia On statin therapy - simvastatin (ZOCOR) 20 MG tablet; Take 1 tablet (20 mg total) by mouth daily.  Dispense: 30 tablet; Refill: 12  5. Depression, major, single episode, in partial remission ([place]) Doing well on current regimen  6. Idiopathic insomnia - zolpidem (AMBIEN) 5 MG tablet; Take 1 tablet (5 mg total) by mouth at bedtime.  Dispense: 30 tablet; Refill: [place], MD [facility]  05/01/2016

## 2016-05-01 NOTE — Patient Instructions (Signed)
Breast Self-Awareness Practicing breast self-awareness may pick up problems early, prevent significant medical complications, and possibly save your life. By practicing breast self-awareness, you can become familiar with how your breasts look and feel and if your breasts are changing. This allows you to notice changes early. It can also offer you some reassurance that your breast health is good. One way to learn what is normal for your breasts and whether your breasts are changing is to do a breast self-exam. If you find a lump or something that was not present in the past, it is best to contact your caregiver right away. Other findings that should be evaluated by your caregiver include nipple discharge, especially if it is bloody; skin changes or reddening; areas where the skin seems to be pulled in (retracted); or new lumps and bumps. Breast pain is seldom associated with cancer (malignancy), but should also be evaluated by a caregiver. HOW TO PERFORM A BREAST SELF-EXAM The best time to examine your breasts is 5-7 days after your menstrual period is over. During menstruation, the breasts are lumpier, and it may be more difficult to pick up changes. If you do not menstruate, have reached menopause, or had your uterus removed (hysterectomy), you should examine your breasts at regular intervals, such as monthly. If you are breastfeeding, examine your breasts after a feeding or after using a breast pump. Breast implants do not decrease the risk for lumps or tumors, so continue to perform breast self-exams as recommended. Talk to your caregiver about how to determine the difference between the implant and breast tissue. Also, talk about the amount of pressure you should use during the exam. Over time, you will become more familiar with the variations of your breasts and more comfortable with the exam. A breast self-exam requires you to remove all your clothes above the waist. 1. Look at your breasts and nipples.  Stand in front of a mirror in a room with good lighting. With your hands on your hips, push your hands firmly downward. Look for a difference in shape, contour, and size from one breast to the other (asymmetry). Asymmetry includes puckers, dips, or bumps. Also, look for skin changes, such as reddened or scaly areas on the breasts. Look for nipple changes, such as discharge, dimpling, repositioning, or redness. 2. Carefully feel your breasts. This is best done either in the shower or tub while using soapy water or when flat on your back. Place the arm (on the side of the breast you are examining) above your head. Use the pads (not the fingertips) of your three middle fingers on your opposite hand to feel your breasts. Start in the underarm area and use  inch (2 cm) overlapping circles to feel your breast. Use 3 different levels of pressure (light, medium, and firm pressure) at each circle before moving to the next circle. The light pressure is needed to feel the tissue closest to the skin. The medium pressure will help to feel breast tissue a little deeper, while the firm pressure is needed to feel the tissue close to the ribs. Continue the overlapping circles, moving downward over the breast until you feel your ribs below your breast. Then, move one finger-width towards the center of the body. Continue to use the  inch (2 cm) overlapping circles to feel your breast as you move slowly up toward the collar bone (clavicle) near the base of the neck. Continue the up and down exam using all 3 pressures until you reach the   middle of the chest. Do this with each breast, carefully feeling for lumps or changes. 3.  Keep a written record with breast changes or normal findings for each breast. By writing this information down, you do not need to depend only on memory for size, tenderness, or location. Write down where you are in your menstrual cycle, if you are still menstruating. Breast tissue can have some lumps or  thick tissue. However, see your caregiver if you find anything that concerns you.  SEEK MEDICAL CARE IF:  You see a change in shape, contour, or size of your breasts or nipples.   You see skin changes, such as reddened or scaly areas on the breasts or nipples.   You have an unusual discharge from your nipples.   You feel a new lump or unusually thick areas.    This information is not intended to replace advice given to you by your health care provider. Make sure you discuss any questions you have with your health care provider.   Document Released: 08/27/2005 Document Revised: 08/13/2012 Document Reviewed: 12/12/2011 Elsevier Interactive Patient Education 2016 [employer]vier Inc[employer]  

## 2016-05-04 DIAGNOSIS — M810 Age-related osteoporosis without current pathological fracture: Secondary | ICD-10-CM | POA: Diagnosis not present

## 2016-05-04 DIAGNOSIS — M7581 Other shoulder lesions, right shoulder: Secondary | ICD-10-CM | POA: Diagnosis not present

## 2016-05-04 DIAGNOSIS — M0579 Rheumatoid arthritis with rheumatoid factor of multiple sites without organ or systems involvement: Secondary | ICD-10-CM | POA: Diagnosis not present

## 2016-05-04 DIAGNOSIS — Z79899 Other long term (current) drug therapy: Secondary | ICD-10-CM | POA: Diagnosis not present

## 2016-05-04 DIAGNOSIS — M159 Polyosteoarthritis, unspecified: Secondary | ICD-10-CM | POA: Diagnosis not present

## 2016-05-23 DIAGNOSIS — Z79899 Other long term (current) drug therapy: Secondary | ICD-10-CM | POA: Diagnosis not present

## 2016-05-23 DIAGNOSIS — Z1389 Encounter for screening for other disorder: Secondary | ICD-10-CM | POA: Diagnosis not present

## 2016-05-25 ENCOUNTER — Other Ambulatory Visit: Payer: Self-pay | Admitting: Obstetrics and Gynecology

## 2016-05-25 DIAGNOSIS — R3129 Other microscopic hematuria: Secondary | ICD-10-CM

## 2016-05-30 ENCOUNTER — Ambulatory Visit: Payer: PPO

## 2016-05-31 ENCOUNTER — Ambulatory Visit
Admission: RE | Admit: 2016-05-31 | Discharge: 2016-05-31 | Disposition: A | Discharge status: Home / Self Care | Payer: PPO | Source: Ambulatory Visit | Attending: Obstetrics and Gynecology | Admitting: Obstetrics and Gynecology

## 2016-05-31 ENCOUNTER — Other Ambulatory Visit
Admission: RE | Admit: 2016-05-31 | Discharge: 2016-05-31 | Disposition: A | Discharge status: Home / Self Care | Payer: PPO | Source: Ambulatory Visit | Attending: Obstetrics and Gynecology | Admitting: Obstetrics and Gynecology

## 2016-05-31 DIAGNOSIS — R9341 Abnormal radiologic findings on diagnostic imaging of renal pelvis, ureter, or bladder: Secondary | ICD-10-CM | POA: Diagnosis not present

## 2016-05-31 DIAGNOSIS — I700 Atherosclerosis of aorta: Secondary | ICD-10-CM | POA: Diagnosis not present

## 2016-05-31 DIAGNOSIS — R3129 Other microscopic hematuria: Secondary | ICD-10-CM | POA: Diagnosis not present

## 2016-05-31 HISTORY — DX: Systemic involvement of connective tissue, unspecified: M35.9

## 2016-05-31 LAB — BASIC METABOLIC PANEL
ANION GAP: 7 (ref 5–15)
BUN: 19 mg/dL (ref 6–20)
CALCIUM: 8.8 mg/dL — AB (ref 8.9–10.3)
CHLORIDE: 94 mmol/L — AB (ref 101–111)
CO2: 27 mmol/L (ref 22–32)
CREATININE: 0.81 mg/dL (ref 0.44–1.00)
GFR calc non Af Amer: >60 mL/min (ref >60)
Glucose, Bld: 96 mg/dL (ref 65–99)
Potassium: 3.4 mmol/L — ABNORMAL LOW (ref 3.5–5.1)
SODIUM: 128 mmol/L — AB (ref 135–145)

## 2016-05-31 MED ORDER — IOPAMIDOL (ISOVUE-300) INJECTION 61%
150.0000 mL | Freq: Once | INTRAVENOUS | Status: AC | PRN
Start: 2016-05-31 — End: 2016-05-31
  Administered 2016-05-31: 125 mL via INTRAVENOUS

## 2016-06-07 ENCOUNTER — Ambulatory Visit (INDEPENDENT_AMBULATORY_CARE_PROVIDER_SITE_OTHER): Payer: PPO | Admitting: Internal Medicine

## 2016-06-07 ENCOUNTER — Encounter (INDEPENDENT_AMBULATORY_CARE_PROVIDER_SITE_OTHER): Payer: Self-pay

## 2016-06-07 ENCOUNTER — Encounter: Payer: Self-pay | Admitting: Internal Medicine

## 2016-06-07 ENCOUNTER — Other Ambulatory Visit: Payer: Self-pay | Admitting: Internal Medicine

## 2016-06-07 VITALS — BP 106/78 | HR 62 | Temp 98.7°F | Resp 16 | Ht 60.0 in | Wt 148.0 lb

## 2016-06-07 DIAGNOSIS — N3001 Acute cystitis with hematuria: Secondary | ICD-10-CM

## 2016-06-07 LAB — POC URINALYSIS WITH MICROSCOPIC (NON AUTO)MANUAL RESULT
Bilirubin, UA: NEGATIVE
Crystals: 0
EPITHELIAL CELLS, URINE PER MICROSCOPY: 2
GLUCOSE UA: NEGATIVE
KETONES UA: NEGATIVE
MUCUS UA: 0
NITRITE UA: POSITIVE
PROTEIN UA: NEGATIVE
RBC: 15 M/uL — AB (ref 4.04–5.48)
Spec Grav, UA: 1.01
Urobilinogen, UA: 0.2
WBC Casts, UA: 100
pH, UA: 5

## 2016-06-07 MED ORDER — NITROFURANTOIN MACROCRYSTAL 100 MG PO CAPS: 100.0000 mg | ORAL_CAPSULE | Freq: Two times a day (BID) | ORAL | 0 refills | Status: DC

## 2016-06-07 NOTE — Progress Notes (Signed)
Date:  06/07/2016   Name:  Caroline Conway   DOB:  [birth year]   MRN:  [id number]   Chief Complaint: Cystitis Shari Heritage and urgency x 3 weeks. [facility] sent for lower abdomina CT. Will get Upper with Scope done tomorrow and having upper andominal pain. t)  Urinary Tract Infection   This is a new problem. The current episode started 1 to 4 weeks ago. The problem occurs every urination. The problem has been gradually worsening. The quality of the pain is described as burning. The pain is mild. There has been no fever. Associated symptoms include chills, frequency, nausea and urgency. Pertinent negatives include no flank pain, hematuria, sweats or vomiting. Her past medical history is significant for recurrent UTIs.     Review of Systems  Constitutional: Positive for chills.  Respiratory: Negative for chest tightness and shortness of breath.   Cardiovascular: Negative for chest pain.  Gastrointestinal: Positive for nausea. Negative for vomiting.  Genitourinary: Positive for frequency and urgency. Negative for flank pain and hematuria.    Patient Active Problem List   Diagnosis Date Noted  . Herpes simplex infection 07/07/2015  . Anxiety disorder 01/29/2015  . Atrophic vaginitis 01/29/2015  . Dyslipidemia 01/29/2015  . Essential (primary) hypertension 01/29/2015  . Acid reflux 01/29/2015  . Depression, major, single episode, in partial remission ([place]) 01/29/2015  . Idiopathic peripheral neuropathy ([place]) 01/29/2015  . Idiopathic insomnia 01/29/2015  . Rheumatoid arthritis involving multiple joints ([place]) 01/29/2015  . Urge incontinence 01/29/2015  . Pelvic relaxation due to vaginal prolapse 01/29/2015    Prior to Admission medications   Medication Sig Start Date End Date Taking? Authorizing Provider  atenolol (TENORMIN) 50 MG tablet TAKE 1 TABLET BY MOUTH DAILY 06/29/15  Yes Glean Hess, MD  DULoxetine (CYMBALTA) 20 MG capsule TAKE 1 CAPSULE (20 MG TOTAL) BY MOUTH ONCE  DAILY. 05/31/15  Yes Historical Provider, MD  esomeprazole (NEXIUM) 20 MG capsule Take 1 capsule by mouth daily.   Yes Historical Provider, MD  folic acid (FOLVITE) 1 MG tablet Take 1 tablet by mouth daily.   Yes Historical Provider, MD  gabapentin (NEURONTIN) 100 MG capsule TAKE 1 TABLET IN AM AND 2 TABLETS AT NIGHT 04/27/15  Yes Historical Provider, MD  hydrochlorothiazide (HYDRODIURIL) 25 MG tablet Take 2 tablets (50 mg total) by mouth daily. 05/01/16  Yes Glean Hess, MD  ibuprofen (ADVIL,MOTRIN) 600 MG tablet TAKE 1 TABLET (600 MG TOTAL) BY MOUTH EVERY 6 (SIX) HOURS AS NEEDED FOR PAIN. 04/05/15  Yes Historical Provider, MD  meloxicam (MOBIC) 15 MG tablet Take 1 tablet by mouth daily.   Yes Historical Provider, MD  methotrexate 2.5 MG tablet Take 8 tablets by mouth once a week.   Yes Historical Provider, MD  mirtazapine (REMERON) 30 MG tablet TAKE 1 TABLET BY MOUTH AT BEDTIME 04/23/16  Yes Glean Hess, MD  PREMARIN vaginal cream INSERT 1 APPLICATORFUL VAGINALLY AT BEDTIME 12/13/15  Yes Glean Hess, MD  simvastatin (ZOCOR) 20 MG tablet Take 1 tablet (20 mg total) by mouth daily. 05/01/16  Yes Glean Hess, MD  zolpidem (AMBIEN) 5 MG tablet Take 1 tablet (5 mg total) by mouth at bedtime. 05/01/16  Yes Glean Hess, MD    Allergies  Allergen Reactions  . Codeine Sulfate Hives  . Oxycodone Itching  . Propoxyphene Hives  . Penicillins Rash  . Sulfa Antibiotics Rash    Past Surgical History:  Procedure Laterality Date  . ANTERIOR AND POSTERIOR  VAGINAL REPAIR  04/2015  . APPENDECTOMY  1960  . BREAST CYST ASPIRATION Left    lt fna- neg  . [place] SURGERY  2011  . riight shoulder surgery Right 1999  . SHOULDER SURGERY Left 2006  . TOTAL KNEE ARTHROPLASTY Left 2011  . VAGINAL HYSTERECTOMY  04/2015    Social History  Substance Use Topics  . Smoking status: Never Smoker  . Smokeless tobacco: Never Used  . Alcohol use No     Medication list has been reviewed and  updated.   Physical Exam  Constitutional: She appears well-developed and well-nourished.  Cardiovascular: Normal rate, regular rhythm and normal heart sounds.   Pulmonary/Chest: Effort normal and breath sounds normal. No respiratory distress.  Abdominal: Soft. Bowel sounds are normal. There is tenderness in the suprapubic area. There is no rebound, no guarding and no CVA tenderness.  Psychiatric: She has a normal mood and affect.  Nursing note and vitals reviewed.   BP 106/78   Pulse 62   Temp 98.7 F (37.1 C) (Oral)   Resp 16   Ht 5' (1.524 m)   Wt 148 lb (67.1 kg)   SpO2 97%   BMI 28.90 kg/m   Assessment and Plan: 1. Acute cystitis with hematuria Most recent Cx sens to nitrofurantoin Cystoscopy tomorrow with Urogynecology - nitrofurantoin (MACRODANTIN) 100 MG capsule; Take 1 capsule (100 mg total) by mouth 2 (two) times daily.  Dispense: 28 capsule; Refill: 0 - Urine culture - POC urinalysis w microscopic (non auto)   Halina Maidens, MD [facility]  06/07/2016

## 2016-06-09 LAB — URINE CULTURE

## 2016-06-15 DIAGNOSIS — R3129 Other microscopic hematuria: Secondary | ICD-10-CM | POA: Diagnosis not present

## 2016-06-15 DIAGNOSIS — Z1389 Encounter for screening for other disorder: Secondary | ICD-10-CM | POA: Diagnosis not present

## 2016-06-18 DIAGNOSIS — M2011 Hallux valgus (acquired), right foot: Secondary | ICD-10-CM | POA: Diagnosis not present

## 2016-06-18 DIAGNOSIS — M2041 Other hammer toe(s) (acquired), right foot: Secondary | ICD-10-CM | POA: Diagnosis not present

## 2016-06-18 DIAGNOSIS — B351 Tinea unguium: Secondary | ICD-10-CM | POA: Diagnosis not present

## 2016-06-21 DIAGNOSIS — M159 Polyosteoarthritis, unspecified: Secondary | ICD-10-CM | POA: Diagnosis not present

## 2016-06-21 DIAGNOSIS — Z79899 Other long term (current) drug therapy: Secondary | ICD-10-CM | POA: Diagnosis not present

## 2016-06-21 DIAGNOSIS — M810 Age-related osteoporosis without current pathological fracture: Secondary | ICD-10-CM | POA: Diagnosis not present

## 2016-06-21 DIAGNOSIS — M7581 Other shoulder lesions, right shoulder: Secondary | ICD-10-CM | POA: Diagnosis not present

## 2016-06-21 DIAGNOSIS — Z23 Encounter for immunization: Secondary | ICD-10-CM | POA: Diagnosis not present

## 2016-06-21 DIAGNOSIS — M0579 Rheumatoid arthritis with rheumatoid factor of multiple sites without organ or systems involvement: Secondary | ICD-10-CM | POA: Diagnosis not present

## 2016-06-22 DIAGNOSIS — R2689 Other abnormalities of gait and mobility: Secondary | ICD-10-CM | POA: Diagnosis not present

## 2016-06-22 DIAGNOSIS — R296 Repeated falls: Secondary | ICD-10-CM | POA: Diagnosis not present

## 2016-06-22 DIAGNOSIS — G6289 Other specified polyneuropathies: Secondary | ICD-10-CM | POA: Diagnosis not present

## 2016-07-05 DIAGNOSIS — N390 Urinary tract infection, site not specified: Secondary | ICD-10-CM | POA: Diagnosis not present

## 2016-07-05 DIAGNOSIS — K5901 Slow transit constipation: Secondary | ICD-10-CM | POA: Diagnosis not present

## 2016-07-05 DIAGNOSIS — R3129 Other microscopic hematuria: Secondary | ICD-10-CM | POA: Diagnosis not present

## 2016-07-12 ENCOUNTER — Ambulatory Visit: Payer: PPO | Admitting: Internal Medicine

## 2016-07-13 ENCOUNTER — Other Ambulatory Visit: Payer: Self-pay | Admitting: Internal Medicine

## 2016-07-13 ENCOUNTER — Encounter: Payer: Self-pay | Admitting: Internal Medicine

## 2016-07-13 ENCOUNTER — Ambulatory Visit (INDEPENDENT_AMBULATORY_CARE_PROVIDER_SITE_OTHER): Payer: PPO | Admitting: Internal Medicine

## 2016-07-13 VITALS — BP 115/78 | HR 68 | Resp 16 | Ht 60.0 in | Wt 147.6 lb

## 2016-07-13 DIAGNOSIS — N3000 Acute cystitis without hematuria: Secondary | ICD-10-CM

## 2016-07-13 DIAGNOSIS — F5101 Primary insomnia: Secondary | ICD-10-CM

## 2016-07-13 LAB — POC URINALYSIS WITH MICROSCOPIC (NON AUTO)MANUAL RESULT
Bilirubin, UA: NEGATIVE
CRYSTALS: 0
Epithelial cells, urine per micros: 0
GLUCOSE UA: NEGATIVE
Ketones, UA: NEGATIVE
MUCUS UA: 0
Nitrite, UA: POSITIVE
PH UA: 7.5
RBC: 10 M/uL — AB (ref 4.04–5.48)
SPEC GRAV UA: 1.01
Urobilinogen, UA: 0.2

## 2016-07-13 MED ORDER — NITROFURANTOIN MACROCRYSTAL 100 MG PO CAPS: 100.0000 mg | ORAL_CAPSULE | Freq: Four times a day (QID) | ORAL | 0 refills | Status: DC

## 2016-07-13 NOTE — Progress Notes (Signed)
Date:  07/13/2016   Name:  Caroline Conway   DOB:  [birth year]   MRN:  [id number]   Chief Complaint: Cystitis Dysuria   This is a recurrent problem. The current episode started in the past 7 days. The problem has been gradually worsening. The quality of the pain is described as aching. The patient is experiencing no pain. There has been no fever. Associated symptoms include chills, frequency and urgency. Pertinent negatives include no hematuria.  Insomnia  Primary symptoms: difficulty falling asleep, frequent awakening.  The onset quality is undetermined. Prior diagnostic workup includes:  No prior workup.   Review of UCx over the past year - mostly E Coli with extensive sensitivities.   Uro-gyn does not want to put her on daily antibiotics.  They started Detrol and are contemplating Botox.   Review of Systems  Constitutional: Positive for chills. Negative for fever and unexpected weight change.  Respiratory: Negative for chest tightness and shortness of breath.   Cardiovascular: Negative for chest pain, palpitations and leg swelling.  Gastrointestinal: Negative for abdominal pain.  Genitourinary: Positive for dysuria, frequency and urgency. Negative for hematuria.  Neurological: Negative for dizziness and headaches.  Psychiatric/Behavioral: The patient has insomnia.     Patient Active Problem List   Diagnosis Date Noted  . Herpes simplex infection 07/07/2015  . Anxiety disorder 01/29/2015  . Atrophic vaginitis 01/29/2015  . Dyslipidemia 01/29/2015  . Essential (primary) hypertension 01/29/2015  . Acid reflux 01/29/2015  . Depression, major, single episode, in partial remission ([place]) 01/29/2015  . Idiopathic peripheral neuropathy 01/29/2015  . Idiopathic insomnia 01/29/2015  . Rheumatoid arthritis involving multiple joints ([place]) 01/29/2015  . Urge incontinence 01/29/2015  . Pelvic relaxation due to vaginal prolapse 01/29/2015    Prior to Admission medications   Medication  Sig Start Date End Date Taking? Authorizing Provider  atenolol (TENORMIN) 50 MG tablet TAKE 1 TABLET BY MOUTH DAILY 06/29/15  Yes Glean Hess, MD  DULoxetine (CYMBALTA) 20 MG capsule Take by mouth. 06/22/16 06/22/17 Yes Historical Provider, MD  esomeprazole (NEXIUM) 20 MG capsule Take 1 capsule by mouth daily.   Yes Historical Provider, MD  folic acid (FOLVITE) 1 MG tablet Take 1 tablet by mouth daily.   Yes Historical Provider, MD  hydrochlorothiazide (HYDRODIURIL) 25 MG tablet Take 2 tablets (50 mg total) by mouth daily. 05/01/16  Yes Glean Hess, MD  ibuprofen (ADVIL,MOTRIN) 600 MG tablet TAKE 1 TABLET (600 MG TOTAL) BY MOUTH EVERY 6 (SIX) HOURS AS NEEDED FOR PAIN. 04/05/15  Yes Historical Provider, MD  meloxicam (MOBIC) 15 MG tablet Take 1 tablet by mouth daily.   Yes Historical Provider, MD  methotrexate 2.5 MG tablet Take 8 tablets by mouth once a week.   Yes Historical Provider, MD  mirtazapine (REMERON) 30 MG tablet TAKE 1 TABLET BY MOUTH AT BEDTIME 04/23/16  Yes Glean Hess, MD  PREMARIN vaginal cream INSERT 1 APPLICATORFUL VAGINALLY AT BEDTIME 12/13/15  Yes Glean Hess, MD  simvastatin (ZOCOR) 20 MG tablet Take 1 tablet (20 mg total) by mouth daily. 05/01/16  Yes Glean Hess, MD  Tofacitinib Citrate 11 MG TB24 Take by mouth.   Yes Historical Provider, MD  tolterodine (DETROL LA) 2 MG 24 hr capsule Take by mouth. 07/05/16 08/04/16 Yes Historical Provider, MD  zolpidem (AMBIEN) 5 MG tablet Take 1 tablet (5 mg total) by mouth at bedtime. 05/01/16  Yes Glean Hess, MD    Allergies  Allergen Reactions  .  Codeine Sulfate Hives  . Oxycodone Itching  . Propoxyphene Hives  . Penicillins Rash  . Sulfa Antibiotics Rash    Past Surgical History:  Procedure Laterality Date  . ANTERIOR AND POSTERIOR VAGINAL REPAIR  04/2015  . APPENDECTOMY  1960  . BREAST CYST ASPIRATION Left    lt fna- neg  . [place] SURGERY  2011  . riight shoulder surgery Right 1999  .  SHOULDER SURGERY Left 2006  . TOTAL KNEE ARTHROPLASTY Left 2011  . VAGINAL HYSTERECTOMY  04/2015    Social History  Substance Use Topics  . Smoking status: Never Smoker  . Smokeless tobacco: Never Used  . Alcohol use No     Medication list has been reviewed and updated.   Physical Exam  Constitutional: She is oriented to person, place, and time. She appears well-developed. No distress.  HENT:  Head: Normocephalic and atraumatic.  Cardiovascular: Normal rate and regular rhythm.   Pulmonary/Chest: Effort normal and breath sounds normal. No respiratory distress.  Abdominal: She exhibits no distension. There is tenderness. There is no CVA tenderness.  Musculoskeletal: Normal range of motion.  Neurological: She is alert and oriented to person, place, and time.  Skin: Skin is warm and dry. No rash noted.  Psychiatric: She has a normal mood and affect. Her behavior is normal. Thought content normal.  Nursing note and vitals reviewed.   BP 115/78   Pulse 68   Resp 16   Ht 5' (1.524 m)   Wt 147 lb 9.6 oz (67 kg)   SpO2 99%   BMI 28.83 kg/m   Assessment and Plan: 1. Acute cystitis without hematuria Begin Macrobid; consider daily therapy for suppression - POC urinalysis w microscopic (non auto) - Urine culture - nitrofurantoin (MACRODANTIN) 100 MG capsule; Take 1 capsule (100 mg total) by mouth 4 (four) times daily.  Dispense: 14 capsule; Refill: 0  2. Idiopathic insomnia May take up to 10 mg of Ambien nightly   Halina Maidens, MD [facility]  07/13/2016

## 2016-07-16 ENCOUNTER — Other Ambulatory Visit: Payer: Self-pay | Admitting: Internal Medicine

## 2016-07-17 LAB — URINE CULTURE

## 2016-07-23 DIAGNOSIS — M5136 Other intervertebral disc degeneration, lumbar region: Secondary | ICD-10-CM | POA: Diagnosis not present

## 2016-07-23 DIAGNOSIS — M791 Myalgia: Secondary | ICD-10-CM | POA: Diagnosis not present

## 2016-08-08 ENCOUNTER — Encounter: Payer: Self-pay | Admitting: Internal Medicine

## 2016-08-08 ENCOUNTER — Ambulatory Visit (INDEPENDENT_AMBULATORY_CARE_PROVIDER_SITE_OTHER): Payer: PPO | Admitting: Internal Medicine

## 2016-08-08 VITALS — BP 122/76 | HR 84 | Resp 16 | Ht 60.0 in | Wt 145.0 lb

## 2016-08-08 DIAGNOSIS — N390 Urinary tract infection, site not specified: Secondary | ICD-10-CM

## 2016-08-08 DIAGNOSIS — N3000 Acute cystitis without hematuria: Secondary | ICD-10-CM

## 2016-08-08 LAB — POC URINALYSIS WITH MICROSCOPIC (NON AUTO)MANUAL RESULT
Bilirubin, UA: NEGATIVE
CRYSTALS: 0
Epithelial cells, urine per micros: 0
GLUCOSE UA: NEGATIVE
Ketones, UA: NEGATIVE
MUCUS UA: 0
Nitrite, UA: POSITIVE
Protein, UA: NEGATIVE
RBC: 5 M/uL (ref 4.04–5.48)
Spec Grav, UA: 1.01
Urobilinogen, UA: 0.2
WBC CASTS UA: 50
pH, UA: 7

## 2016-08-08 MED ORDER — NITROFURANTOIN MACROCRYSTAL 100 MG PO CAPS: 100.0000 mg | ORAL_CAPSULE | Freq: Four times a day (QID) | ORAL | 0 refills | Status: DC

## 2016-08-08 NOTE — Progress Notes (Signed)
Date:  08/08/2016   Name:  Caroline Conway   DOB:  [birth year]   MRN:  [id number]   Chief Complaint: Urinary Frequency (Started Thursday ) Urinary Frequency   This is a recurrent problem. The current episode started in the past 7 days. The problem occurs every urination. The quality of the pain is described as burning. The patient is experiencing no pain. There has been no fever. Associated symptoms include flank pain, frequency, hesitancy and urgency. Pertinent negatives include no chills or hematuria. She has tried increased fluids for the symptoms.      Review of Systems  Constitutional: Negative for chills and fatigue.  Respiratory: Negative for chest tightness and shortness of breath.   Cardiovascular: Negative for chest pain.  Genitourinary: Positive for dysuria, flank pain, frequency, hesitancy and urgency. Negative for hematuria.  Musculoskeletal: Positive for back pain.    Patient Active Problem List   Diagnosis Date Noted  . Herpes simplex infection 07/07/2015  . Anxiety disorder 01/29/2015  . Atrophic vaginitis 01/29/2015  . Dyslipidemia 01/29/2015  . Essential (primary) hypertension 01/29/2015  . Acid reflux 01/29/2015  . Depression, major, single episode, in partial remission ([place]) 01/29/2015  . Idiopathic peripheral neuropathy 01/29/2015  . Idiopathic insomnia 01/29/2015  . Rheumatoid arthritis involving multiple joints ([place]) 01/29/2015  . Urge incontinence 01/29/2015  . Pelvic relaxation due to vaginal prolapse 01/29/2015    Prior to Admission medications   Medication Sig Start Date End Date Taking? Authorizing Provider  atenolol (TENORMIN) 50 MG tablet TAKE 1 TABLET BY MOUTH DAILY 07/16/16  Yes Glean Hess, MD  DULoxetine (CYMBALTA) 20 MG capsule Take by mouth. 06/22/16 06/22/17 Yes Historical Provider, MD  esomeprazole (NEXIUM) 20 MG capsule Take 1 capsule by mouth daily.   Yes Historical Provider, MD  folic acid (FOLVITE) 1 MG tablet Take 1 tablet by  mouth daily.   Yes Historical Provider, MD  hydrochlorothiazide (HYDRODIURIL) 25 MG tablet Take 2 tablets (50 mg total) by mouth daily. 05/01/16  Yes Glean Hess, MD  ibuprofen (ADVIL,MOTRIN) 600 MG tablet TAKE 1 TABLET (600 MG TOTAL) BY MOUTH EVERY 6 (SIX) HOURS AS NEEDED FOR PAIN. 04/05/15  Yes Historical Provider, MD  meloxicam (MOBIC) 15 MG tablet Take 1 tablet by mouth daily.   Yes Historical Provider, MD  methotrexate 2.5 MG tablet Take 8 tablets by mouth once a week.   Yes Historical Provider, MD  mirtazapine (REMERON) 30 MG tablet TAKE 1 TABLET BY MOUTH AT BEDTIME 04/23/16  Yes Glean Hess, MD  nitrofurantoin (MACRODANTIN) 100 MG capsule Take 1 capsule (100 mg total) by mouth 4 (four) times daily. 07/13/16  Yes Glean Hess, MD  PREMARIN vaginal cream INSERT 1 APPLICATORFUL VAGINALLY AT BEDTIME 12/13/15  Yes Glean Hess, MD  simvastatin (ZOCOR) 20 MG tablet Take 1 tablet (20 mg total) by mouth daily. 05/01/16  Yes Glean Hess, MD  Tofacitinib Citrate 11 MG TB24 Take by mouth.   Yes Historical Provider, MD  zolpidem (AMBIEN) 5 MG tablet Take 1 tablet (5 mg total) by mouth at bedtime. 05/01/16  Yes Glean Hess, MD    Allergies  Allergen Reactions  . Codeine Sulfate Hives  . Oxycodone Itching  . Propoxyphene Hives  . Penicillins Rash  . Sulfa Antibiotics Rash    Past Surgical History:  Procedure Laterality Date  . ANTERIOR AND POSTERIOR VAGINAL REPAIR  04/2015  . APPENDECTOMY  1960  . BREAST CYST ASPIRATION Left    lt  fna- neg  . [place] SURGERY  2011  . riight shoulder surgery Right 1999  . SHOULDER SURGERY Left 2006  . TOTAL KNEE ARTHROPLASTY Left 2011  . VAGINAL HYSTERECTOMY  04/2015    Social History  Substance Use Topics  . Smoking status: Never Smoker  . Smokeless tobacco: Never Used  . Alcohol use No     Medication list has been reviewed and updated.   Physical Exam  Constitutional: She is oriented to person, place, and time. She  appears well-developed. No distress.  HENT:  Head: Normocephalic and atraumatic.  Cardiovascular: Normal rate, regular rhythm and normal heart sounds.   Pulmonary/Chest: Effort normal and breath sounds normal. No respiratory distress.  Abdominal: There is tenderness in the suprapubic area. There is no rigidity, no guarding and no CVA tenderness.  Musculoskeletal: Normal range of motion.  Neurological: She is alert and oriented to person, place, and time.  Skin: Skin is warm and dry. No rash noted.  Psychiatric: She has a normal mood and affect. Her behavior is normal. Thought content normal.  Nursing note and vitals reviewed.   BP 122/76   Pulse 84   Resp 16   Ht 5' (1.524 m)   Wt 145 lb (65.8 kg)   SpO2 99%   BMI 28.32 kg/m   Assessment and Plan: 1. Acute cystitis without hematuria - POC urinalysis w microscopic (non auto) - nitrofurantoin (MACRODANTIN) 100 MG capsule; Take 1 capsule (100 mg total) by mouth 4 (four) times daily.  Dispense: 14 capsule; Refill: 0 - Urine Culture  2. Recurrent UTI (urinary tract infection) - Ambulatory referral to Urology   Halina Maidens, MD [facility]  08/08/2016

## 2016-08-10 LAB — URINE CULTURE

## 2016-08-16 ENCOUNTER — Other Ambulatory Visit: Payer: Self-pay | Admitting: *Deleted

## 2016-08-16 DIAGNOSIS — N390 Urinary tract infection, site not specified: Secondary | ICD-10-CM

## 2016-08-17 ENCOUNTER — Ambulatory Visit: Payer: PPO | Admitting: Urology

## 2016-08-17 ENCOUNTER — Other Ambulatory Visit
Admission: RE | Admit: 2016-08-17 | Discharge: 2016-08-17 | Disposition: A | Discharge status: Home / Self Care | Payer: PPO | Source: Ambulatory Visit | Attending: Urology | Admitting: Urology

## 2016-08-17 ENCOUNTER — Encounter: Payer: Self-pay | Admitting: Urology

## 2016-08-17 ENCOUNTER — Ambulatory Visit (INDEPENDENT_AMBULATORY_CARE_PROVIDER_SITE_OTHER): Payer: PPO | Admitting: Urology

## 2016-08-17 VITALS — BP 141/78 | HR 57 | Ht 60.0 in | Wt 148.0 lb

## 2016-08-17 DIAGNOSIS — R3129 Other microscopic hematuria: Secondary | ICD-10-CM | POA: Diagnosis not present

## 2016-08-17 DIAGNOSIS — N390 Urinary tract infection, site not specified: Secondary | ICD-10-CM | POA: Insufficient documentation

## 2016-08-17 DIAGNOSIS — R350 Frequency of micturition: Secondary | ICD-10-CM | POA: Diagnosis not present

## 2016-08-17 DIAGNOSIS — N3941 Urge incontinence: Secondary | ICD-10-CM | POA: Diagnosis not present

## 2016-08-17 LAB — URINALYSIS, COMPLETE (UACMP) WITH MICROSCOPIC
BACTERIA UA: NONE SEEN
Bilirubin Urine: NEGATIVE
Glucose, UA: NEGATIVE mg/dL
KETONES UR: NEGATIVE mg/dL
LEUKOCYTES UA: NEGATIVE
Nitrite: NEGATIVE
PROTEIN: NEGATIVE mg/dL
Specific Gravity, Urine: 1.015 (ref 1.005–1.030)
pH: 7.5 (ref 5.0–8.0)

## 2016-08-17 NOTE — Progress Notes (Signed)
08/17/2016 11:57 AM   [name] Jul 10, 1941 [id number]  Referring provider: Glean Hess, MD [address] [place], [place] [address]  Chief Complaint  Patient presents with  . New Patient (Initial Visit)    recurrent uti referred by     HPI: Patient is a 75 year old Caucasian female who presents today as a referral from Dr. Army Melia for recurrent UTI's.    Patient states that this has been on ongoing issue for several years.  Her symptoms with a urinary tract infection consist of incontinence.  She denies dysuria, gross hematuria, suprapubic pain, back pain, abdominal pain or flank pain.   She has not had any recent fevers, chills, nausea or vomiting.   She is having frequency (8 to 9 times daily), nocturia x 3-4 and incontinence.  She does not have a history of nephrolithiasis, GU surgery or GU trauma.   She was being followed by our office for hematuria and a lesion in her bladder.  She then transferred her care to [facility] urogynecology.  She was finding the drive too much and wants to reestablish her care with [place].    Reviewing her records,  she has had 3 documented positive cultures .    She is not sexually active.  She is post menopausal.  She is using her vaginal estrogen cream.    She admits to constipation.  She does engage in good perineal hygiene. She does not take tub baths.   She has incontinence.  She is using 6 incontinence pads daily.    She is not having pain with bladder filling.    She had a CT w wo for hematuria in 05/2016 which noted no radiographic evidence of urinary tract carcinoma, urolithiasis, or hydronephrosis. Mild diffuse wall thickening and mucosal enhancement involving the urinary bladder, suspicious for cystitis. Consider cystoscopy for further evaluation.  Aortic atherosclerosis.  I have independently reviewed the films.  Cystoscopy performed on 06/15/2016 by Dr. Derrill Kay was normal.   She is drinking 6 to 8 bottles of water  daily.    She is currently on Detrol LA 2 mg daily.  She could not tolerate Vesicare due to dry mouth.    PMH: Past Medical History:  Diagnosis Date  . Cancer ([place])    skin ca  . Collagen vascular disease (H[facility])    RA  . H/O total hysterectomy   . Heartburn   . HLD (hyperlipidemia)   . Hypertension   . Neuropathy ([place])   . Rheumatoid arteritis     Surgical History: Past Surgical History:  Procedure Laterality Date  . ANTERIOR AND POSTERIOR VAGINAL REPAIR  04/2015  . APPENDECTOMY  1960  . BREAST CYST ASPIRATION Left    lt fna- neg  . [place] SURGERY  2011  . riight shoulder surgery Right 1999  . SHOULDER SURGERY Left 2006  . TOTAL KNEE ARTHROPLASTY Left 2011  . VAGINAL HYSTERECTOMY  04/2015    Home Medications:    Medication List       Accurate as of 08/17/16 11:57 AM. Always use your most recent med list.          atenolol 50 MG tablet Commonly known as:  TENORMIN TAKE 1 TABLET BY MOUTH DAILY   DULoxetine 20 MG capsule Commonly known as:  CYMBALTA Take by mouth.   esomeprazole 20 MG capsule Commonly known as:  NEXIUM Take 1 capsule by mouth daily.   folic acid 1 MG tablet Commonly known  as:  FOLVITE Take 1 tablet by mouth daily.   hydrochlorothiazide 25 MG tablet Commonly known as:  HYDRODIURIL Take 2 tablets (50 mg total) by mouth daily.   ibuprofen 600 MG tablet Commonly known as:  ADVIL,MOTRIN TAKE 1 TABLET (600 MG TOTAL) BY MOUTH EVERY 6 (SIX) HOURS AS NEEDED FOR PAIN.   meloxicam 15 MG tablet Commonly known as:  MOBIC Take 1 tablet by mouth daily.   methotrexate 2.5 MG tablet Take 8 tablets by mouth once a week.   mirtazapine 30 MG tablet Commonly known as:  REMERON TAKE 1 TABLET BY MOUTH AT BEDTIME   nitrofurantoin 100 MG capsule Commonly known as:  MACRODANTIN Take 1 capsule (100 mg total) by mouth 4 (four) times daily.   PREMARIN vaginal cream Generic drug:  conjugated estrogens INSERT 1 APPLICATORFUL VAGINALLY AT BEDTIME     simvastatin 20 MG tablet Commonly known as:  ZOCOR Take 1 tablet (20 mg total) by mouth daily.   Tofacitinib Citrate 11 MG Tb24 Take by mouth.   zolpidem 5 MG tablet Commonly known as:  AMBIEN Take 1 tablet (5 mg total) by mouth at bedtime.       Allergies:  Allergies  Allergen Reactions  . Codeine Sulfate Hives  . Oxycodone Itching  . Propoxyphene Hives  . Penicillins Rash  . Sulfa Antibiotics Rash    Family History: Family History  Problem Relation Age of Onset  . Alzheimer's disease Mother   . Cancer Father   . Prostate cancer Neg Hx   . Kidney cancer Neg Hx   . Bladder Cancer Neg Hx     Social History:  reports that she has never smoked. She has never used smokeless tobacco. She reports that she does not drink alcohol or use drugs.  ROS: UROLOGY Frequent Urination?: Yes Hard to postpone urination?: No Burning/pain with urination?: No Get up at night to urinate?: Yes Leakage of urine?: Yes Urine stream starts and stops?: No Trouble starting stream?: No Do you have to strain to urinate?: No Blood in urine?: No Urinary tract infection?: Yes Sexually transmitted disease?: No Injury to kidneys or bladder?: No Painful intercourse?: No Weak stream?: No Currently pregnant?: No Vaginal bleeding?: No Last menstrual period?: n  Gastrointestinal Nausea?: No Vomiting?: No Indigestion/heartburn?: No Diarrhea?: No Constipation?: Yes  Constitutional Fever: No Night sweats?: No Weight loss?: No Fatigue?: No  Skin Skin rash/lesions?: No Itching?: No  Eyes Blurred vision?: No Double vision?: No  Ears/Nose/Throat Sore throat?: No Sinus problems?: No  Hematologic/Lymphatic Swollen glands?: No Easy bruising?: Yes  Cardiovascular Leg swelling?: No Chest pain?: No  Respiratory Cough?: No Shortness of breath?: No  Endocrine Excessive thirst?: No  Musculoskeletal Back pain?: Yes Joint pain?: No  Neurological Headaches?: No Dizziness?:  No  Psychologic Depression?: No Anxiety?: No  Physical Exam: BP (!) 141/78   Pulse (!) 57   Ht 5' (1.524 m)   Wt 148 lb (67.1 kg)   BMI 28.90 kg/m   Constitutional: Well nourished. Alert and oriented, No acute distress. HEENT: [place] AT, moist mucus membranes. Trachea midline, no masses. Cardiovascular: No clubbing, cyanosis, or edema. Respiratory: Normal respiratory effort, no increased work of breathing. GI: Abdomen is soft, non tender, non distended, no abdominal masses. Liver and spleen not palpable.  No hernias appreciated.  Stool sample for occult testing is not indicated.   GU: No CVA tenderness.  No bladder fullness or masses.   Skin: No rashes, bruises or suspicious lesions. Lymph: No cervical or inguinal  adenopathy. Neurologic: Grossly intact, no focal deficits, moving all 4 extremities. Psychiatric: Normal mood and affect.  Laboratory Data: Lab Results  Component Value Date   WBC 5.2 04/13/2010   HGB 12.5 07/15/2014   HCT 37 07/15/2014   MCV 92.7 04/13/2010   PLT 299 04/13/2010    Lab Results  Component Value Date   CREATININE 0.81 05/31/2016     Lab Results  Component Value Date   TSH 1.17 07/15/2014       Component Value Date/Time   CHOL 185 07/15/2014   HDL 72 (A) 07/15/2014   LDLCALC 92 07/15/2014      Urinalysis 6-30 RBC's.  See EPIC.  Pertinent Imaging: CLINICAL DATA:  Gross and microscopic hematuria. Urinary urgency for several days.  EXAM: CT ABDOMEN AND PELVIS WITHOUT AND WITH CONTRAST  TECHNIQUE: Multidetector CT imaging of the abdomen and pelvis was performed following the standard protocol before and following the bolus administration of intravenous contrast.  CONTRAST:  176mL ISOVUE-300 IOPAMIDOL (ISOVUE-300) INJECTION 61%  COMPARISON:  05/10/2014  FINDINGS: Lower Chest: No acute findings.  Hepatobiliary: No mass identified. Probable tiny sub-cm hepatic cysts are too small to characterize but remain stable.  Gallbladder is unremarkable.  Pancreas:  No mass or inflammatory changes.  Spleen:  Unremarkable.  Adrenals/Urinary Tract: No adrenal masses identified. No evidence of urolithiasis or hydronephrosis. No complex cystic or solid renal masses are identified. No masses seen involving the ureters or bladder. The urinary bladder shows mild diffuse wall thickening and mucosal enhancement, suspicious for cystitis.  Stomach/Bowel: No evidence of obstruction, inflammatory process or abnormal fluid collections. Moderate colonic stool burden appears similar to previous study.  Vascular/Lymphatic: No pathologically enlarged lymph nodes. No abdominal aortic aneurysm. Aortic atherosclerosis.  Reproductive: Prior hysterectomy noted. Adnexal regions are unremarkable in appearance.  Other:  None.  Musculoskeletal: No suspicious bone lesions identified. PLIF hardware again seen at L4-5. Moderate degenerative disc disease and facet DJD seen at L5-S1 with grade 1 anterolisthesis.  IMPRESSION: No radiographic evidence of urinary tract carcinoma, urolithiasis, or hydronephrosis.  Mild diffuse wall thickening and mucosal enhancement involving the urinary bladder, suspicious for cystitis. Consider cystoscopy for further evaluation.  Aortic atherosclerosis.   Electronically Signed   By: Earle Gell M.D.   On: 05/31/2016 15:18   Assessment & Plan:    1. Incontinence  - patient has been evaluated through [facility] urogynecology and tried on anticholinergics  - she had not been tried on Myrbetriq as this was the next step before she came to reestablish care with [place]  - I have given her Myrbetriq 50 mg, # 28 samples and I have advised the patient of the side effects of Myrbetriq, such as: elevation in BP, urinary retention and/or HA.  - If Myrbetriq is ineffective in controlling her incontinence, she would like an appointment with Dr. Matilde Sprang as she has a friend who is currently being  managed by him with good results  2. Frequency  - discontinue the Detrol LA 2 mg daily  - see above  3. Microscopic hematuria  - patient completed work up this year (2017) no worrisome GU malignancies were found  Return in about 3 weeks (around 09/07/2016) for patient to call in three weeks.  These notes generated with voice recognition software. I apologize for typographical errors.  Zara Council, [facility] [address] [place], [place] [address] [phone]

## 2016-08-22 ENCOUNTER — Ambulatory Visit: Payer: Self-pay

## 2016-08-22 ENCOUNTER — Telehealth: Payer: Self-pay | Admitting: Urology

## 2016-08-22 DIAGNOSIS — N390 Urinary tract infection, site not specified: Secondary | ICD-10-CM

## 2016-08-22 NOTE — Telephone Encounter (Signed)
Pt saw Larene Beach last week in [place].  She thinks she has a bladder infection and wants to know what she should do.  Please call pt.

## 2016-08-22 NOTE — Telephone Encounter (Signed)
Spoke with pt in reference to uti like symptoms. Pt was added to nurse schedule for a cath specimen.

## 2016-08-23 ENCOUNTER — Ambulatory Visit (INDEPENDENT_AMBULATORY_CARE_PROVIDER_SITE_OTHER): Payer: PPO

## 2016-08-23 VITALS — BP 145/74 | HR 63 | Ht 60.0 in | Wt 146.8 lb

## 2016-08-23 DIAGNOSIS — N390 Urinary tract infection, site not specified: Secondary | ICD-10-CM

## 2016-08-23 LAB — MICROSCOPIC EXAMINATION
Epithelial Cells (non renal): NONE SEEN /hpf (ref 0–10)
RBC MICROSCOPIC, UA: NONE SEEN /HPF (ref 0–?)
WBC, UA: >30 /hpf — AB (ref 0–?)

## 2016-08-23 LAB — URINALYSIS, COMPLETE
Bilirubin, UA: NEGATIVE
GLUCOSE, UA: NEGATIVE
KETONES UA: NEGATIVE
NITRITE UA: POSITIVE — AB
SPEC GRAV UA: 1.015 (ref 1.005–1.030)
Urobilinogen, Ur: 0.2 mg/dL (ref 0.2–1.0)
pH, UA: 7.5 (ref 5.0–7.5)

## 2016-08-23 NOTE — Progress Notes (Signed)
Pt presented today with c/o urinary frequency, urgency, and leakage of urine. Pt has been on abx in the past 30 days for a previous UTI. A CATH specimen was obtained for a u/a and cx.   Blood pressure (!) 145/74, pulse 63, height 5' (1.524 m), weight 146 lb 12.8 oz (66.6 kg).

## 2016-08-23 NOTE — Addendum Note (Signed)
Addended by: Jodie Echevaria on: 08/23/2016 12:28 PM   Modules accepted: Orders

## 2016-08-26 ENCOUNTER — Other Ambulatory Visit: Payer: Self-pay | Admitting: Urology

## 2016-08-26 LAB — CULTURE, URINE COMPREHENSIVE

## 2016-08-26 MED ORDER — CIPROFLOXACIN HCL 250 MG PO TABS: 250.0000 mg | ORAL_TABLET | Freq: Two times a day (BID) | ORAL | 0 refills | Status: DC

## 2016-08-27 ENCOUNTER — Telehealth: Payer: Self-pay | Admitting: *Deleted

## 2016-08-27 ENCOUNTER — Other Ambulatory Visit: Payer: Self-pay | Admitting: Urology

## 2016-08-27 MED ORDER — NITROFURANTOIN MONOHYD MACRO 100 MG PO CAPS: 100.0000 mg | ORAL_CAPSULE | Freq: Two times a day (BID) | ORAL | 0 refills | Status: DC

## 2016-08-27 NOTE — Telephone Encounter (Signed)
-----   Message from Nori Riis, PA-C sent at 08/26/2016  5:10 PM EST ----- Patient has a +UCx.  They need to start Cipro 250 mg one tablet twice daily for seven days.  They also need to take a probiotic with the antibiotic course.  The dosage is listed below:  L. acidophilus and L. casei (25 x 109 CFU/day for 2 days, then 50 x 109 CFU/day for duration of the antibiotic course)  I have e scribed their prescription to [facility]

## 2016-08-27 NOTE — Telephone Encounter (Signed)
LMOM for patient to call back about recent labs.

## 2016-08-27 NOTE — Telephone Encounter (Signed)
Patient notified of results, she states that Cipro does not work well for her "it isn't strong enough" she said that she would like macrobid sent to the pharm in place of the Cipro.  Zara Council, PAC was notified of this and a new script was sent to the patient's pharm, patient was notified/SW

## 2016-09-11 ENCOUNTER — Other Ambulatory Visit: Payer: Self-pay | Admitting: Urology

## 2016-09-11 ENCOUNTER — Telehealth: Payer: Self-pay | Admitting: Urology

## 2016-09-11 MED ORDER — MIRABEGRON ER 50 MG PO TB24: 50.0000 mg | ORAL_TABLET | Freq: Every day | ORAL | 12 refills | Status: DC

## 2016-09-11 NOTE — Telephone Encounter (Signed)
Prescriptions sent for Myrbetriq 50 mg to [facility] in [place].

## 2016-09-11 NOTE — Telephone Encounter (Signed)
Patient would like a script for mybretriq she said it is working for her  [facility]

## 2016-09-19 ENCOUNTER — Telehealth: Payer: Self-pay | Admitting: Urology

## 2016-09-19 NOTE — Telephone Encounter (Signed)
Pt just finished prescription and feels like she has another bladder infection.  She would like to get an appt, but wasn't sure if she could just give urine sample.  Please advise.  [phone]

## 2016-09-19 NOTE — Telephone Encounter (Signed)
She can present to the office for a CATH UA.

## 2016-09-19 NOTE — Telephone Encounter (Signed)
Spoke with patient and gave her Shannon's message. Patient transferred to Leodis Sias to make an appointment tomorrow on the Nurse schedule for a cath urine. Patient ok with plan.

## 2016-09-20 ENCOUNTER — Ambulatory Visit (INDEPENDENT_AMBULATORY_CARE_PROVIDER_SITE_OTHER): Payer: PPO

## 2016-09-20 VITALS — BP 137/78 | HR 66 | Temp 98.2°F | Ht 60.0 in | Wt 151.1 lb

## 2016-09-20 DIAGNOSIS — N390 Urinary tract infection, site not specified: Secondary | ICD-10-CM | POA: Diagnosis not present

## 2016-09-20 LAB — URINALYSIS, COMPLETE
BILIRUBIN UA: NEGATIVE
Glucose, UA: NEGATIVE
Nitrite, UA: POSITIVE — AB
SPEC GRAV UA: 1.02 (ref 1.005–1.030)
UUROB: 0.2 mg/dL (ref 0.2–1.0)
pH, UA: 6.5 (ref 5.0–7.5)

## 2016-09-20 LAB — MICROSCOPIC EXAMINATION
EPITHELIAL CELLS (NON RENAL): NONE SEEN /HPF (ref 0–10)
RBC, UA: NONE SEEN /hpf (ref 0–?)

## 2016-09-20 NOTE — Progress Notes (Signed)
Pt presents today with c/o urinary frequency and urgency, hard to postpone urination, chills, leakage of urine, and lower abd pain. Pt completed abx 1 week ago due to UTI. A CATH specimen was obtained for u/a and cx.   Blood pressure 137/78, pulse 66, temperature 98.2 F (36.8 C), height 5' (1.524 m), weight 151 lb 1.6 oz (68.5 kg).

## 2016-09-23 LAB — CULTURE, URINE COMPREHENSIVE

## 2016-09-24 ENCOUNTER — Telehealth: Payer: Self-pay | Admitting: Family Medicine

## 2016-09-24 MED ORDER — NITROFURANTOIN MONOHYD MACRO 100 MG PO CAPS: 100.0000 mg | ORAL_CAPSULE | Freq: Two times a day (BID) | ORAL | 0 refills | Status: DC

## 2016-09-24 NOTE — Telephone Encounter (Signed)
Patient notified and will pick up probiotic and start ABX.

## 2016-09-24 NOTE — Telephone Encounter (Signed)
-----   Message from Nori Riis, PA-C sent at 09/23/2016  6:09 PM EST ----- Patient has a +UCx.  They need to start Macrobid one capsule twice daily for seven days.  They also need to take a probiotic with the antibiotic course.  The dosage is listed below:  L. acidophilus and L. casei (25 x 109 CFU/day for 2 days, then 50 x 109 CFU/day for duration of the antibiotic course)

## 2016-09-25 DIAGNOSIS — D485 Neoplasm of uncertain behavior of skin: Secondary | ICD-10-CM | POA: Diagnosis not present

## 2016-09-25 DIAGNOSIS — D229 Melanocytic nevi, unspecified: Secondary | ICD-10-CM | POA: Diagnosis not present

## 2016-09-25 DIAGNOSIS — K130 Diseases of lips: Secondary | ICD-10-CM | POA: Diagnosis not present

## 2016-09-25 DIAGNOSIS — D225 Melanocytic nevi of trunk: Secondary | ICD-10-CM | POA: Diagnosis not present

## 2016-09-28 ENCOUNTER — Other Ambulatory Visit: Payer: Self-pay | Admitting: Internal Medicine

## 2016-09-28 DIAGNOSIS — Z1231 Encounter for screening mammogram for malignant neoplasm of breast: Secondary | ICD-10-CM

## 2016-10-01 ENCOUNTER — Other Ambulatory Visit: Payer: Self-pay | Admitting: Internal Medicine

## 2016-10-03 ENCOUNTER — Ambulatory Visit: Payer: PPO | Admitting: Urology

## 2016-10-03 VITALS — BP 132/86 | HR 54 | Ht 60.0 in | Wt 150.7 lb

## 2016-10-03 DIAGNOSIS — N3020 Other chronic cystitis without hematuria: Secondary | ICD-10-CM

## 2016-10-03 MED ORDER — TRIMETHOPRIM 100 MG PO TABS: 100.0000 mg | ORAL_TABLET | Freq: Every day | ORAL | 11 refills | Status: DC

## 2016-10-03 NOTE — Addendum Note (Signed)
Addended by: Wilson Singer on: 10/03/2016 02:20 PM   Modules accepted: Orders

## 2016-10-03 NOTE — Progress Notes (Signed)
10/03/2016 12:21 PM   Caroline Conway November 03, 1940 [id number]  Referring provider: Glean Hess, MD [address] [place], [place] [address]  Chief Complaint  Patient presents with  . Recurrent UTI    HPI: I was consulted to assess the patient is a second opinion for the patient's recurrent urinary tract infections. She has been worked up and treated at [facility] in the apartment urogynecology based upon her history  She believes she gets approximately 6 or more bladder infections a year. Within a few days of stopping antibiotics she gets another infection. She has foul-smelling and cloudy urine with increased urgency that responds favorably to antibiotics. I think she's been on prophylaxis in the past. She currently is not on a prophylactic medication. She's had a negative cystoscopy and a CT scan in September 2017 was within normal limits  When she is not infected she has mixed stress urge incontinence. The urge component is more significant. He might have rare bedwetting but does report. On the floor syndrome. She wears 6 pads per day moderately wet to soaked. Botox has been mentioned to her in the past. She has failed oxybutynin Vesicare based upon efficacy and dry mouth  She is prone to constipation. She has had lower back surgery.  Modifying factors: There are no other modifying factors  Associated signs and symptoms: There are no other associated signs and symptoms Aggravating and relieving factors: There are no other aggravating or relieving factors Severity: Moderate Duration: Persistent    PMH: Past Medical History:  Diagnosis Date  . Cancer ([place])    skin ca  . Collagen vascular disease (HCC)    RA  . H/O total hysterectomy   . Heartburn   . HLD (hyperlipidemia)   . Hypertension   . Neuropathy ([place])   . Rheumatoid arteritis     Surgical History: Past Surgical History:  Procedure Laterality Date  . ANTERIOR AND POSTERIOR VAGINAL REPAIR   04/2015  . APPENDECTOMY  1960  . BREAST CYST ASPIRATION Left    lt fna- neg  . [place] SURGERY  2011  . riight shoulder surgery Right 1999  . SHOULDER SURGERY Left 2006  . TOTAL KNEE ARTHROPLASTY Left 2011  . VAGINAL HYSTERECTOMY  04/2015    Home Medications:  Allergies as of 10/03/2016      Reactions   Codeine Sulfate Hives   Oxycodone Itching   Propoxyphene Hives   Penicillins Rash   Sulfa Antibiotics Rash      Medication List       Accurate as of 10/03/16 12:21 PM. Always use your most recent med list.          atenolol 50 MG tablet Commonly known as:  TENORMIN TAKE 1 TABLET BY MOUTH DAILY   DULoxetine 20 MG capsule Commonly known as:  CYMBALTA Take by mouth.   esomeprazole 20 MG capsule Commonly known as:  NEXIUM Take 1 capsule by mouth daily.   folic acid 1 MG tablet Commonly known as:  FOLVITE Take 1 tablet by mouth daily.   hydrochlorothiazide 25 MG tablet Commonly known as:  HYDRODIURIL Take 2 tablets (50 mg total) by mouth daily.   ibuprofen 600 MG tablet Commonly known as:  ADVIL,MOTRIN TAKE 1 TABLET (600 MG TOTAL) BY MOUTH EVERY 6 (SIX) HOURS AS NEEDED FOR PAIN.   meloxicam 15 MG tablet Commonly known as:  MOBIC Take 1 tablet by mouth daily.   methotrexate 2.5 MG tablet Take 8 tablets by mouth  once a week.   mirabegron ER 50 MG Tb24 tablet Commonly known as:  MYRBETRIQ Take 1 tablet (50 mg total) by mouth daily.   mirtazapine 30 MG tablet Commonly known as:  REMERON TAKE 1 TABLET BY MOUTH AT BEDTIME   nitrofurantoin 100 MG capsule Commonly known as:  MACRODANTIN Take 1 capsule (100 mg total) by mouth 4 (four) times daily.   PREMARIN vaginal cream Generic drug:  conjugated estrogens INSERT 1 APPLICATORFUL VAGINALLY AT BEDTIME   simvastatin 20 MG tablet Commonly known as:  ZOCOR Take 1 tablet (20 mg total) by mouth daily.   Tofacitinib Citrate 11 MG Tb24 Take by mouth.   zolpidem 5 MG tablet Commonly known as:  AMBIEN Take  1 tablet (5 mg total) by mouth at bedtime.       Allergies:  Allergies  Allergen Reactions  . Codeine Sulfate Hives  . Oxycodone Itching  . Propoxyphene Hives  . Penicillins Rash  . Sulfa Antibiotics Rash    Family History: Family History  Problem Relation Age of Onset  . Alzheimer's disease Mother   . Cancer Father   . Prostate cancer Neg Hx   . Kidney cancer Neg Hx   . Bladder Cancer Neg Hx     Social History:  reports that she has never smoked. She has never used smokeless tobacco. She reports that she does not drink alcohol or use drugs.  ROS:                                        Physical Exam: BP 132/86   Pulse (!) 54   Ht 5' (1.524 m)   Wt 150 lb 11.2 oz (68.4 kg)   BMI 29.43 kg/m   Constitutional:  Alert and oriented, No acute distress. HEENT: [place] AT, moist mucus membranes.  Trachea midline, no masses. Cardiovascular: No clubbing, cyanosis, or edema. Respiratory: Normal respiratory effort, no increased work of breathing. GI: Abdomen is soft, nontender, nondistended, no abdominal masses GU: No CVA tenderness. Mild vaginal atrophy and no prolapse and no SUI Skin: No rashes, bruises or suspicious lesions. Lymph: No cervical or inguinal adenopathy. Neurologic: Grossly intact, no focal deficits, moving all 4 extremities. Psychiatric: Normal mood and affect.  Laboratory Data: Lab Results  Component Value Date   WBC 5.2 04/13/2010   HGB 12.5 07/15/2014   HCT 37 07/15/2014   MCV 92.7 04/13/2010   PLT 299 04/13/2010    Lab Results  Component Value Date   CREATININE 0.81 05/31/2016    No results found for: PSA  No results found for: TESTOSTERONE  No results found for: HGBA1C  Urinalysis    Component Value Date/Time   COLORURINE YELLOW 08/17/2016 1104   APPEARANCEUR Cloudy (A) 09/20/2016 1211   LABSPEC 1.015 08/17/2016 1104   LABSPEC 1.015 03/01/2013 1325   PHURINE 7.5 08/17/2016 1104   GLUCOSEU Negative 09/20/2016 1211    GLUCOSEU NEGATIVE 03/01/2013 1325   HGBUR MODERATE (A) 08/17/2016 1104   BILIRUBINUR Negative 09/20/2016 1211   BILIRUBINUR NEGATIVE 03/01/2013 1325   KETONESUR NEGATIVE 08/17/2016 1104   PROTEINUR 2+ (A) 09/20/2016 1211   PROTEINUR NEGATIVE 08/17/2016 1104   UROBILINOGEN 0.2 08/08/2016 1457   NITRITE Positive (A) 09/20/2016 1211   NITRITE NEGATIVE 08/17/2016 1104   LEUKOCYTESUR 2+ (A) 09/20/2016 1211   LEUKOCYTESUR 3+ 03/01/2013 1325    Pertinent Imaging: See above  Assessment & Plan:  The patient has mixed incontinence and mild frequency and nocturia. She has chronic cystitis. I think prophylaxis will be a good treatment option for her. It'll be interesting see if some of her incontinence down regulates. She thinks the beta 3 agonist does help. I will repeat baseline her voiding dysfunction and goals in the future. Pathophysiology of chronic cystitis discussed. The patient will be started on trimethoprim 100 mg of 30 tablets and 11 refills and I will reassess her in 6 weeks  1. Mixed incontinence 2. Chronic cystitis  There are no diagnoses linked to this encounter.  No Follow-up on file.  Reece Packer, MD  [facility] [address] [place], [place] [address] [phone]

## 2016-10-05 ENCOUNTER — Telehealth: Payer: Self-pay

## 2016-10-05 DIAGNOSIS — N390 Urinary tract infection, site not specified: Secondary | ICD-10-CM

## 2016-10-05 LAB — CULTURE, URINE COMPREHENSIVE

## 2016-10-05 MED ORDER — CEPHALEXIN 500 MG PO CAPS: 500.0000 mg | ORAL_CAPSULE | Freq: Three times a day (TID) | ORAL | 0 refills | Status: DC

## 2016-10-05 NOTE — Telephone Encounter (Signed)
Bjorn Loser, MD  Lestine Box, LPN         Keflex [id number] mg tid for 7 days; then restart daily triprim (checak allergies thanks)    Spoke with pt in reference to keflex and triprim. Pt stated she will take keflex and benadryl. Pt voiced understanding.

## 2016-10-08 DIAGNOSIS — M2041 Other hammer toe(s) (acquired), right foot: Secondary | ICD-10-CM | POA: Diagnosis not present

## 2016-10-08 DIAGNOSIS — M2011 Hallux valgus (acquired), right foot: Secondary | ICD-10-CM | POA: Diagnosis not present

## 2016-10-15 ENCOUNTER — Ambulatory Visit
Admission: RE | Admit: 2016-10-15 | Discharge: 2016-10-15 | Disposition: A | Discharge status: Home / Self Care | Payer: PPO | Source: Ambulatory Visit | Attending: Internal Medicine | Admitting: Internal Medicine

## 2016-10-15 DIAGNOSIS — Z1231 Encounter for screening mammogram for malignant neoplasm of breast: Secondary | ICD-10-CM | POA: Insufficient documentation

## 2016-10-15 DIAGNOSIS — R928 Other abnormal and inconclusive findings on diagnostic imaging of breast: Secondary | ICD-10-CM | POA: Diagnosis not present

## 2016-10-17 ENCOUNTER — Other Ambulatory Visit: Payer: Self-pay | Admitting: Internal Medicine

## 2016-10-17 DIAGNOSIS — N6320 Unspecified lump in the left breast, unspecified quadrant: Secondary | ICD-10-CM

## 2016-10-17 DIAGNOSIS — R928 Other abnormal and inconclusive findings on diagnostic imaging of breast: Secondary | ICD-10-CM

## 2016-10-25 ENCOUNTER — Ambulatory Visit
Admission: RE | Admit: 2016-10-25 | Discharge: 2016-10-25 | Disposition: A | Discharge status: Home / Self Care | Payer: PPO | Source: Ambulatory Visit | Attending: Internal Medicine | Admitting: Internal Medicine

## 2016-10-25 DIAGNOSIS — N6320 Unspecified lump in the left breast, unspecified quadrant: Secondary | ICD-10-CM | POA: Diagnosis not present

## 2016-10-25 DIAGNOSIS — R928 Other abnormal and inconclusive findings on diagnostic imaging of breast: Secondary | ICD-10-CM

## 2016-10-25 DIAGNOSIS — R922 Inconclusive mammogram: Secondary | ICD-10-CM | POA: Diagnosis not present

## 2016-10-29 IMAGING — CT CT ABD-PEL WO/W CM
3 of 10 series · 13 of 46 positions shown, 19 images · IV contrast (iopamidol)
Comparison: 05/10/2014

CLINICAL DATA: Gross and microscopic hematuria. Urinary urgency for
several days.

EXAM:
CT ABDOMEN AND PELVIS WITHOUT AND WITH CONTRAST
TECHNIQUE: Multidetector CT imaging of the abdomen and pelvis was performed
following the standard protocol before and following the bolus
administration of intravenous contrast.
CONTRAST:  125mL [id number] IOPAMIDOL ([id number]-MEE) INJECTION 61%

[Series 5: hematuria < 45 with 100s · axial · 0.64mm/px · z∈[-867,-552]mm · 8 of 83 slices shown, 13 images]
[im 10/83  soft-tissue]
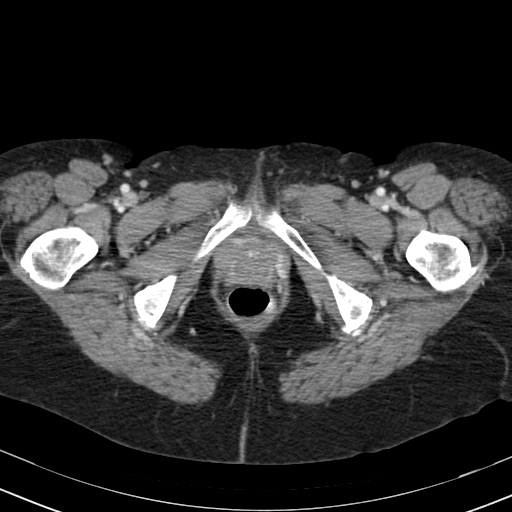
[im 10/83  bone]
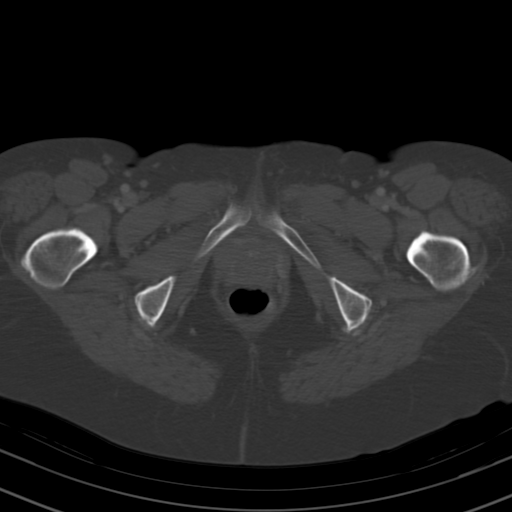
[im 19/83  soft-tissue]
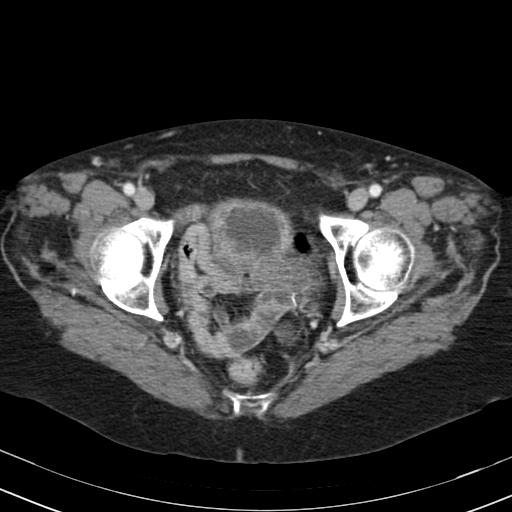
[im 28/83  soft-tissue]
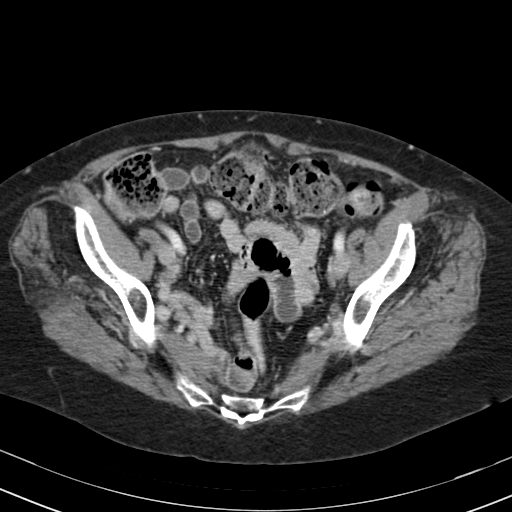
[im 37/83  soft-tissue]
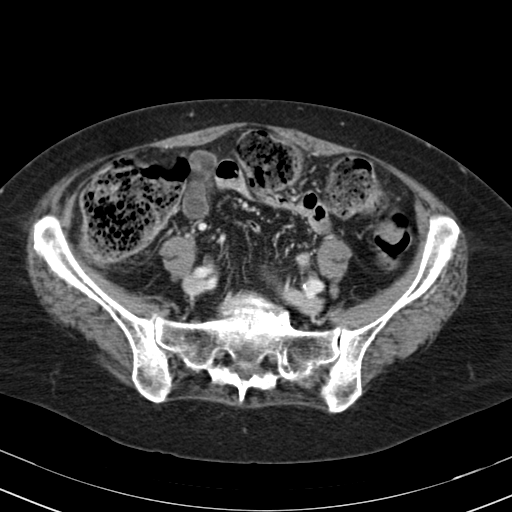
[im 46/83  soft-tissue]
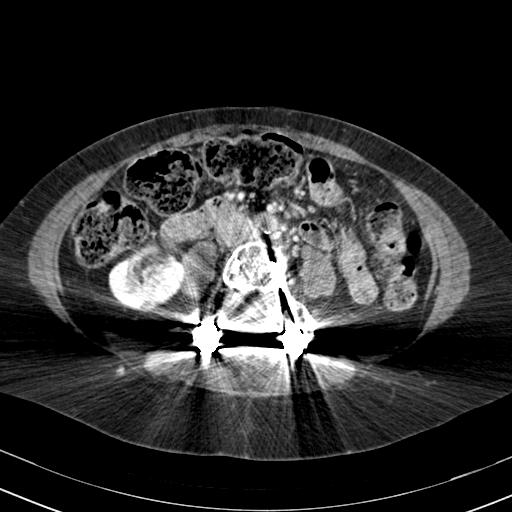
[im 46/83  lung]
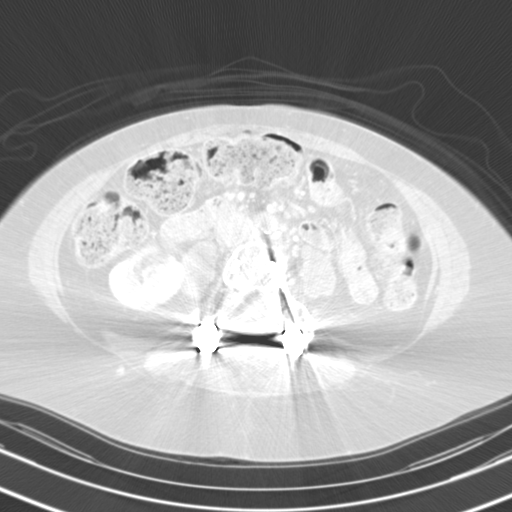
[im 55/83  soft-tissue]
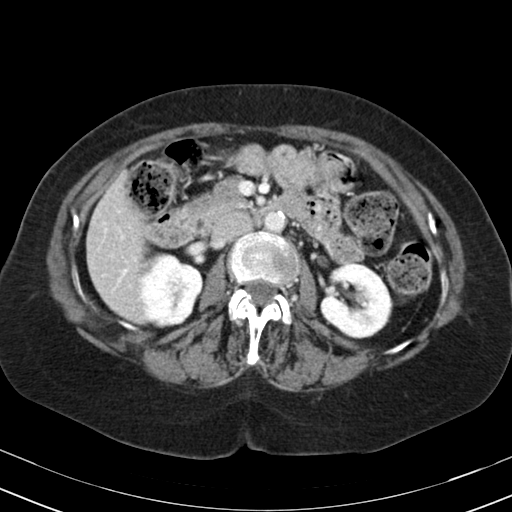
[im 55/83  lung]
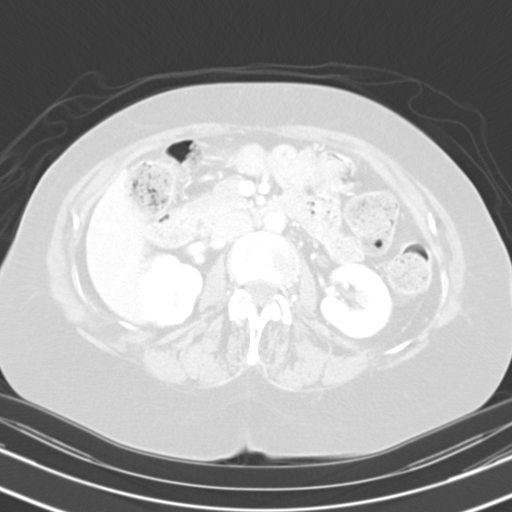
[im 64/83  soft-tissue]
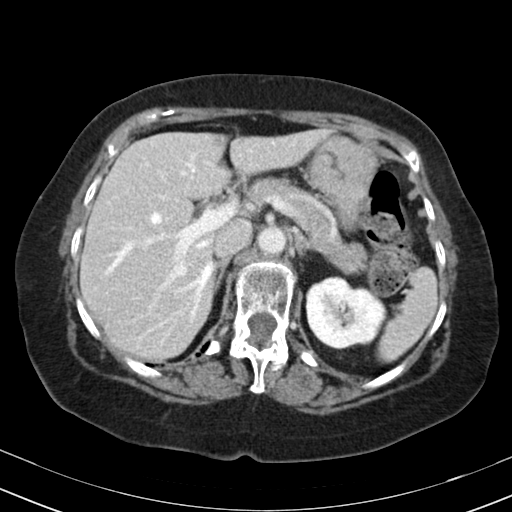
[im 64/83  lung]
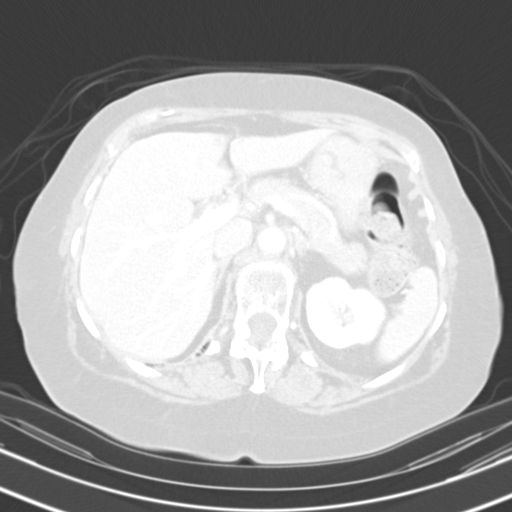
[im 73/83  soft-tissue]
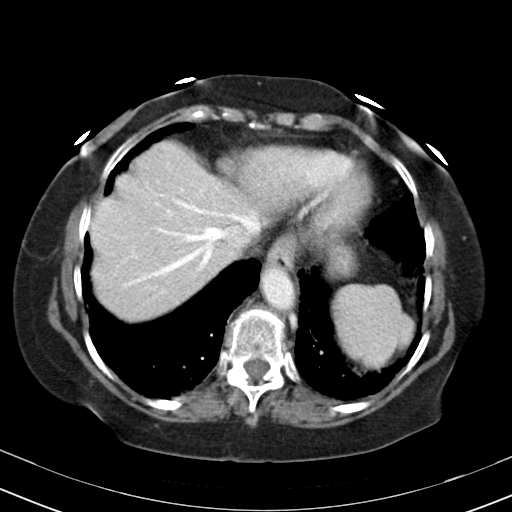
[im 73/83  lung]
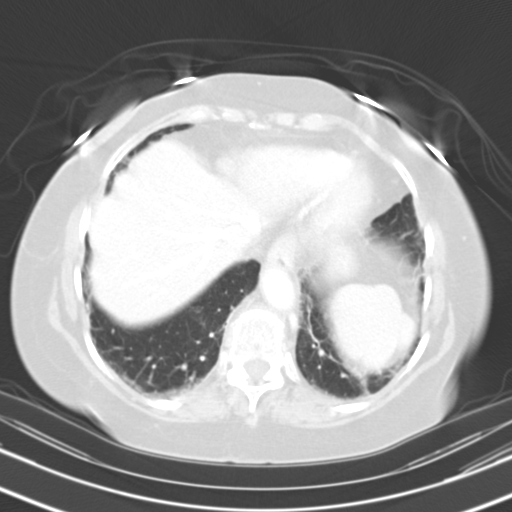

[Series 8: hematuria > 45 10 min delay · axial · delayed · 0.67mm/px · z∈[-911,-821]mm · 3 of 82 slices shown]
[im 10/82  soft-tissue]
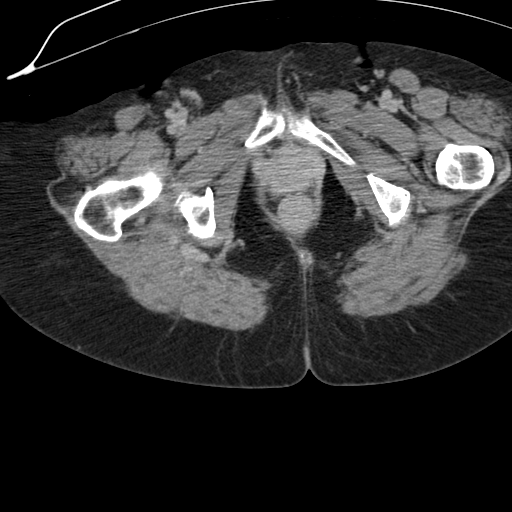
[im 19/82  soft-tissue]
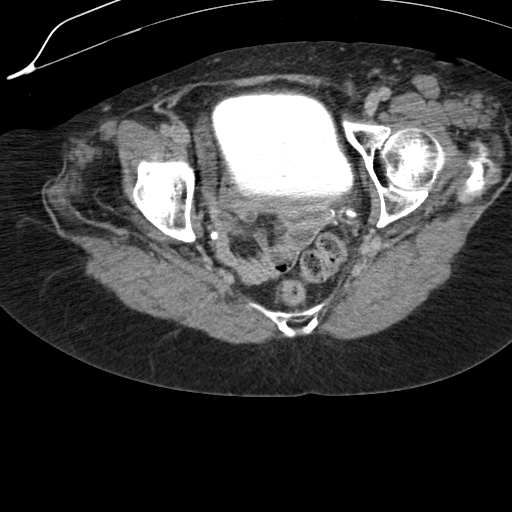
[im 28/82  soft-tissue]
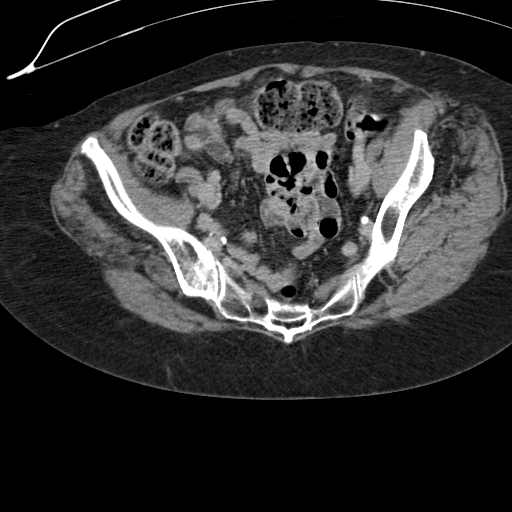

[Series 603: coronal wo · coronal · 0.80mm/px · 2 of 107 slices shown, 3 images]
[im 36/107  soft-tissue]
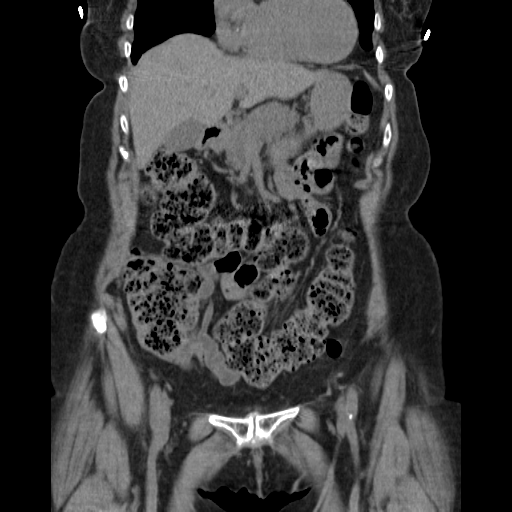
[im 36/107  bone]
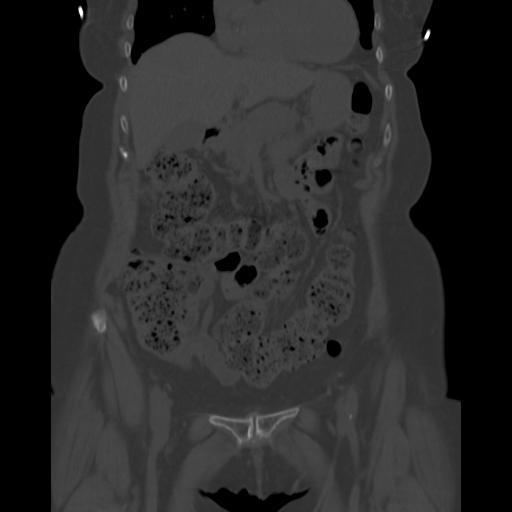
[im 71/107  soft-tissue]
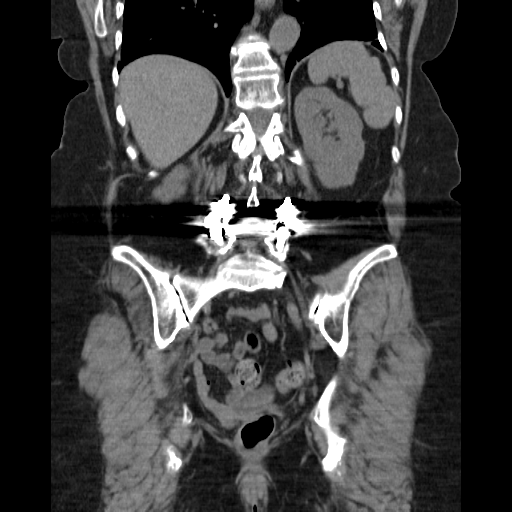

[13 of 46 positions shown; findings below may reference images not displayed]

FINDINGS: Lower Chest: No acute findings.

Hepatobiliary: No mass identified. Probable tiny sub-cm hepatic
cysts are too small to characterize but remain stable. Gallbladder
is unremarkable.

Pancreas:  No mass or inflammatory changes.

Spleen:  Unremarkable.

Adrenals/Urinary Tract: No adrenal masses identified. No evidence of
urolithiasis or hydronephrosis. No complex cystic or solid renal
masses are identified. No masses seen involving the ureters or
bladder. The urinary bladder shows mild diffuse wall thickening and
mucosal enhancement, suspicious for cystitis.

Stomach/Bowel: No evidence of obstruction, inflammatory process or
abnormal fluid collections. Moderate colonic stool burden appears
similar to previous study.

Vascular/Lymphatic: No pathologically enlarged lymph nodes. No
abdominal aortic aneurysm. Aortic atherosclerosis.

Reproductive: Prior hysterectomy noted. Adnexal regions are
unremarkable in appearance.

Other:  None.

Musculoskeletal: No suspicious bone lesions identified. PLIF
hardware again seen at L4-5. Moderate degenerative disc disease and
facet DJD seen at L5-S1 with grade 1 anterolisthesis.
IMPRESSION: No radiographic evidence of urinary tract carcinoma, urolithiasis,
or hydronephrosis.

Mild diffuse wall thickening and mucosal enhancement involving the
urinary bladder, suspicious for cystitis. Consider cystoscopy for
further evaluation.

Aortic atherosclerosis.

## 2016-11-12 ENCOUNTER — Encounter: Payer: Self-pay | Admitting: Urology

## 2016-11-12 ENCOUNTER — Ambulatory Visit (INDEPENDENT_AMBULATORY_CARE_PROVIDER_SITE_OTHER): Payer: PPO | Admitting: Urology

## 2016-11-12 VITALS — BP 133/72 | HR 80 | Ht 60.0 in | Wt 152.0 lb

## 2016-11-12 DIAGNOSIS — N390 Urinary tract infection, site not specified: Secondary | ICD-10-CM | POA: Diagnosis not present

## 2016-11-12 DIAGNOSIS — N3946 Mixed incontinence: Secondary | ICD-10-CM | POA: Diagnosis not present

## 2016-11-12 NOTE — Progress Notes (Signed)
11/12/2016 10:16 AM   Caroline Conway 06/29/1941 [id number]  Referring provider: Glean Hess, MD [address] [place], [place] [address]  Chief Complaint  Patient presents with  . Recurrent UTI    6wk follow up    HPI: The patient has mixed incontinence and mild frequency and nocturia. She has chronic cystitis. I think prophylaxis will be a good treatment option for her. It'll be interesting see if some of her incontinence down regulates. She thinks the beta 3 agonist does help. I will repeat baseline her voiding dysfunction and goals in the future. Pathophysiology of chronic cystitis discussed. The patient will be started on trimethoprim 100 mg of 30 tablets and 11 refills and I will reassess her in 6 weeks  The patient has urge incontinence worse than her stress incontinence and rare bedwetting. She had failed oxybutynin and Vesicare in the past  Today Frequency improved. Urgency incontinence improved. Clinically noninfected but the urine was sent for culture. She wears 3 pads a day and many of them are damp and her baseline was 6 pads per day with more severe leakage   PMH: Past Medical History:  Diagnosis Date  . Cancer ([place])    skin ca  . Collagen vascular disease (HCC)    RA  . H/O total hysterectomy   . Heartburn   . HLD (hyperlipidemia)   . Hypertension   . Neuropathy ([place])   . Rheumatoid arteritis     Surgical History: Past Surgical History:  Procedure Laterality Date  . ANTERIOR AND POSTERIOR VAGINAL REPAIR  04/2015  . APPENDECTOMY  1960  . BREAST CYST ASPIRATION Left    lt fna- neg  . [place] SURGERY  2011  . riight shoulder surgery Right 1999  . SHOULDER SURGERY Left 2006  . TOTAL KNEE ARTHROPLASTY Left 2011  . VAGINAL HYSTERECTOMY  04/2015    Home Medications:  Allergies as of 11/12/2016      Reactions   Codeine Sulfate Hives   Oxycodone Itching   Propoxyphene Hives   Penicillins Rash   Sulfa Antibiotics Rash        Medication List       Accurate as of 11/12/16 10:16 AM. Always use your most recent med list.          atenolol 50 MG tablet Commonly known as:  TENORMIN TAKE 1 TABLET BY MOUTH DAILY   DULoxetine 20 MG capsule Commonly known as:  CYMBALTA Take by mouth.   esomeprazole 20 MG capsule Commonly known as:  NEXIUM Take 1 capsule by mouth daily.   folic acid 1 MG tablet Commonly known as:  FOLVITE Take 1 tablet by mouth daily.   hydrochlorothiazide 25 MG tablet Commonly known as:  HYDRODIURIL Take 2 tablets (50 mg total) by mouth daily.   ibuprofen 600 MG tablet Commonly known as:  ADVIL,MOTRIN TAKE 1 TABLET (600 MG TOTAL) BY MOUTH EVERY 6 (SIX) HOURS AS NEEDED FOR PAIN.   meloxicam 15 MG tablet Commonly known as:  MOBIC Take 1 tablet by mouth daily.   methotrexate 2.5 MG tablet Take 8 tablets by mouth once a week.   mirabegron ER 50 MG Tb24 tablet Commonly known as:  MYRBETRIQ Take 1 tablet (50 mg total) by mouth daily.   mirtazapine 30 MG tablet Commonly known as:  REMERON TAKE 1 TABLET BY MOUTH AT BEDTIME   PREMARIN vaginal cream Generic drug:  conjugated estrogens INSERT 1 APPLICATORFUL VAGINALLY AT BEDTIME   simvastatin 20 MG  tablet Commonly known as:  ZOCOR Take 1 tablet (20 mg total) by mouth daily.   Tofacitinib Citrate 11 MG Tb24 Take by mouth.   trimethoprim 100 MG tablet Commonly known as:  TRIMPEX Take 1 tablet (100 mg total) by mouth daily.   zolpidem 5 MG tablet Commonly known as:  AMBIEN Take 1 tablet (5 mg total) by mouth at bedtime.       Allergies:  Allergies  Allergen Reactions  . Codeine Sulfate Hives  . Oxycodone Itching  . Propoxyphene Hives  . Penicillins Rash  . Sulfa Antibiotics Rash    Family History: Family History  Problem Relation Age of Onset  . Alzheimer's disease Mother   . Cancer Father   . Prostate cancer Neg Hx   . Kidney cancer Neg Hx   . Bladder Cancer Neg Hx   . Breast cancer Neg Hx     Social  History:  reports that she has never smoked. She has never used smokeless tobacco. She reports that she does not drink alcohol or use drugs.  ROS: UROLOGY Frequent Urination?: No Hard to postpone urination?: No Burning/pain with urination?: No Get up at night to urinate?: No Leakage of urine?: No Urine stream starts and stops?: No Trouble starting stream?: No Do you have to strain to urinate?: No Blood in urine?: No Urinary tract infection?: No Sexually transmitted disease?: No Injury to kidneys or bladder?: No Painful intercourse?: No Weak stream?: No Currently pregnant?: No Vaginal bleeding?: No Last menstrual period?: n  Gastrointestinal Nausea?: No Vomiting?: No Indigestion/heartburn?: No Diarrhea?: No Constipation?: No  Constitutional Fever: No Night sweats?: No Weight loss?: No Fatigue?: No  Skin Skin rash/lesions?: No Itching?: No  Eyes Blurred vision?: No Double vision?: No  Ears/Nose/Throat Sore throat?: No Sinus problems?: No  Hematologic/Lymphatic Swollen glands?: No Easy bruising?: No  Cardiovascular Leg swelling?: No Chest pain?: No  Respiratory Cough?: No Shortness of breath?: No  Endocrine Excessive thirst?: No  Musculoskeletal Back pain?: No Joint pain?: No  Neurological Headaches?: No Dizziness?: No  Psychologic Depression?: No Anxiety?: No  Physical Exam: BP 133/72   Pulse 80   Ht 5' (1.524 m)   Wt 68.9 kg (152 lb)   BMI 29.69 kg/m   Constitutional:  Alert and oriented, No acute distress.   Laboratory Data: Lab Results  Component Value Date   WBC 5.2 04/13/2010   HGB 12.5 07/15/2014   HCT 37 07/15/2014   MCV 92.7 04/13/2010   PLT 299 04/13/2010    Lab Results  Component Value Date   CREATININE 0.81 05/31/2016    No results found for: PSA  No results found for: TESTOSTERONE  No results found for: HGBA1C  Urinalysis    Component Value Date/Time   COLORURINE YELLOW 08/17/2016 1104    APPEARANCEUR Cloudy (A) 09/20/2016 1211   LABSPEC 1.015 08/17/2016 1104   LABSPEC 1.015 03/01/2013 1325   PHURINE 7.5 08/17/2016 1104   GLUCOSEU Negative 09/20/2016 1211   GLUCOSEU NEGATIVE 03/01/2013 1325   HGBUR MODERATE (A) 08/17/2016 1104   BILIRUBINUR Negative 09/20/2016 1211   BILIRUBINUR NEGATIVE 03/01/2013 1325   KETONESUR NEGATIVE 08/17/2016 1104   PROTEINUR 2+ (A) 09/20/2016 1211   PROTEINUR NEGATIVE 08/17/2016 1104   UROBILINOGEN 0.2 08/08/2016 1457   NITRITE Positive (A) 09/20/2016 1211   NITRITE NEGATIVE 08/17/2016 1104   LEUKOCYTESUR 2+ (A) 09/20/2016 1211   LEUKOCYTESUR 3+ 03/01/2013 1325    Pertinent Imaging: none  Assessment & Plan:  The patient's mixed incontinence and  urgency incontinence and infections are doing better on the beta 3 agonists and daily trimethoprim. She thinks her dry eye may be a little bit worse on the beta 3 agonists but want to stay on it. We are not any change current therapy and I will reevaluate her in 6 months   1. Recurrent UTI 2. Urgency incontinence - Urinalysis, Complete - CULTURE, URINE COMPREHENSIVE   No Follow-up on file.  Reece Packer, MD  [facility] [address], [place], [place] [address] [phone]

## 2016-11-15 LAB — CULTURE, URINE COMPREHENSIVE

## 2016-11-30 ENCOUNTER — Other Ambulatory Visit: Payer: Self-pay | Admitting: Internal Medicine

## 2016-11-30 DIAGNOSIS — F5101 Primary insomnia: Secondary | ICD-10-CM

## 2016-12-04 ENCOUNTER — Ambulatory Visit (INDEPENDENT_AMBULATORY_CARE_PROVIDER_SITE_OTHER): Payer: PPO | Admitting: Internal Medicine

## 2016-12-04 ENCOUNTER — Other Ambulatory Visit: Payer: Self-pay

## 2016-12-04 ENCOUNTER — Encounter: Payer: Self-pay | Admitting: Internal Medicine

## 2016-12-04 VITALS — BP 118/72 | HR 78 | Ht 60.0 in | Wt 149.0 lb

## 2016-12-04 DIAGNOSIS — R197 Diarrhea, unspecified: Secondary | ICD-10-CM

## 2016-12-04 DIAGNOSIS — I10 Essential (primary) hypertension: Secondary | ICD-10-CM | POA: Diagnosis not present

## 2016-12-04 DIAGNOSIS — B009 Herpesviral infection, unspecified: Secondary | ICD-10-CM | POA: Diagnosis not present

## 2016-12-04 DIAGNOSIS — A09 Infectious gastroenteritis and colitis, unspecified: Secondary | ICD-10-CM

## 2016-12-04 MED ORDER — ACYCLOVIR 200 MG PO CAPS: 200.0000 mg | ORAL_CAPSULE | Freq: Two times a day (BID) | ORAL | 0 refills | Status: DC

## 2016-12-04 MED ORDER — MIRTAZAPINE 30 MG PO TABS: 30.0000 mg | ORAL_TABLET | Freq: Every day | ORAL | 1 refills | Status: DC

## 2016-12-04 MED ORDER — METRONIDAZOLE 500 MG PO TABS: 500.0000 mg | ORAL_TABLET | Freq: Three times a day (TID) | ORAL | 0 refills | Status: DC

## 2016-12-04 NOTE — Patient Instructions (Addendum)
Consider having well water tested for parasites  Resume hydrochlorothiazide daily

## 2016-12-04 NOTE — Progress Notes (Signed)
Date:  12/04/2016   Name:  Caroline Conway   DOB:  [birth year]   MRN:  [id number]   Chief Complaint: Edema (Swelling in ankles and legs. Concerned it may be due to medication.); Constant Bowel Movement (Everytime pt eats she has to have bowel movement. Had 7 or 8 bowel movements yesterday.); and Mouth Lesions (Would like Acyclovir.)  Mouth Lesions   The problem occurs frequently. The problem is moderate (takes acyclovir episodically). Associated symptoms include diarrhea and mouth sores. Pertinent negatives include no fever, no abdominal pain, no vomiting and no headaches.  Diarrhea   This is a new problem. The current episode started in the past 7 days. The problem occurs 5 to 10 times per day. The problem has been unchanged. Diarrhea characteristics: soft but no watery; no blood or mucus.  A lot of flatus as well. The patient states that diarrhea does not awaken her from sleep. Associated symptoms include bloating, chills, increased flatus and sweats. Pertinent negatives include no abdominal pain, fever, headaches or vomiting. Nothing aggravates the symptoms. Risk factors: has well water. She has tried change of diet for the symptoms. The treatment provided no relief.   Edema - some ankle edema over the past few weeks.  On questioning, she has not been taking her HCTZ.  She has some tenderness but no redness or open lesions.  Edema worsens during the day and improves over night.    Review of Systems  Constitutional: Positive for chills. Negative for fatigue, fever and unexpected weight change.  HENT: Positive for mouth sores.   Eyes: Negative for visual disturbance.  Respiratory: Negative for chest tightness and shortness of breath.   Cardiovascular: Positive for leg swelling. Negative for chest pain and palpitations.  Gastrointestinal: Positive for abdominal distention, bloating, diarrhea and flatus. Negative for abdominal pain, rectal pain and vomiting.  Genitourinary: Negative for  dysuria.  Neurological: Negative for dizziness, light-headedness and headaches.    Patient Active Problem List   Diagnosis Date Noted  . Herpes simplex infection 07/07/2015  . Anxiety disorder 01/29/2015  . Atrophic vaginitis 01/29/2015  . Dyslipidemia 01/29/2015  . Essential (primary) hypertension 01/29/2015  . Acid reflux 01/29/2015  . Depression, major, single episode, in partial remission ([place]) 01/29/2015  . Idiopathic peripheral neuropathy 01/29/2015  . Idiopathic insomnia 01/29/2015  . Rheumatoid arthritis involving multiple joints ([place]) 01/29/2015  . Urge incontinence 01/29/2015  . Pelvic relaxation due to vaginal prolapse 01/29/2015    Prior to Admission medications   Medication Sig Start Date End Date Taking? Authorizing Provider  atenolol (TENORMIN) 50 MG tablet TAKE 1 TABLET BY MOUTH DAILY 07/16/16  Yes Glean Hess, MD  DULoxetine (CYMBALTA) 20 MG capsule Take by mouth. 06/22/16 06/22/17 Yes Historical Provider, MD  esomeprazole (NEXIUM) 20 MG capsule Take 1 capsule by mouth daily.   Yes Historical Provider, MD  folic acid (FOLVITE) 1 MG tablet Take 1 tablet by mouth daily.   Yes Historical Provider, MD  hydrochlorothiazide (HYDRODIURIL) 25 MG tablet Take 2 tablets (50 mg total) by mouth daily. 05/01/16  Yes Glean Hess, MD  ibuprofen (ADVIL,MOTRIN) 600 MG tablet TAKE 1 TABLET (600 MG TOTAL) BY MOUTH EVERY 6 (SIX) HOURS AS NEEDED FOR PAIN. 04/05/15  Yes Historical Provider, MD  meloxicam (MOBIC) 15 MG tablet Take 1 tablet by mouth daily.   Yes Historical Provider, MD  methotrexate 2.5 MG tablet Take 8 tablets by mouth once a week.   Yes Historical Provider, MD  mirabegron ER [facility])  50 MG TB24 tablet Take 1 tablet (50 mg total) by mouth daily. 09/11/16  Yes Shannon A McGowan, PA-C  mirtazapine (REMERON) 30 MG tablet Take 1 tablet (30 mg total) by mouth at bedtime. 12/04/16  Yes Glean Hess, MD  PREMARIN vaginal cream INSERT 1 APPLICATORFUL VAGINALLY AT BEDTIME  12/13/15  Yes Glean Hess, MD  simvastatin (ZOCOR) 20 MG tablet Take 1 tablet (20 mg total) by mouth daily. 05/01/16  Yes Glean Hess, MD  trimethoprim (TRIMPEX) 100 MG tablet Take 1 tablet (100 mg total) by mouth daily. 10/03/16  Yes Bjorn Loser, MD  zolpidem (AMBIEN) 5 MG tablet TAKE 1 TABLET BY MOUTH AT BEDTIME 11/30/16  Yes Glean Hess, MD    Allergies  Allergen Reactions  . Codeine Sulfate Hives  . Oxycodone Itching  . Propoxyphene Hives  . Penicillins Rash  . Sulfa Antibiotics Rash    Past Surgical History:  Procedure Laterality Date  . ANTERIOR AND POSTERIOR VAGINAL REPAIR  04/2015  . APPENDECTOMY  1960  . BREAST CYST ASPIRATION Left    lt fna- neg  . [place] SURGERY  2011  . riight shoulder surgery Right 1999  . SHOULDER SURGERY Left 2006  . TOTAL KNEE ARTHROPLASTY Left 2011  . VAGINAL HYSTERECTOMY  04/2015    Social History  Substance Use Topics  . Smoking status: Never Smoker  . Smokeless tobacco: Never Used  . Alcohol use No     Medication list has been reviewed and updated.   Physical Exam  Constitutional: She is oriented to person, place, and time. She appears well-developed. No distress.  HENT:  Head: Normocephalic and atraumatic.  Neck: Normal range of motion.  Cardiovascular: Normal rate, regular rhythm and normal heart sounds.   Pulmonary/Chest: Effort normal and breath sounds normal. No respiratory distress. She has no wheezes.  Abdominal: Normal appearance. Bowel sounds are decreased. There is no tenderness. There is no CVA tenderness.  Musculoskeletal: She exhibits edema (1+ ankle edema bilaterally) and tenderness.  Neurological: She is alert and oriented to person, place, and time.  Skin: Skin is warm and dry. No rash noted.  Psychiatric: She has a normal mood and affect. Her behavior is normal. Thought content normal.  Nursing note and vitals reviewed.   BP 118/72 (BP Location: Right Arm, Patient Position: Sitting, Cuff  Size: Normal)   Pulse 78   Ht 5' (1.524 m)   Wt 149 lb (67.6 kg)   SpO2 98%   BMI 29.10 kg/m   Assessment and Plan: 1. Diarrhea of presumed infectious origin Treat presumtively with [place] - follow up if no improvement  2. Herpes simplex infection Acyclovir PRN  3. Essential (primary) hypertension Controlled Resume HCTZ for edema   Meds ordered this encounter  Medications  . acyclovir (ZOVIRAX) 200 MG capsule    Sig: Take 1 capsule (200 mg total) by mouth 2 (two) times daily.    Dispense:  20 capsule    Refill:  0  . metroNIDAZOLE (FLAGYL) 500 MG tablet    Sig: Take 1 tablet (500 mg total) by mouth 3 (three) times daily.    Dispense:  21 tablet    Refill:  0    Halina Maidens, MD [facility]  12/04/2016

## 2016-12-26 DIAGNOSIS — M810 Age-related osteoporosis without current pathological fracture: Secondary | ICD-10-CM | POA: Diagnosis not present

## 2016-12-26 DIAGNOSIS — M159 Polyosteoarthritis, unspecified: Secondary | ICD-10-CM | POA: Diagnosis not present

## 2016-12-26 DIAGNOSIS — M25519 Pain in unspecified shoulder: Secondary | ICD-10-CM | POA: Diagnosis not present

## 2016-12-26 DIAGNOSIS — M25512 Pain in left shoulder: Secondary | ICD-10-CM | POA: Diagnosis not present

## 2016-12-26 DIAGNOSIS — N390 Urinary tract infection, site not specified: Secondary | ICD-10-CM | POA: Diagnosis not present

## 2016-12-26 DIAGNOSIS — M0579 Rheumatoid arthritis with rheumatoid factor of multiple sites without organ or systems involvement: Secondary | ICD-10-CM | POA: Diagnosis not present

## 2016-12-26 DIAGNOSIS — S4992XA Unspecified injury of left shoulder and upper arm, initial encounter: Secondary | ICD-10-CM | POA: Diagnosis not present

## 2016-12-26 DIAGNOSIS — E559 Vitamin D deficiency, unspecified: Secondary | ICD-10-CM | POA: Diagnosis not present

## 2016-12-26 DIAGNOSIS — Z79899 Other long term (current) drug therapy: Secondary | ICD-10-CM | POA: Diagnosis not present

## 2016-12-27 ENCOUNTER — Telehealth: Payer: Self-pay

## 2016-12-27 NOTE — Telephone Encounter (Signed)
The pt called complaining of smelly urine, cloudy urine, and frequency. She said she had her urine checked yesterday by her Rheumatologist and they told her she had a chronic UTI. I informed the pt that the only way you can truly confirm a UTI is by a culture and since we can not retreive the UA we need her to come in on the Nurse Schedule for a cath UA & Urine Cx.  The pt verbalize understanding. Appt scheduled for tomorrow at 145pm.

## 2016-12-28 ENCOUNTER — Ambulatory Visit (INDEPENDENT_AMBULATORY_CARE_PROVIDER_SITE_OTHER): Payer: PPO

## 2016-12-28 VITALS — BP 114/71 | HR 64 | Ht 59.0 in | Wt 154.0 lb

## 2016-12-28 DIAGNOSIS — N390 Urinary tract infection, site not specified: Secondary | ICD-10-CM | POA: Diagnosis not present

## 2016-12-28 LAB — MICROSCOPIC EXAMINATION

## 2016-12-28 LAB — URINALYSIS, COMPLETE
Bilirubin, UA: NEGATIVE
Glucose, UA: NEGATIVE
KETONES UA: NEGATIVE
Nitrite, UA: NEGATIVE
PH UA: 7 (ref 5.0–7.5)
SPEC GRAV UA: 1.01 (ref 1.005–1.030)
Urobilinogen, Ur: 0.2 mg/dL (ref 0.2–1.0)

## 2016-12-28 NOTE — Progress Notes (Signed)
Pt presents today with c/o urinary frequency and urgency, and leakage of urine. Pt has been on abx in the last 30 days. A CATH specimen was obtained for u/a and cx.  Blood pressure 114/71, pulse 64, height 4\' 11"  (1.499 m), weight 154 lb (69.9 kg).

## 2016-12-31 ENCOUNTER — Telehealth: Payer: Self-pay

## 2016-12-31 DIAGNOSIS — N390 Urinary tract infection, site not specified: Secondary | ICD-10-CM

## 2016-12-31 LAB — CULTURE, URINE COMPREHENSIVE

## 2016-12-31 MED ORDER — CIPROFLOXACIN HCL 250 MG PO TABS: 250.0000 mg | ORAL_TABLET | Freq: Two times a day (BID) | ORAL | 0 refills | Status: DC

## 2016-12-31 NOTE — Telephone Encounter (Signed)
-----   Message from Nori Riis, PA-C sent at 12/31/2016  7:59 AM EDT ----- Please let Mrs. Repsher know that her urine culture was positive.  She needs to hold the trimethoprim and start Cipro 250 mg, one tablet twice daily #14.  Once she finishes the Cipro, she needs to resume the trimethoprim.

## 2016-12-31 NOTE — Telephone Encounter (Signed)
The pt called right back after I spoke w/ her stating she used Cipro before and it didn't work for her. I informed the pt that we sent the urine to the lab and a culture was done. The culture results came back stating that Cipro was the best abx to treat the bacteria that the culture grew.

## 2016-12-31 NOTE — Telephone Encounter (Signed)
Pt has been made aware of +ucx and abx sent to pharmacy.

## 2016-12-31 NOTE — Telephone Encounter (Signed)
The pt called requesting urine culture results. She was notified and informed she needs to start on Cipro 250 bid. Prescription sent to the pharmacy.

## 2017-01-24 DIAGNOSIS — F5104 Psychophysiologic insomnia: Secondary | ICD-10-CM | POA: Diagnosis not present

## 2017-01-24 DIAGNOSIS — R2689 Other abnormalities of gait and mobility: Secondary | ICD-10-CM | POA: Diagnosis not present

## 2017-01-24 DIAGNOSIS — G629 Polyneuropathy, unspecified: Secondary | ICD-10-CM | POA: Diagnosis not present

## 2017-01-24 DIAGNOSIS — G609 Hereditary and idiopathic neuropathy, unspecified: Secondary | ICD-10-CM | POA: Diagnosis not present

## 2017-01-30 DIAGNOSIS — H40003 Preglaucoma, unspecified, bilateral: Secondary | ICD-10-CM | POA: Diagnosis not present

## 2017-02-11 DIAGNOSIS — H40003 Preglaucoma, unspecified, bilateral: Secondary | ICD-10-CM | POA: Diagnosis not present

## 2017-02-13 ENCOUNTER — Encounter: Payer: Self-pay | Admitting: Internal Medicine

## 2017-02-13 ENCOUNTER — Ambulatory Visit (INDEPENDENT_AMBULATORY_CARE_PROVIDER_SITE_OTHER): Payer: PPO | Admitting: Internal Medicine

## 2017-02-13 VITALS — BP 108/66 | HR 58 | Ht 60.0 in | Wt 154.0 lb

## 2017-02-13 DIAGNOSIS — N289 Disorder of kidney and ureter, unspecified: Secondary | ICD-10-CM | POA: Diagnosis not present

## 2017-02-13 DIAGNOSIS — I10 Essential (primary) hypertension: Secondary | ICD-10-CM

## 2017-02-13 DIAGNOSIS — I872 Venous insufficiency (chronic) (peripheral): Secondary | ICD-10-CM | POA: Diagnosis not present

## 2017-02-13 DIAGNOSIS — G609 Hereditary and idiopathic neuropathy, unspecified: Secondary | ICD-10-CM | POA: Diagnosis not present

## 2017-02-13 NOTE — Progress Notes (Signed)
Date:  02/13/2017   Name:  Caroline Conway   DOB:  1941-06-25   MRN:  [id number]   Chief Complaint: Hypertension (Also, Here to discuss with PCP about Creatnine level from Neuro. )  Glucose 98 70 - 110 mg/dL  Sodium 138 136 - 145 mmol/L  Potassium 4.6 3.6 - 5.1 mmol/L  Chloride 102 97 - 109 mmol/L  Carbon Dioxide (CO2) 30.6 22.0 - 32.0 mmol/L  Urea Nitrogen (BUN) 31 (H) 7 - 25 mg/dL  Creatinine 1.2 (H) 0.6 - 1.1 mg/dL  Glomerular Filtration Rate (eGFR), MDRD Estimate 44 (L) >60 mL/min/1.73sq m  Calcium 9.6 8.7 - 10.3 mg/dL  AST  17 8 - 39 U/L  ALT  20 5 - 38 U/L  Alk Phos (alkaline Phosphatase) 65 34 - 104 U/L  Albumin 4.2 3.5 - 4.8 g/dL  Bilirubin, Total 0.3 0.3 - 1.2 mg/dL  Protein, Total 7.4 6.1 - 7.9 g/dL  A/G Ratio 1.3 1.0 - 5.0 gm/dL   Hypertension  This is a chronic problem. The problem has been gradually improving since onset. Pertinent negatives include no chest pain, palpitations or shortness of breath. Past treatments include diuretics. Hypertensive end-organ damage includes kidney disease (mild increase in creatnine).  Edema - On HCTZ twice a day but edema is not improving.  It is better in the AM but progresses as day goes on.  She stays busy and does not elevate much until the evening. No PND or Orthopnea, able to sleep in bed on one pillow. Renal insuff - noted on recent labs at [facility].  Last creatinine here was 5 months ago and was normal.  Medication reviewed - high dose HCTZ but no other medications suspected as the cause.    Review of Systems  Constitutional: Negative for chills, diaphoresis, fatigue and fever.  Respiratory: Negative for choking, chest tightness, shortness of breath and wheezing.   Cardiovascular: Positive for leg swelling. Negative for chest pain and palpitations.  Gastrointestinal: Negative for abdominal pain.  Genitourinary: Negative for difficulty urinating.  Musculoskeletal: Positive for arthralgias and joint swelling.  Skin: Negative  for wound.  Hematological: Negative for adenopathy.  Psychiatric/Behavioral: Positive for sleep disturbance.    Patient Active Problem List   Diagnosis Date Noted  . Herpes simplex infection 07/07/2015  . Anxiety disorder 01/29/2015  . Atrophic vaginitis 01/29/2015  . Dyslipidemia 01/29/2015  . Essential (primary) hypertension 01/29/2015  . Acid reflux 01/29/2015  . Depression, major, single episode, in partial remission ([place]) 01/29/2015  . Idiopathic peripheral neuropathy 01/29/2015  . Idiopathic insomnia 01/29/2015  . Rheumatoid arthritis involving multiple joints ([place]) 01/29/2015  . Urge incontinence 01/29/2015  . Pelvic relaxation due to vaginal prolapse 01/29/2015    Prior to Admission medications   Medication Sig Start Date End Date Taking? Authorizing Provider  acyclovir (ZOVIRAX) 200 MG capsule Take 1 capsule (200 mg total) by mouth 2 (two) times daily. 12/04/16  Yes Glean Hess, MD  atenolol (TENORMIN) 50 MG tablet TAKE 1 TABLET BY MOUTH DAILY 07/16/16  Yes Glean Hess, MD  DULoxetine (CYMBALTA) 20 MG capsule Take by mouth. 06/22/16 06/22/17 Yes [provider]  esomeprazole (NEXIUM) 20 MG capsule Take 1 capsule by mouth daily.   Yes [provider]  folic acid (FOLVITE) 1 MG tablet Take 1 tablet by mouth daily.   Yes [provider]  gabapentin (NEURONTIN) 100 MG capsule TAKE 1 TABLET IN AM AND 3 TABLETS AT NIGHT 12/18/16  Yes [provider]  hydrochlorothiazide (  HYDRODIURIL) 25 MG tablet Take 2 tablets (50 mg total) by mouth daily. 05/01/16  Yes Glean Hess, MD  ibuprofen (ADVIL,MOTRIN) 600 MG tablet TAKE 1 TABLET (600 MG TOTAL) BY MOUTH EVERY 6 (SIX) HOURS AS NEEDED FOR PAIN. 04/05/15  Yes [provider]  meloxicam (MOBIC) 15 MG tablet Take 1 tablet by mouth daily.   Yes [provider]  methotrexate 2.5 MG tablet Take 8 tablets by mouth once a week.   Yes [provider]  mirabegron ER  (MYRBETRIQ) 50 MG TB24 tablet Take 1 tablet (50 mg total) by mouth daily. 09/11/16  Yes McGowan, Larene Beach A, PA-C  mirtazapine (REMERON) 30 MG tablet Take 1 tablet (30 mg total) by mouth at bedtime. 12/04/16  Yes Glean Hess, MD  PREMARIN vaginal cream INSERT 1 APPLICATORFUL VAGINALLY AT BEDTIME 12/13/15  Yes Glean Hess, MD  simvastatin (ZOCOR) 20 MG tablet Take 1 tablet (20 mg total) by mouth daily. 05/01/16  Yes Glean Hess, MD  trimethoprim (TRIMPEX) 100 MG tablet Take 1 tablet (100 mg total) by mouth daily. 10/03/16  Yes MacDiarmid, Nicki Reaper, MD  zolpidem (AMBIEN) 5 MG tablet TAKE 1 TABLET BY MOUTH AT BEDTIME 11/30/16  Yes Glean Hess, MD    Allergies  Allergen Reactions  . Codeine Sulfate Hives  . Oxycodone Itching  . Propoxyphene Hives  . Penicillins Rash  . Sulfa Antibiotics Rash    Past Surgical History:  Procedure Laterality Date  . ANTERIOR AND POSTERIOR VAGINAL REPAIR  04/2015  . APPENDECTOMY  1960  . BREAST CYST ASPIRATION Left    lt fna- neg  . [place] SURGERY  2011  . riight shoulder surgery Right 1999  . SHOULDER SURGERY Left 2006  . TOTAL KNEE ARTHROPLASTY Left 2011  . VAGINAL HYSTERECTOMY  04/2015    Social History  Substance Use Topics  . Smoking status: Never Smoker  . Smokeless tobacco: Never Used  . Alcohol use No    Medication list has been reviewed and updated.   Physical Exam  Constitutional: She is oriented to person, place, and time. She appears well-developed. No distress.  HENT:  Head: Normocephalic and atraumatic.  Neck: Normal range of motion. Neck supple. No hepatojugular reflux present. Carotid bruit is not present.  Cardiovascular: Normal rate, regular rhythm and normal heart sounds.   No murmur heard. Pulmonary/Chest: Effort normal and breath sounds normal. No respiratory distress. She has no wheezes.  Abdominal: Soft. Bowel sounds are normal.  Musculoskeletal: She exhibits edema (1+ to knee bilaterally) and tenderness.   Neurological: She is alert and oriented to person, place, and time.  Skin: Skin is warm and dry. No rash noted.  Psychiatric: She has a normal mood and affect. Her behavior is normal. Thought content normal.  Nursing note and vitals reviewed.   BP 108/66 (BP Location: Right Arm, Patient Position: Sitting, Cuff Size: Normal)   Pulse (!) 58   Ht 5' (1.524 m)   Wt 154 lb (69.9 kg)   SpO2 96%   BMI 30.08 kg/m   Assessment and Plan: 1. Essential (primary) hypertension controlled  2. Idiopathic peripheral neuropathy Continue gabapentin  3. Venous insufficiency of both lower extremities Begin compression stockings  4. Renal insufficiency Reduce HCTZ to once per day   No orders of the defined types were placed in this encounter.   Halina Maidens, MD [facility]  02/13/2017

## 2017-02-13 NOTE — Progress Notes (Deleted)
Glucose 98 70 - 110 mg/dL  Sodium 138 136 - 145 mmol/L  Potassium 4.6 3.6 - 5.1 mmol/L  Chloride 102 97 - 109 mmol/L  Carbon Dioxide (CO2) 30.6 22.0 - 32.0 mmol/L  Urea Nitrogen (BUN) 31 (H) 7 - 25 mg/dL  Creatinine 1.2 (H) 0.6 - 1.1 mg/dL  Glomerular Filtration Rate (eGFR), MDRD Estimate 44 (L) >60 mL/min/1.73sq m  Calcium 9.6 8.7 - 10.3 mg/dL  AST  17 8 - 39 U/L  ALT  20 5 - 38 U/L  Alk Phos (alkaline Phosphatase) 65 34 - 104 U/L  Albumin 4.2 3.5 - 4.8 g/dL  Bilirubin, Total 0.3 0.3 - 1.2 mg/dL  Protein, Total 7.4 6.1 - 7.9 g/dL  A/G Ratio 1.3 1.0 - 5.0 gm/dL

## 2017-02-13 NOTE — Patient Instructions (Addendum)
Stop one of the HCTZ - take only one per day  Compression stockings - on in the AM and remove at bedtime or when relaxing for the evening with legs elevated

## 2017-03-12 ENCOUNTER — Telehealth: Payer: Self-pay | Admitting: Urology

## 2017-03-12 NOTE — Telephone Encounter (Signed)
Spoke to pt, she c/o : urine urgency, urine frequency, and burning. Apologized to pt, advised just now able to get to messages due to being short staffed in clinic. Patient verbalized understanding. Advised pt to drink plenty of water, and to  try and treat otc and she would need to provide urine sample when the office opens after the holiday on 07/05 being that it is 15 mins to closing. Patient agreed.

## 2017-03-12 NOTE — Telephone Encounter (Signed)
Pt thinks she has another bladder infection.  Please give pt a call

## 2017-03-19 ENCOUNTER — Encounter: Payer: Self-pay | Admitting: Internal Medicine

## 2017-03-19 ENCOUNTER — Ambulatory Visit
Admission: RE | Admit: 2017-03-19 | Discharge: 2017-03-19 | Disposition: A | Discharge status: Home / Self Care | Payer: PPO | Source: Ambulatory Visit | Attending: Internal Medicine | Admitting: Internal Medicine

## 2017-03-19 ENCOUNTER — Ambulatory Visit (INDEPENDENT_AMBULATORY_CARE_PROVIDER_SITE_OTHER): Payer: PPO | Admitting: Internal Medicine

## 2017-03-19 VITALS — BP 128/64 | HR 54 | Ht 60.0 in | Wt 155.0 lb

## 2017-03-19 DIAGNOSIS — R059 Cough, unspecified: Secondary | ICD-10-CM

## 2017-03-19 DIAGNOSIS — N183 Chronic kidney disease, stage 3 unspecified: Secondary | ICD-10-CM

## 2017-03-19 DIAGNOSIS — R05 Cough: Secondary | ICD-10-CM

## 2017-03-19 DIAGNOSIS — I872 Venous insufficiency (chronic) (peripheral): Secondary | ICD-10-CM

## 2017-03-19 DIAGNOSIS — I10 Essential (primary) hypertension: Secondary | ICD-10-CM | POA: Diagnosis not present

## 2017-03-19 NOTE — Patient Instructions (Signed)
Consider adding Allegra 180 mg/Claritin 10 mg  once a day for cough

## 2017-03-19 NOTE — Progress Notes (Signed)
Date:  03/19/2017   Name:  Caroline Conway   DOB:  08/13/1941   MRN:  [id number]   Chief Complaint: Edema (now taking hydroclorothiazide to once daily. Can't compression stockings everyday because of hot weather. - still having swelling. ) Hypertension  This is a chronic problem. The problem is unchanged. The problem is controlled. Associated symptoms include peripheral edema and shortness of breath. Pertinent negatives include no chest pain, headaches or palpitations.  Cough  This is a chronic problem. The current episode started more than 1 year ago. The problem has been unchanged. The problem occurs every few hours. The cough is productive of sputum (yellow sputum at times). Associated symptoms include shortness of breath. Pertinent negatives include no chest pain, chills, fever, headaches or wheezing. The symptoms are aggravated by lying down. There is no history of environmental allergies.  Renal insuff - HCTZ dose reduced last visit.  BP and edema have remained the same.    Review of Systems  Constitutional: Negative for chills, fatigue and fever.  Respiratory: Positive for cough and shortness of breath. Negative for chest tightness and wheezing.   Cardiovascular: Positive for leg swelling. Negative for chest pain and palpitations.  Gastrointestinal: Negative for abdominal pain.       Intermittent gerd  Genitourinary: Positive for dysuria (recently - took keflex and sx resolved). Negative for difficulty urinating.  Musculoskeletal: Positive for arthralgias.  Allergic/Immunologic: Negative for environmental allergies.  Neurological: Negative for dizziness and headaches.  Psychiatric/Behavioral: Negative for sleep disturbance.    Patient Active Problem List   Diagnosis Date Noted  . Chronic renal insufficiency, stage 3 (moderate) 03/19/2017  . Venous insufficiency of both lower extremities 02/13/2017  . Herpes simplex infection 07/07/2015  . Anxiety disorder 01/29/2015  .  Atrophic vaginitis 01/29/2015  . Dyslipidemia 01/29/2015  . Essential (primary) hypertension 01/29/2015  . Acid reflux 01/29/2015  . Depression, major, single episode, in partial remission ([place]) 01/29/2015  . Idiopathic peripheral neuropathy 01/29/2015  . Idiopathic insomnia 01/29/2015  . Rheumatoid arthritis involving multiple joints ([place]) 01/29/2015  . Urge incontinence 01/29/2015  . Pelvic relaxation due to vaginal prolapse 01/29/2015    Prior to Admission medications   Medication Sig Start Date End Date Taking? Authorizing Provider  acyclovir (ZOVIRAX) 200 MG capsule Take 1 capsule (200 mg total) by mouth 2 (two) times daily. 12/04/16  Yes Glean Hess, MD  atenolol (TENORMIN) 50 MG tablet TAKE 1 TABLET BY MOUTH DAILY 07/16/16  Yes Glean Hess, MD  DULoxetine (CYMBALTA) 20 MG capsule Take by mouth. 06/22/16 06/22/17 Yes [provider]  esomeprazole (NEXIUM) 20 MG capsule Take 1 capsule by mouth daily.   Yes [provider]  folic acid (FOLVITE) 1 MG tablet Take 1 tablet by mouth daily.   Yes [provider]  gabapentin (NEURONTIN) 100 MG capsule TAKE 1 TABLET IN AM AND 3 TABLETS AT NIGHT 12/18/16  Yes [provider]  hydrochlorothiazide (HYDRODIURIL) 25 MG tablet Take 2 tablets (50 mg total) by mouth daily. Patient taking differently: Take 25 mg by mouth daily.  05/01/16  Yes Glean Hess, MD  meloxicam (MOBIC) 15 MG tablet Take 1 tablet by mouth daily.   Yes [provider]  methotrexate 2.5 MG tablet Take 8 tablets by mouth once a week.   Yes [provider]  mirabegron ER (MYRBETRIQ) 50 MG TB24 tablet Take 1 tablet (50 mg total) by mouth daily. 09/11/16  Yes McGowan, Larene Beach A, PA-C  mirtazapine (REMERON)  30 MG tablet Take 1 tablet (30 mg total) by mouth at bedtime. 12/04/16  Yes Glean Hess, MD  PREMARIN vaginal cream INSERT 1 APPLICATORFUL VAGINALLY AT BEDTIME 12/13/15  Yes Glean Hess, MD  simvastatin  (ZOCOR) 20 MG tablet Take 1 tablet (20 mg total) by mouth daily. 05/01/16  Yes Glean Hess, MD  trimethoprim (TRIMPEX) 100 MG tablet Take 1 tablet (100 mg total) by mouth daily. 10/03/16  Yes MacDiarmid, Nicki Reaper, MD  zolpidem (AMBIEN) 5 MG tablet TAKE 1 TABLET BY MOUTH AT BEDTIME 11/30/16  Yes Glean Hess, MD    Allergies  Allergen Reactions  . Codeine Sulfate Hives  . Oxycodone Itching  . Propoxyphene Hives  . Penicillins Rash  . Sulfa Antibiotics Rash    Past Surgical History:  Procedure Laterality Date  . ANTERIOR AND POSTERIOR VAGINAL REPAIR  04/2015  . APPENDECTOMY  1960  . BREAST CYST ASPIRATION Left    lt fna- neg  . [place] SURGERY  2011  . riight shoulder surgery Right 1999  . SHOULDER SURGERY Left 2006  . TOTAL KNEE ARTHROPLASTY Left 2011  . VAGINAL HYSTERECTOMY  04/2015    Social History  Substance Use Topics  . Smoking status: Never Smoker  . Smokeless tobacco: Never Used  . Alcohol use No     Medication list has been reviewed and updated.   Physical Exam  Constitutional: She is oriented to person, place, and time. She appears well-developed. No distress.  HENT:  Head: Normocephalic and atraumatic.  Neck: Normal range of motion. Neck supple.  Cardiovascular: Normal rate, regular rhythm and normal heart sounds.   Pulmonary/Chest: Effort normal and breath sounds normal. No respiratory distress. She has no wheezes. She has no rales.  Abdominal: Soft. Bowel sounds are normal. There is no tenderness.  Musculoskeletal: She exhibits edema (1+ at ankles bilaterally) and tenderness.  Neurological: She is alert and oriented to person, place, and time.  Skin: Skin is warm and dry. No rash noted.  Psychiatric: She has a normal mood and affect. Her behavior is normal. Thought content normal.  Nursing note and vitals reviewed.   BP 128/64   Pulse (!) 54   Ht 5' (1.524 m)   Wt 155 lb (70.3 kg)   SpO2 97%   BMI 30.27 kg/m   Assessment and Plan: 1.  Essential (primary) hypertension controlled  2. Venous insufficiency of both lower extremities Continue elevation and compression stockings  3. Chronic renal insufficiency, stage 3 (moderate) Check labs - Basic metabolic panel  4. Cough Rule out pulmonary process Suspect allergic vs LPR - DG Chest 2 View; Future - CBC with Differential/Platelet   No orders of the defined types were placed in this encounter.   Halina Maidens, MD [facility]  03/19/2017

## 2017-03-20 LAB — BASIC METABOLIC PANEL
BUN / CREAT RATIO: 22 (ref 12–28)
BUN: 22 mg/dL (ref 8–27)
CALCIUM: 9.6 mg/dL (ref 8.7–10.3)
CHLORIDE: 100 mmol/L (ref 96–106)
CO2: 23 mmol/L (ref 20–29)
Creatinine, Ser: 1 mg/dL (ref 0.57–1.00)
GFR calc Af Amer: 64 mL/min/{1.73_m2} (ref >59)
GFR calc non Af Amer: 55 mL/min/{1.73_m2} — ABNORMAL LOW (ref >59)
GLUCOSE: 100 mg/dL — AB (ref 65–99)
POTASSIUM: 4.7 mmol/L (ref 3.5–5.2)
Sodium: 139 mmol/L (ref 134–144)

## 2017-03-20 LAB — CBC WITH DIFFERENTIAL/PLATELET
BASOS: 1 %
Basophils Absolute: 0.1 10*3/uL (ref 0.0–0.2)
EOS (ABSOLUTE): 0.3 10*3/uL (ref 0.0–0.4)
Eos: 5 %
HEMATOCRIT: 34.7 % (ref 34.0–46.6)
Hemoglobin: 11.6 g/dL (ref 11.1–15.9)
Immature Grans (Abs): 0 10*3/uL (ref 0.0–0.1)
Immature Granulocytes: 0 %
LYMPHS ABS: 1.8 10*3/uL (ref 0.7–3.1)
Lymphs: 32 %
MCH: 31.4 pg (ref 26.6–33.0)
MCHC: 33.4 g/dL (ref 31.5–35.7)
MCV: 94 fL (ref 79–97)
MONOS ABS: 0.4 10*3/uL (ref 0.1–0.9)
Monocytes: 7 %
Neutrophils Absolute: 3.2 10*3/uL (ref 1.4–7.0)
Neutrophils: 55 %
PLATELETS: 276 10*3/uL (ref 150–379)
RBC: 3.7 x10E6/uL — AB (ref 3.77–5.28)
RDW: 15.6 % — AB (ref 12.3–15.4)
WBC: 5.7 10*3/uL (ref 3.4–10.8)

## 2017-03-21 ENCOUNTER — Other Ambulatory Visit: Payer: Self-pay | Admitting: Internal Medicine

## 2017-03-21 DIAGNOSIS — R9389 Abnormal findings on diagnostic imaging of other specified body structures: Secondary | ICD-10-CM | POA: Insufficient documentation

## 2017-03-21 DIAGNOSIS — R05 Cough: Secondary | ICD-10-CM

## 2017-03-21 DIAGNOSIS — R053 Chronic cough: Secondary | ICD-10-CM

## 2017-03-21 NOTE — Progress Notes (Signed)
Spoke to pt about CXR- pt would like to be referred to [facility].

## 2017-04-01 ENCOUNTER — Ambulatory Visit (INDEPENDENT_AMBULATORY_CARE_PROVIDER_SITE_OTHER): Payer: PPO

## 2017-04-01 VITALS — BP 150/84 | HR 78 | Temp 98.3°F | Resp 16 | Ht 60.0 in | Wt 156.0 lb

## 2017-04-01 DIAGNOSIS — Z0000 Encounter for general adult medical examination without abnormal findings: Secondary | ICD-10-CM | POA: Diagnosis not present

## 2017-04-01 NOTE — Patient Instructions (Addendum)
Caroline Conway , Thank you for taking time to come for your Medicare Wellness Visit. I appreciate your ongoing commitment to your health goals. Please review the following plan we discussed and let me know if I can assist you in the future.   Screening recommendations/referrals: Colonoscopy: completed 09/11/2006 Mammogram: completed 10/25/2016 Bone Density: completed 12/26/2016 Recommended yearly ophthalmology/optometry visit for glaucoma screening and checkup Recommended yearly dental visit for hygiene and checkup  Vaccinations: Influenza vaccine: up to date, due 06/2017 Pneumococcal vaccine: up to date Tdap vaccine: up to date Shingles vaccine:declined  Advanced directives: Advance directive discussed with you today. I have provided a copy for you to complete at home and have notarized. Once this is complete please bring a copy in to our office so we can scan it into your chart.  Conditions/risks identified: Recommend drinking at least 5-6 glasses of water a day   Next appointment: Follow up in one year for your annual wellness exam.    Preventive Care 65 Years and Older, Female Preventive care refers to lifestyle choices and visits with your health care provider that can promote health and wellness. What does preventive care include?  A yearly physical exam. This is also called an annual well check.  Dental exams once or twice a year.  Routine eye exams. Ask your health care provider how often you should have your eyes checked.  Personal lifestyle choices, including:  Daily care of your teeth and gums.  Regular physical activity.  Eating a healthy diet.  Avoiding tobacco and drug use.  Limiting alcohol use.  Practicing safe sex.  Taking low-dose aspirin every day.  Taking vitamin and mineral supplements as recommended by your health care provider. What happens during an annual well check? The services and screenings done by your health care provider during your annual  well check will depend on your age, overall health, lifestyle risk factors, and family history of disease. Counseling  Your health care provider may ask you questions about your:  Alcohol use.  Tobacco use.  Drug use.  Emotional well-being.  Home and relationship well-being.  Sexual activity.  Eating habits.  History of falls.  Memory and ability to understand (cognition).  Work and work Statistician.  Reproductive health. Screening  You may have the following tests or measurements:  Height, weight, and BMI.  Blood pressure.  Lipid and cholesterol levels. These may be checked every 5 years, or more frequently if you are over 69 years old.  Skin check.  Lung cancer screening. You may have this screening every year starting at age 63 if you have a 30-pack-year history of smoking and currently smoke or have quit within the past 15 years.  Fecal occult blood test (FOBT) of the stool. You may have this test every year starting at age 31.  Flexible sigmoidoscopy or colonoscopy. You may have a sigmoidoscopy every 5 years or a colonoscopy every 10 years starting at age 42.  Hepatitis C blood test.  Hepatitis B blood test.  Sexually transmitted disease (STD) testing.  Diabetes screening. This is done by checking your blood sugar (glucose) after you have not eaten for a while (fasting). You may have this done every 1-3 years.  Bone density scan. This is done to screen for osteoporosis. You may have this done starting at age 65.  Mammogram. This may be done every 1-2 years. Talk to your health care provider about how often you should have regular mammograms. Talk with your health care provider about your  test results, treatment options, and if necessary, the need for more tests. Vaccines  Your health care provider may recommend certain vaccines, such as:  Influenza vaccine. This is recommended every year.  Tetanus, diphtheria, and acellular pertussis (Tdap, Td) vaccine.  You may need a Td booster every 10 years.  Zoster vaccine. You may need this after age 78.  Pneumococcal 13-valent conjugate (PCV13) vaccine. One dose is recommended after age 18.  Pneumococcal polysaccharide (PPSV23) vaccine. One dose is recommended after age 73. Talk to your health care provider about which screenings and vaccines you need and how often you need them. This information is not intended to replace advice given to you by your health care provider. Make sure you discuss any questions you have with your health care provider. Document Released: 09/23/2015 Document Revised: 05/16/2016 Document Reviewed: 06/28/2015 Elsevier Interactive Patient Education  2017 [place] Prevention in the Home Falls can cause injuries. They can happen to people of all ages. There are many things you can do to make your home safe and to help prevent falls. What can I do on the outside of my home?  Regularly fix the edges of walkways and driveways and fix any cracks.  Remove anything that might make you trip as you walk through a door, such as a raised step or threshold.  Trim any bushes or trees on the path to your home.  Use bright outdoor lighting.  Clear any walking paths of anything that might make someone trip, such as rocks or tools.  Regularly check to see if handrails are loose or broken. Make sure that both sides of any steps have handrails.  Any raised decks and porches should have guardrails on the edges.  Have any leaves, snow, or ice cleared regularly.  Use sand or salt on walking paths during winter.  Clean up any spills in your garage right away. This includes oil or grease spills. What can I do in the bathroom?  Use night lights.  Install grab bars by the toilet and in the tub and shower. Do not use towel bars as grab bars.  Use non-skid mats or decals in the tub or shower.  If you need to sit down in the shower, use a plastic, non-slip stool.  Keep the  floor dry. Clean up any water that spills on the floor as soon as it happens.  Remove soap buildup in the tub or shower regularly.  Attach bath mats securely with double-sided non-slip rug tape.  Do not have throw rugs and other things on the floor that can make you trip. What can I do in the bedroom?  Use night lights.  Make sure that you have a light by your bed that is easy to reach.  Do not use any sheets or blankets that are too big for your bed. They should not hang down onto the floor.  Have a firm chair that has side arms. You can use this for support while you get dressed.  Do not have throw rugs and other things on the floor that can make you trip. What can I do in the kitchen?  Clean up any spills right away.  Avoid walking on wet floors.  Keep items that you use a lot in easy-to-reach places.  If you need to reach something above you, use a strong step stool that has a grab bar.  Keep electrical cords out of the way.  Do not use floor polish or wax that  makes floors slippery. If you must use wax, use non-skid floor wax.  Do not have throw rugs and other things on the floor that can make you trip. What can I do with my stairs?  Do not leave any items on the stairs.  Make sure that there are handrails on both sides of the stairs and use them. Fix handrails that are broken or loose. Make sure that handrails are as long as the stairways.  Check any carpeting to make sure that it is firmly attached to the stairs. Fix any carpet that is loose or worn.  Avoid having throw rugs at the top or bottom of the stairs. If you do have throw rugs, attach them to the floor with carpet tape.  Make sure that you have a light switch at the top of the stairs and the bottom of the stairs. If you do not have them, ask someone to add them for you. What else can I do to help prevent falls?  Wear shoes that:  Do not have high heels.  Have rubber bottoms.  Are comfortable and fit  you well.  Are closed at the toe. Do not wear sandals.  If you use a stepladder:  Make sure that it is fully opened. Do not climb a closed stepladder.  Make sure that both sides of the stepladder are locked into place.  Ask someone to hold it for you, if possible.  Clearly mark and make sure that you can see:  Any grab bars or handrails.  First and last steps.  Where the edge of each step is.  Use tools that help you move around (mobility aids) if they are needed. These include:  Canes.  Walkers.  Scooters.  Crutches.  Turn on the lights when you go into a dark area. Replace any light bulbs as soon as they burn out.  Set up your furniture so you have a clear path. Avoid moving your furniture around.  If any of your floors are uneven, fix them.  If there are any pets around you, be aware of where they are.  Review your medicines with your doctor. Some medicines can make you feel dizzy. This can increase your chance of falling. Ask your doctor what other things that you can do to help prevent falls. This information is not intended to replace advice given to you by your health care provider. Make sure you discuss any questions you have with your health care provider. Document Released: 06/23/2009 Document Revised: 02/02/2016 Document Reviewed: 10/01/2014 Elsevier Interactive Patient Education  2017 [employer].

## 2017-04-01 NOTE — Progress Notes (Signed)
Subjective:   Caroline Conway is a 76 y.o. female who presents for Medicare Annual (Subsequent) preventive examination.  Review of Systems:  Cardiac Risk Factors include: dyslipidemia;hypertension;advanced age (>54men, >10 women)     Objective:     Vitals: BP (!) 150/84 (BP Location: Left Arm, Patient Position: Sitting)   Pulse 78   Temp 98.3 F (36.8 C)   Resp 16   Ht 5' (1.524 m)   Wt 156 lb (70.8 kg)   BMI 30.47 kg/m   Body mass index is 30.47 kg/m.   Tobacco History  Smoking Status  . Never Smoker  Smokeless Tobacco  . Never Used     Counseling given: Not Answered   Past Medical History:  Diagnosis Date  . Cancer ([place])    skin ca  . Collagen vascular disease (HCC)    RA  . H/O total hysterectomy   . Heartburn   . HLD (hyperlipidemia)   . Hypertension   . Neuropathy   . Rheumatoid arteritis    Past Surgical History:  Procedure Laterality Date  . ANTERIOR AND POSTERIOR VAGINAL REPAIR  04/2015  . APPENDECTOMY  1960  . BREAST CYST ASPIRATION Left    lt fna- neg  . [place] SURGERY  2011  . riight shoulder surgery Right 1999  . SHOULDER SURGERY Left 2006  . TOTAL KNEE ARTHROPLASTY Left 2011  . VAGINAL HYSTERECTOMY  04/2015   Family History  Problem Relation Age of Onset  . Alzheimer's disease Mother   . Lung cancer Father   . Stomach cancer Brother   . Prostate cancer Neg Hx   . Kidney cancer Neg Hx   . Bladder Cancer Neg Hx   . Breast cancer Neg Hx    History  Sexual Activity  . Sexual activity: No    Outpatient Encounter Prescriptions as of 04/01/2017  Medication Sig  . acyclovir (ZOVIRAX) 200 MG capsule Take 1 capsule (200 mg total) by mouth 2 (two) times daily.  Marland Kitchen atenolol (TENORMIN) 50 MG tablet TAKE 1 TABLET BY MOUTH DAILY  . DULoxetine (CYMBALTA) 20 MG capsule Take by mouth.  . esomeprazole (NEXIUM) 20 MG capsule Take 1 capsule by mouth daily.  . folic acid (FOLVITE) 1 MG tablet Take 2 tablets by mouth daily.   Marland Kitchen gabapentin  (NEURONTIN) 100 MG capsule TAKE 1 TABLET IN AM AND 2 TABLETS AT NIGHT  . hydrochlorothiazide (HYDRODIURIL) 25 MG tablet Take 2 tablets (50 mg total) by mouth daily. (Patient taking differently: Take 25 mg by mouth daily. )  . meloxicam (MOBIC) 15 MG tablet Take 1 tablet by mouth daily.  . methotrexate 2.5 MG tablet Take 8 tablets by mouth once a week.  . mirabegron ER (MYRBETRIQ) 50 MG TB24 tablet Take 1 tablet (50 mg total) by mouth daily.  . mirtazapine (REMERON) 30 MG tablet Take 1 tablet (30 mg total) by mouth at bedtime.  Marland Kitchen PREMARIN vaginal cream INSERT 1 APPLICATORFUL VAGINALLY AT BEDTIME  . simvastatin (ZOCOR) 20 MG tablet Take 1 tablet (20 mg total) by mouth daily.  Marland Kitchen trimethoprim (TRIMPEX) 100 MG tablet Take 1 tablet (100 mg total) by mouth daily.  Marland Kitchen zolpidem (AMBIEN) 5 MG tablet TAKE 1 TABLET BY MOUTH AT BEDTIME (Patient not taking: Reported on 04/01/2017)   No facility-administered encounter medications on file as of 04/01/2017.     Activities of Daily Living In your present state of health, do you have any difficulty performing the following activities: 04/01/2017 05/01/2016  Hearing?  Y N  Vision? N N  Difficulty concentrating or making decisions? Y N  Walking or climbing stairs? Y N  Dressing or bathing? N N  Doing errands, shopping? N N  Preparing Food and eating ? N -  Using the Toilet? N -  In the past six months, have you accidently leaked urine? Y -  Do you have problems with loss of bowel control? Y -  Managing your Medications? N -  Managing your Finances? N -  Housekeeping or managing your Housekeeping? N -  Some recent data might be hidden    Patient Care Team: Glean Hess, MD as PCP - General (Internal Medicine) Vladimir Crofts, MD (Neurology) Blima Rich, MD as Referring Physician (Obstetrics and Gynecology) Reche Dixon, PA-C (Orthopedic Surgery) Margaretha Sheffield, MD (Otolaryngology) Valinda Party, MD (Rheumatology)    Assessment:     Exercise  Activities and Dietary recommendations Current Exercise Habits: Structured exercise class, Type of exercise: strength training/weights, Time (Minutes): 60, Frequency (Times/Week): 4, Weekly Exercise (Minutes/Week): 240, Intensity: Mild, Exercise limited by: None identified  Goals    . Increase water intake          Recommend drinking at least 5-6 glasses of water a day       Fall Risk Fall Risk  04/01/2017 05/01/2016 05/12/2015  Falls in the past year? Yes No Yes  Number falls in past yr: 2 or more - 2 or more  Injury with Fall? No - Yes  Risk Factor Category  High Fall Risk - -  Follow up Falls prevention discussed - -   Depression Screen PHQ 2/9 Scores 04/01/2017 05/01/2016 05/12/2015  PHQ - 2 Score 1 0 0     Cognitive Function     6CIT Screen 04/01/2017  What Year? 0 points  What month? 0 points  What time? 0 points  Count back from 20 0 points  Months in reverse 0 points  Repeat phrase 0 points  Total Score 0    Immunization History  Administered Date(s) Administered  . Influenza,inj,Quad PF,36+ Mos 07/07/2015  . Tdap 05/31/2011   Screening Tests Health Maintenance  Topic Date Due  . INFLUENZA VACCINE  04/10/2017  . MAMMOGRAM  10/15/2017  . COLONOSCOPY  03/10/2020  . TETANUS/TDAP  05/30/2021  . DEXA SCAN  Completed  . PNA vac Low Risk Adult  Completed      Plan:     I have personally reviewed and addressed the Medicare Annual Wellness questionnaire and have noted the following in the patient's chart:  A. Medical and social history B. Use of alcohol, tobacco or illicit drugs  C. Current medications and supplements D. Functional ability and status E.  Nutritional status F.  Physical activity G. Advance directives H. List of other physicians I.  Hospitalizations, surgeries, and ER visits in previous 12 months J.  [place] such as hearing and vision if needed, cognitive and depression L. Referrals and appointments   In addition, I have reviewed  and discussed with patient certain preventive protocols, quality metrics, and best practice recommendations. A written personalized care plan for preventive services as well as general preventive health recommendations were provided to patient.   Signed,  Tyler Aas, LPN Nurse Health Advisor   MD Recommendations: none

## 2017-04-08 ENCOUNTER — Encounter: Payer: Self-pay | Admitting: Urology

## 2017-04-08 ENCOUNTER — Ambulatory Visit: Payer: PPO | Admitting: Urology

## 2017-04-08 VITALS — BP 100/56 | HR 62 | Ht 60.0 in | Wt 154.1 lb

## 2017-04-08 DIAGNOSIS — N390 Urinary tract infection, site not specified: Secondary | ICD-10-CM | POA: Diagnosis not present

## 2017-04-08 DIAGNOSIS — N3946 Mixed incontinence: Secondary | ICD-10-CM | POA: Diagnosis not present

## 2017-04-08 LAB — URINALYSIS, COMPLETE
Bilirubin, UA: NEGATIVE
GLUCOSE, UA: NEGATIVE
KETONES UA: NEGATIVE
Leukocytes, UA: NEGATIVE
NITRITE UA: NEGATIVE
PH UA: 7 (ref 5.0–7.5)
Protein, UA: NEGATIVE
SPEC GRAV UA: 1.01 (ref 1.005–1.030)
Urobilinogen, Ur: 0.2 mg/dL (ref 0.2–1.0)

## 2017-04-08 LAB — MICROSCOPIC EXAMINATION

## 2017-04-08 MED ORDER — NITROFURANTOIN MONOHYD MACRO 100 MG PO CAPS: 100.0000 mg | ORAL_CAPSULE | Freq: Every day | ORAL | 11 refills | Status: DC

## 2017-04-08 NOTE — Progress Notes (Signed)
04/08/2017 3:52 PM   [name] 1941/06/13 [id number]  Referring provider: Glean Hess, [place] [address] [place], [place] [address]  No chief complaint on file.   HPI: The patient has mixed incontinence and mild frequency and nocturia. She has chronic cystitis. I think prophylaxis will be a good treatment option for her. It'll be interesting see if some of her incontinence down regulates. She thinks the beta 3 agonist does help. I will repeat baseline her voiding dysfunction and goals in the future. Pathophysiology of chronic cystitis discussed. The patient will be started on trimethoprim 100 mg of 30 tablets and 11 refills and I will reassess her in 6 weeks  The patient has urge incontinence worse than her stress incontinence and rare bedwetting. She had failed oxybutynin and Vesicare in the past  Frequency improved. Urgency incontinence improved. Clinically noninfected but the urine was sent for culture. She wears 3 pads a day and many of them are damp and her baseline was 6 pads per day with more severe leakage  Today Last visit she was doing better on trimethoprim and the beta 3 agonist. I did not think the dry eye was due to the medication. Frequency is stable.   The patient describes to breakthrough infections and she self treat her self once with Keflex. I will send a urine for culture today. I last saw her in March and she did have a positive urine culture in April. She will continue to get cultures  She does say the beta 3 agonist reduces her incontinence but also her frequency and urgency. She paced $40 a month but soon will go into a very expensive donut hole until the end of the year  She understands that there is not a generic beta 3 agonist and she has failed 2 other antimuscarinics. 2 months the samples with a repeat appointment before the end of the year was discussed     PMH: Past Medical History:  Diagnosis Date  . Cancer ([place])    skin ca    . Collagen vascular disease (HCC)    RA  . H/O total hysterectomy   . Heartburn   . HLD (hyperlipidemia)   . Hypertension   . Neuropathy   . Rheumatoid arteritis     Surgical History: Past Surgical History:  Procedure Laterality Date  . ANTERIOR AND POSTERIOR VAGINAL REPAIR  04/2015  . APPENDECTOMY  1960  . BREAST CYST ASPIRATION Left    lt fna- neg  . [place] SURGERY  2011  . riight shoulder surgery Right 1999  . SHOULDER SURGERY Left 2006  . TOTAL KNEE ARTHROPLASTY Left 2011  . VAGINAL HYSTERECTOMY  04/2015    Home Medications:  Allergies as of 04/08/2017      Reactions   Codeine Sulfate Hives   Oxycodone Itching   Propoxyphene Hives   Penicillins Rash   Sulfa Antibiotics Rash      Medication List       Accurate as of 04/08/17  3:52 PM. Always use your most recent med list.          acyclovir 200 MG capsule Commonly known as:  ZOVIRAX Take 1 capsule (200 mg total) by mouth 2 (two) times daily.   atenolol 50 MG tablet Commonly known as:  TENORMIN TAKE 1 TABLET BY MOUTH DAILY   DULoxetine 20 MG capsule Commonly known as:  CYMBALTA Take by mouth.   esomeprazole 20 MG capsule Commonly known as:  NEXIUM Take  1 capsule by mouth daily.   folic acid 1 MG tablet Commonly known as:  FOLVITE Take 2 tablets by mouth daily.   gabapentin 100 MG capsule Commonly known as:  NEURONTIN TAKE 1 TABLET IN AM AND 2 TABLETS AT NIGHT   hydrochlorothiazide 25 MG tablet Commonly known as:  HYDRODIURIL Take 2 tablets (50 mg total) by mouth daily.   meloxicam 15 MG tablet Commonly known as:  MOBIC Take 1 tablet by mouth daily.   methotrexate 2.5 MG tablet Take 8 tablets by mouth once a week.   mirabegron ER 50 MG Tb24 tablet Commonly known as:  MYRBETRIQ Take 1 tablet (50 mg total) by mouth daily.   mirtazapine 30 MG tablet Commonly known as:  REMERON Take 1 tablet (30 mg total) by mouth at bedtime.   PREMARIN vaginal cream Generic drug:  conjugated  estrogens INSERT 1 APPLICATORFUL VAGINALLY AT BEDTIME   simvastatin 20 MG tablet Commonly known as:  ZOCOR Take 1 tablet (20 mg total) by mouth daily.   trimethoprim 100 MG tablet Commonly known as:  TRIMPEX Take 1 tablet (100 mg total) by mouth daily.   zolpidem 5 MG tablet Commonly known as:  AMBIEN TAKE 1 TABLET BY MOUTH AT BEDTIME       Allergies:  Allergies  Allergen Reactions  . Codeine Sulfate Hives  . Oxycodone Itching  . Propoxyphene Hives  . Penicillins Rash  . Sulfa Antibiotics Rash    Family History: Family History  Problem Relation Age of Onset  . Alzheimer's disease Mother   . Lung cancer Father   . Stomach cancer Brother   . Prostate cancer Neg Hx   . Kidney cancer Neg Hx   . Bladder Cancer Neg Hx   . Breast cancer Neg Hx     Social History:  reports that she has never smoked. She has never used smokeless tobacco. She reports that she does not drink alcohol or use drugs.  ROS:                                        Physical Exam: There were no vitals taken for this visit.  Constitutional:  Alert and oriented, No acute distress.   Laboratory Data: Lab Results  Component Value Date   WBC 5.7 03/19/2017   HGB 11.6 03/19/2017   HCT 34.7 03/19/2017   MCV 94 03/19/2017   PLT 276 03/19/2017    Lab Results  Component Value Date   CREATININE 1.00 03/19/2017    No results found for: PSA  No results found for: TESTOSTERONE  No results found for: HGBA1C  Urinalysis    Component Value Date/Time   COLORURINE YELLOW 08/17/2016 1104   APPEARANCEUR Hazy (A) 12/28/2016 1440   LABSPEC 1.015 08/17/2016 1104   LABSPEC 1.015 03/01/2013 1325   PHURINE 7.5 08/17/2016 1104   GLUCOSEU Negative 12/28/2016 1440   GLUCOSEU NEGATIVE 03/01/2013 1325   HGBUR MODERATE (A) 08/17/2016 1104   BILIRUBINUR Negative 12/28/2016 1440   BILIRUBINUR NEGATIVE 03/01/2013 1325   KETONESUR NEGATIVE 08/17/2016 1104   PROTEINUR Trace  12/28/2016 1440   PROTEINUR NEGATIVE 08/17/2016 1104   UROBILINOGEN 0.2 08/08/2016 1457   NITRITE Negative 12/28/2016 1440   NITRITE NEGATIVE 08/17/2016 1104   LEUKOCYTESUR 3+ (A) 12/28/2016 1440   LEUKOCYTESUR 3+ 03/01/2013 1325    Pertinent Imaging:   Assessment & Plan:  The patient was given 2  months of samples. I will see her in about 2 months for more samples. I will call the cultures positive. I switched her to daily Macrodantin and prescription was sent  There are no diagnoses linked to this encounter.  No Follow-up on file.  Reece Packer, MD  [facility] [address] [place], [place] [address] [phone]

## 2017-04-10 LAB — CULTURE, URINE COMPREHENSIVE

## 2017-05-02 ENCOUNTER — Other Ambulatory Visit: Payer: Self-pay | Admitting: Internal Medicine

## 2017-05-02 DIAGNOSIS — E785 Hyperlipidemia, unspecified: Secondary | ICD-10-CM

## 2017-05-03 ENCOUNTER — Institutional Professional Consult (permissible substitution): Payer: PPO | Admitting: Internal Medicine

## 2017-05-03 ENCOUNTER — Encounter: Payer: Self-pay | Admitting: Pulmonary Disease

## 2017-05-03 ENCOUNTER — Ambulatory Visit (INDEPENDENT_AMBULATORY_CARE_PROVIDER_SITE_OTHER): Payer: PPO | Admitting: Pulmonary Disease

## 2017-05-03 ENCOUNTER — Other Ambulatory Visit: Payer: Self-pay | Admitting: Internal Medicine

## 2017-05-03 VITALS — BP 142/82 | HR 60 | Ht 61.0 in | Wt 153.0 lb

## 2017-05-03 DIAGNOSIS — J984 Other disorders of lung: Secondary | ICD-10-CM

## 2017-05-03 DIAGNOSIS — K219 Gastro-esophageal reflux disease without esophagitis: Secondary | ICD-10-CM

## 2017-05-03 DIAGNOSIS — Z7722 Contact with and (suspected) exposure to environmental tobacco smoke (acute) (chronic): Secondary | ICD-10-CM | POA: Diagnosis not present

## 2017-05-03 DIAGNOSIS — E785 Hyperlipidemia, unspecified: Secondary | ICD-10-CM

## 2017-05-03 DIAGNOSIS — R05 Cough: Secondary | ICD-10-CM

## 2017-05-03 DIAGNOSIS — R053 Chronic cough: Secondary | ICD-10-CM

## 2017-05-03 MED ORDER — FAMOTIDINE 20 MG PO TABS: 20.0000 mg | ORAL_TABLET | Freq: Every day | ORAL | 5 refills | Status: DC

## 2017-05-03 NOTE — Patient Instructions (Signed)
Continue Nexium Add Pepcid (famotidine) 20 mg one hour prior to bedtime Follow-up in 3-4 weeks with lung function tests prior to that visit

## 2017-05-04 NOTE — Progress Notes (Signed)
PULMONARY CONSULT NOTE  Requesting MD/Service: [employer] Date of initial consultation: 05/03/17 Reason for consultation: Chronic cough  PT PROFILE: 76 y.o. female never smoker with extensive exposure to second hand smoke referred for evaluation of chronic cough of 2 yrs duration  HPI:  As above. She reports persistent cough often coming in paroxysms, day and night, productive of thick "mucus". She has seen Dr Ayesha Mohair (ENT) who told her she has GERD. She has been treated with a PPI without improvement. She has undergone a recent CXR with report possible chronic interstitial lung disease. She does have RA for which she is on methotrexate. She denies CP, fever, purulent sputum, hemoptysis, LE edema and calf tenderness.   She has never been on an ACEI but has recently been started on an ARB. She has not had any change in her cough since starting this medication.    Past Medical History:  Diagnosis Date  . Cancer ([place])    skin ca  . Collagen vascular disease (HCC)    RA  . H/O total hysterectomy   . Heartburn   . HLD (hyperlipidemia)   . Hypertension   . Neuropathy   . Rheumatoid arteritis     Past Surgical History:  Procedure Laterality Date  . ANTERIOR AND POSTERIOR VAGINAL REPAIR  04/2015  . APPENDECTOMY  1960  . BREAST CYST ASPIRATION Left    lt fna- neg  . [place] SURGERY  2011  . riight shoulder surgery Right 1999  . SHOULDER SURGERY Left 2006  . TOTAL KNEE ARTHROPLASTY Left 2011  . VAGINAL HYSTERECTOMY  04/2015    MEDICATIONS: I have reviewed all medications and confirmed regimen as documented  Social History   Social History  . Marital status: Widowed    Spouse name: N/A  . Number of children: N/A  . Years of education: N/A   Occupational History  . Not on file.   Social History Main Topics  . Smoking status: Never Smoker  . Smokeless tobacco: Never Used  . Alcohol use No  . Drug use: No  . Sexual activity: No   Other Topics Concern  . Not on file    Social History Narrative  . No narrative on file    Family History  Problem Relation Age of Onset  . Alzheimer's disease Mother   . Lung cancer Father   . Stomach cancer Brother   . Prostate cancer Neg Hx   . Kidney cancer Neg Hx   . Bladder Cancer Neg Hx   . Breast cancer Neg Hx     ROS: No fever, myalgias, unexplained weight loss or weight gain No new focal weakness or sensory deficits No otalgia, hearing loss, visual changes, nasal and sinus symptoms, mouth and throat problems No neck pain or adenopathy No abdominal pain, N/V/D, diarrhea, change in bowel pattern No dysuria, change in urinary pattern   Vitals:   05/03/17 1040 05/03/17 1047  BP:  (!) 142/82  Pulse:  60  SpO2:  98%  Weight: 153 lb (69.4 kg)   Height: 5\' 1"  (1.549 m)      EXAM:  Gen: Obese, No overt respiratory distress HEENT: NCAT, sclera white, oropharynx normal, mild rhinitis  Neck: Supple without LAN, thyromegaly, JVD Lungs: breath sounds full with minimal bibasilar crackles, no wheezes  Cardiovascular: RRR, I/VI systolic M Abdomen: Soft, nontender, normal BS Ext: extensive LE varicosities, trace symmetric ankle edema Neuro: CNs grossly intact, motor and sensory intact Skin: Limited exam, no lesions noted  DATA:  BMP Latest Ref Rng & Units 03/19/2017 05/31/2016 08/12/2015  Glucose 65 - 99 mg/dL 100(H) 96 -  BUN 8 - 27 mg/dL 22 19 -  Creatinine 0.57 - 1.00 mg/dL 1.00 0.81 0.89  BUN/Creat Ratio 12 - 28 22 - -  Sodium 134 - 144 mmol/L 139 128(L) -  Potassium 3.5 - 5.2 mmol/L 4.7 3.4(L) -  Chloride 96 - 106 mmol/L 100 94(L) -  CO2 20 - 29 mmol/L 23 27 -  Calcium 8.7 - 10.3 mg/dL 9.6 8.8(L) -    CBC Latest Ref Rng & Units 03/19/2017 07/15/2014 04/13/2010  WBC 3.4 - 10.8 x10E3/uL 5.7 - 5.2  Hemoglobin 11.1 - 15.9 g/dL 11.6 12.5 11.7(L)  Hematocrit 34.0 - 46.6 % 34.7 37 35.8(L)  Platelets 150 - 379 x10E3/uL 276 - 299    CXR 03/19/17:  Moderate kyphosis, few linear scars in L>R  bases  IMPRESSION:     ICD-10-CM   1. Chronic cough R05 Pulmonary Function Test [facility] Only  2. Gastroesophageal reflux disease, esophagitis presence not specified K21.9   3. Secondhand smoke exposure Z77.22 Pulmonary Function Test [facility] Only  4. Scarring of lung J98.4    She appears to have chronic bronchitis - likely due to significant prolonged second hand smoke exposure  The bibasilar scarring is a nonspecific pattern and does not appear consistent with MTX toxicity or rheumatoid lung  Treatment of GERD with esomeprazole has not impacted on her cough    PLAN:  Continue Nexium Add famotidine 20 mg q HS Follow uo in 3-4 weeks with full PFTs  Merton Border, MD PCCM service Mobile [phone] Pager [phone] 05/04/2017 1:09 PM

## 2017-05-14 ENCOUNTER — Ambulatory Visit (INDEPENDENT_AMBULATORY_CARE_PROVIDER_SITE_OTHER): Payer: PPO | Admitting: Internal Medicine

## 2017-05-14 ENCOUNTER — Other Ambulatory Visit: Payer: Self-pay | Admitting: Internal Medicine

## 2017-05-14 VITALS — BP 104/68 | HR 55 | Ht 60.0 in | Wt 152.6 lb

## 2017-05-14 DIAGNOSIS — N3001 Acute cystitis with hematuria: Secondary | ICD-10-CM

## 2017-05-14 LAB — POCT URINALYSIS DIPSTICK
Bilirubin, UA: NEGATIVE
Glucose, UA: NEGATIVE
KETONES UA: NEGATIVE
Nitrite, UA: POSITIVE
PH UA: 6.5 (ref 5.0–8.0)
PROTEIN UA: NEGATIVE
SPEC GRAV UA: 1.015 (ref 1.010–1.025)
Urobilinogen, UA: 0.2 E.U./dL

## 2017-05-14 MED ORDER — CIPROFLOXACIN HCL 250 MG PO TABS: 250.0000 mg | ORAL_TABLET | Freq: Two times a day (BID) | ORAL | 0 refills | Status: DC

## 2017-05-14 NOTE — Progress Notes (Signed)
Date:  05/14/2017   Name:  Caroline Conway   DOB:  Dec 06, 1940   MRN:  [id number]   Chief Complaint: Urinary Tract Infection (Symptoms are urgency, and cannot urnate much. Cloudy and smelly. Feeling under the weather. Had to get up 6 times last night to urinate. Couldn't get in to see Urologist.  ) Urinary Tract Infection   This is a recurrent problem. The quality of the pain is described as burning. The pain is mild. There has been no fever. Associated symptoms include chills, frequency and urgency. Pertinent negatives include no hematuria. She has tried antibiotics (on trimethiprim daily prophy started a month ago by Urology) for the symptoms. Her past medical history is significant for recurrent UTIs.    Review of Systems  Constitutional: Positive for chills. Negative for fatigue and fever.  Respiratory: Negative for chest tightness and shortness of breath.   Cardiovascular: Negative for chest pain.  Genitourinary: Positive for dysuria, frequency and urgency. Negative for hematuria.    Patient Active Problem List   Diagnosis Date Noted  . Abnormal CXR 03/21/2017  . Chronic renal insufficiency, stage 3 (moderate) 03/19/2017  . Venous insufficiency of both lower extremities 02/13/2017  . Herpes simplex infection 07/07/2015  . Anxiety disorder 01/29/2015  . Atrophic vaginitis 01/29/2015  . Dyslipidemia 01/29/2015  . Essential (primary) hypertension 01/29/2015  . Acid reflux 01/29/2015  . Depression, major, single episode, in partial remission ([place]) 01/29/2015  . Idiopathic peripheral neuropathy 01/29/2015  . Idiopathic insomnia 01/29/2015  . Rheumatoid arthritis involving multiple joints ([place]) 01/29/2015  . Urge incontinence 01/29/2015  . Pelvic relaxation due to vaginal prolapse 01/29/2015    Prior to Admission medications   Medication Sig Start Date End Date Taking? Authorizing Provider  acyclovir (ZOVIRAX) 200 MG capsule Take 1 capsule (200 mg total) by mouth 2 (two) times  daily. 12/04/16   Glean Hess, MD  atenolol (TENORMIN) 50 MG tablet TAKE 1 TABLET BY MOUTH DAILY 07/16/16   Glean Hess, MD  DULoxetine (CYMBALTA) 20 MG capsule Take by mouth. 06/22/16 06/22/17  [provider]  esomeprazole (NEXIUM) 20 MG capsule Take 1 capsule by mouth daily.    [provider]  famotidine (PEPCID) 20 MG tablet Take 1 tablet (20 mg total) by mouth at bedtime. 05/03/17 05/03/18  Wilhelmina Mcardle, MD  folic acid (FOLVITE) 1 MG tablet Take 2 tablets by mouth daily.     [provider]  gabapentin (NEURONTIN) 100 MG capsule TAKE 1 TABLET IN AM AND 2 TABLETS AT NIGHT 12/18/16   [provider]  hydrochlorothiazide (HYDRODIURIL) 25 MG tablet Take 2 tablets (50 mg total) by mouth daily. Patient taking differently: Take 25 mg by mouth daily.  05/01/16   Glean Hess, MD  meloxicam (MOBIC) 15 MG tablet Take 1 tablet by mouth daily.    [provider]  methotrexate 2.5 MG tablet Take 8 tablets by mouth once a week.    [provider]  mirabegron ER (MYRBETRIQ) 50 MG TB24 tablet Take 1 tablet (50 mg total) by mouth daily. 09/11/16   McGowan, Hunt Oris, PA-C  nitrofurantoin, macrocrystal-monohydrate, (MACROBID) 100 MG capsule Take 1 capsule (100 mg total) by mouth daily. 04/08/17   Bjorn Loser, MD  PREMARIN vaginal cream INSERT 1 APPLICATORFUL VAGINALLY AT BEDTIME 12/13/15   Glean Hess, MD  simvastatin (ZOCOR) 20 MG tablet TAKE 1 TABLET (20 MG TOTAL) BY MOUTH DAILY. 05/02/17   Glean Hess, MD  simvastatin Ferdinand Cava)  20 MG tablet TAKE 1 TABLET (20 MG TOTAL) BY MOUTH DAILY. 05/03/17   Glean Hess, MD  trimethoprim (TRIMPEX) 100 MG tablet Take 1 tablet (100 mg total) by mouth daily. 10/03/16   Bjorn Loser, MD  zolpidem (AMBIEN) 5 MG tablet TAKE 1 TABLET BY MOUTH AT BEDTIME 11/30/16   Glean Hess, MD    Allergies  Allergen Reactions  . Codeine Sulfate Hives  . Oxycodone Itching  . Propoxyphene Hives    . Mirtazapine Swelling  . Penicillins Rash  . Sulfa Antibiotics Rash    Past Surgical History:  Procedure Laterality Date  . ANTERIOR AND POSTERIOR VAGINAL REPAIR  04/2015  . APPENDECTOMY  1960  . BREAST CYST ASPIRATION Left    lt fna- neg  . [place] SURGERY  2011  . riight shoulder surgery Right 1999  . SHOULDER SURGERY Left 2006  . TOTAL KNEE ARTHROPLASTY Left 2011  . VAGINAL HYSTERECTOMY  04/2015    Social History  Substance Use Topics  . Smoking status: Never Smoker  . Smokeless tobacco: Never Used  . Alcohol use No     Medication list has been reviewed and updated.  PHQ 2/9 Scores 04/01/2017 05/01/2016 05/12/2015  PHQ - 2 Score 1 0 0    Physical Exam  Constitutional: She appears well-developed and well-nourished.  Cardiovascular: Normal rate, regular rhythm and normal heart sounds.   Pulmonary/Chest: Effort normal and breath sounds normal. No respiratory distress.  Abdominal: Soft. Bowel sounds are normal. There is no tenderness. There is no rebound, no guarding and no CVA tenderness.  Musculoskeletal: She exhibits no edema or tenderness.  Psychiatric: She has a normal mood and affect.  Nursing note and vitals reviewed.   BP 104/68   Pulse (!) 55   Ht 5' (1.524 m)   Wt 152 lb 9.6 oz (69.2 kg)   SpO2 99%   BMI 29.80 kg/m   Assessment and Plan: 1. Acute cystitis with hematuria Send for culture and start Cipro - POCT urinalysis dipstick - Urine Culture - ciprofloxacin (CIPRO) 250 MG tablet; Take 1 tablet (250 mg total) by mouth 2 (two) times daily.  Dispense: 20 tablet; Refill: 0   Meds ordered this encounter  Medications  . ciprofloxacin (CIPRO) 250 MG tablet    Sig: Take 1 tablet (250 mg total) by mouth 2 (two) times daily.    Dispense:  20 tablet    Refill:  0    Partially dictated using Editor, commissioning. Any errors are unintentional.  Halina Maidens, MD [facility]  05/14/2017

## 2017-05-16 LAB — URINE CULTURE

## 2017-05-22 ENCOUNTER — Ambulatory Visit: Payer: PPO

## 2017-05-30 ENCOUNTER — Ambulatory Visit: Payer: PPO | Attending: Pulmonary Disease

## 2017-05-30 DIAGNOSIS — R053 Chronic cough: Secondary | ICD-10-CM

## 2017-05-30 DIAGNOSIS — R05 Cough: Secondary | ICD-10-CM | POA: Diagnosis not present

## 2017-05-30 DIAGNOSIS — Z7722 Contact with and (suspected) exposure to environmental tobacco smoke (acute) (chronic): Secondary | ICD-10-CM | POA: Diagnosis not present

## 2017-06-03 ENCOUNTER — Encounter: Payer: Self-pay | Admitting: Pulmonary Disease

## 2017-06-03 ENCOUNTER — Ambulatory Visit (INDEPENDENT_AMBULATORY_CARE_PROVIDER_SITE_OTHER): Payer: PPO | Admitting: Pulmonary Disease

## 2017-06-03 VITALS — BP 100/60 | HR 68 | Resp 16 | Ht 60.0 in | Wt 153.0 lb

## 2017-06-03 DIAGNOSIS — M0510 Rheumatoid lung disease with rheumatoid arthritis of unspecified site: Secondary | ICD-10-CM | POA: Diagnosis not present

## 2017-06-03 DIAGNOSIS — J8410 Pulmonary fibrosis, unspecified: Secondary | ICD-10-CM | POA: Diagnosis not present

## 2017-06-03 DIAGNOSIS — R05 Cough: Secondary | ICD-10-CM | POA: Diagnosis not present

## 2017-06-03 DIAGNOSIS — J42 Unspecified chronic bronchitis: Secondary | ICD-10-CM | POA: Diagnosis not present

## 2017-06-03 DIAGNOSIS — R053 Chronic cough: Secondary | ICD-10-CM

## 2017-06-03 MED ORDER — BECLOMETHASONE DIPROPIONATE 40 MCG/ACT IN AERS: 2.0000 | INHALATION_SPRAY | Freq: Two times a day (BID) | RESPIRATORY_TRACT | 0 refills | Status: DC

## 2017-06-03 NOTE — Progress Notes (Signed)
PULMONARY OFFICE FOLLOW UP NOTE  Requesting MD/Service: [employer] Date of initial consultation: 05/03/17 Reason for consultation: Chronic cough  PT PROFILE: 76 y.o. female never smoker with extensive exposure to second hand smoke referred for evaluation of chronic cough of 2 yrs duration  DATA: 03/19/17 CXR: Moderate kyphosis. Mild chronic interstitial prominence 05/30/17 PFTs: Normal spirometry, normal lung volumes, moderately reduced DLCO which corrects to normal for alveolar volume  SUBJ:  Overall, cough is unchanged to perhaps slightly better. She continues to have cough productive of clear mucus, predominantly at night and in the early morning hours. Otherwise her symptoms are unchanged. Denies chest pain, fever, purulent sputum, hemoptysis, lower extremity edema, calf tenderness.  Vitals:   06/03/17 0911 06/03/17 0914  BP:  100/60  Pulse:  68  Resp: 16   SpO2:  99%  Weight: 69.4 kg (153 lb)   Height: 5' (1.524 m)      EXAM:  Gen: NAD HEENT: WNL Neck: No JVD, no LE and Lungs: breath sounds full with coarse bibasilar crackles, no wheezes  Cardiovascular: RRR, I/VI systolic M Abdomen: Soft, nontender, normal BS Ext: extensive LE varicosities, no edema Neuro: No focal deficits  DATA:   BMP Latest Ref Rng & Units 03/19/2017 05/31/2016 08/12/2015  Glucose 65 - 99 mg/dL 100(H) 96 -  BUN 8 - 27 mg/dL 22 19 -  Creatinine 0.57 - 1.00 mg/dL 1.00 0.81 0.89  BUN/Creat Ratio 12 - 28 22 - -  Sodium 134 - 144 mmol/L 139 128(L) -  Potassium 3.5 - 5.2 mmol/L 4.7 3.4(L) -  Chloride 96 - 106 mmol/L 100 94(L) -  CO2 20 - 29 mmol/L 23 27 -  Calcium 8.7 - 10.3 mg/dL 9.6 8.8(L) -    CBC Latest Ref Rng & Units 03/19/2017 07/15/2014 04/13/2010  WBC 3.4 - 10.8 x10E3/uL 5.7 - 5.2  Hemoglobin 11.1 - 15.9 g/dL 11.6 12.5 11.7(L)  Hematocrit 34.0 - 46.6 % 34.7 37 35.8(L)  Platelets 150 - 379 x10E3/uL 276 - 299    CXR:  No new film. Previous x-rays reviewed  IMPRESSION:     ICD-10-CM   1.  Chronic cough R05   2. Pulmonary fibrosis ([place]) J84.10 DG Chest 2 View  3. Rheumatoid lung ([place]) M05.10 DG Chest 2 View  4. Chronic bronchitis - likely due to long-term secondhand smoke exposure J42    Her previous x-rays and exam suggest a component of mild pulmonary fibrosis, likely rheumatoid lung. It is possible that this is the cause of her chronic cough. However she does describe chronic mucus production suggestive of bronchitis. Again noted is a long-term exposure to secondhand smoke.   PLAN:  - Continue Nexium and Pepcid as previously prescribed - Trial of Qvar inhaler - 2 inhalations twice a day. One month sample has been provided. If she feels that this has benefited her cough, she is to call here and we will enter a prescription. - Follow-up in 6 months with repeat chest x-ray to follow pulmonary fibrosis  Merton Border, MD PCCM service Mobile [phone] Pager [phone] 06/03/2017 1:16 PM

## 2017-06-03 NOTE — Patient Instructions (Signed)
Continue Nexium and Pepcid as previously prescribed  Trial of Qvar inhaler - 2 inhalations twice a day. Rinse mouth after use. Sample provided. If you feel that this helps her cough, call here and we will refill the medication.  Follow-up in 6 months with repeat chest x-ray

## 2017-06-06 ENCOUNTER — Other Ambulatory Visit: Payer: Self-pay | Admitting: Internal Medicine

## 2017-06-06 DIAGNOSIS — I10 Essential (primary) hypertension: Secondary | ICD-10-CM

## 2017-06-10 ENCOUNTER — Encounter: Payer: Self-pay | Admitting: Urology

## 2017-06-10 ENCOUNTER — Ambulatory Visit: Payer: PPO | Admitting: Urology

## 2017-06-10 VITALS — BP 126/75 | HR 57 | Ht 60.0 in | Wt 154.4 lb

## 2017-06-10 DIAGNOSIS — N3946 Mixed incontinence: Secondary | ICD-10-CM

## 2017-06-10 NOTE — Progress Notes (Signed)
06/10/2017 1:54 PM   Caroline Conway 07-09-1941 [id number]  Referring provider: Glean Hess, MD [address] [place], [place] [address]  Chief Complaint  Patient presents with  . Recurrent UTI    HPI:  The patient has mixed incontinence and mild frequency and nocturia. She has chronic cystitis. I think prophylaxis will be a good treatment option for her. It'll be interesting see if some of her incontinence down regulates. She thinks the beta 3 agonist does help. I will repeat baseline her voiding dysfunction and goals in the future.  The patient has urge incontinence worse than her stress incontinence and rare bedwetting. She had failed oxybutynin and Vesicare in the past  She wears 3 pads a day and many of them are damp and her baseline was 6 pads per day with more severe leakage I did not think the dry eye was due to the medication. Frequency is stable.   The patient describes to breakthrough infections and she self treat her self once with Keflex. I will send a urine for culture today. I last saw her in March and she did have a positive urine culture in April. She will continue to get cultures  She does say the beta 3 agonist reduces her incontinence but also her frequency and urgency. She paced $40 a month but soon will go into a very expensive donut hole until the end of the year  She understands that there is not a generic beta 3 agonist and she has failed 2 other antimuscarinics. 2 months the samples with a repeat appointment before the end of the year was discussed   Today In July on her last visit the culture was negative. In September she had a positive culture. Clinically noninfected today. Urgency and frequency much better on beta 3 agonist   PMH: Past Medical History:  Diagnosis Date  . Cancer ([place])    skin ca  . Collagen vascular disease (HCC)    RA  . H/O total hysterectomy   . Heartburn   . HLD (hyperlipidemia)   . Hypertension   .  Neuropathy   . Rheumatoid arteritis     Surgical History: Past Surgical History:  Procedure Laterality Date  . ANTERIOR AND POSTERIOR VAGINAL REPAIR  04/2015  . APPENDECTOMY  1960  . BREAST CYST ASPIRATION Left    lt fna- neg  . [place] SURGERY  2011  . riight shoulder surgery Right 1999  . SHOULDER SURGERY Left 2006  . TOTAL KNEE ARTHROPLASTY Left 2011  . VAGINAL HYSTERECTOMY  04/2015    Home Medications:  Allergies as of 06/10/2017      Reactions   Codeine Sulfate Hives   Oxycodone Itching   Propoxyphene Hives   Mirtazapine Swelling   Penicillins Rash   Sulfa Antibiotics Rash      Medication List       Accurate as of 06/10/17  1:54 PM. Always use your most recent med list.          acyclovir 200 MG capsule Commonly known as:  ZOVIRAX Take 1 capsule (200 mg total) by mouth 2 (two) times daily.   atenolol 50 MG tablet Commonly known as:  TENORMIN TAKE 1 TABLET BY MOUTH DAILY   beclomethasone 40 MCG/ACT inhaler Commonly known as:  QVAR Inhale 2 puffs into the lungs 2 (two) times daily.   Biotin 5000 MCG Caps Take by mouth.   DULoxetine 20 MG capsule Commonly known as:  CYMBALTA Take by  mouth.   esomeprazole 20 MG capsule Commonly known as:  NEXIUM Take 1 capsule by mouth daily.   famotidine 20 MG tablet Commonly known as:  PEPCID Take 1 tablet (20 mg total) by mouth at bedtime.   folic acid 1 MG tablet Commonly known as:  FOLVITE Take 2 tablets by mouth daily.   gabapentin 100 MG capsule Commonly known as:  NEURONTIN TAKE 1 TABLET IN AM AND 2 TABLETS AT NIGHT   hydrochlorothiazide 25 MG tablet Commonly known as:  HYDRODIURIL TAKE 2 TABLETS (50 MG TOTAL) BY MOUTH DAILY.   meloxicam 15 MG tablet Commonly known as:  MOBIC Take 1 tablet by mouth daily.   methotrexate 2.5 MG tablet Take 8 tablets by mouth once a week.   mirabegron ER 50 MG Tb24 tablet Commonly known as:  MYRBETRIQ Take 1 tablet (50 mg total) by mouth daily.   PREMARIN  vaginal cream Generic drug:  conjugated estrogens INSERT 1 APPLICATORFUL VAGINALLY AT BEDTIME   simvastatin 20 MG tablet Commonly known as:  ZOCOR TAKE 1 TABLET (20 MG TOTAL) BY MOUTH DAILY.   trimethoprim 100 MG tablet Commonly known as:  TRIMPEX Take 1 tablet (100 mg total) by mouth daily.   zolpidem 5 MG tablet Commonly known as:  AMBIEN TAKE 1 TABLET BY MOUTH AT BEDTIME       Allergies:  Allergies  Allergen Reactions  . Codeine Sulfate Hives  . Oxycodone Itching  . Propoxyphene Hives  . Mirtazapine Swelling  . Penicillins Rash  . Sulfa Antibiotics Rash    Family History: Family History  Problem Relation Age of Onset  . Alzheimer's disease Mother   . Lung cancer Father   . Stomach cancer Brother   . Prostate cancer Neg Hx   . Kidney cancer Neg Hx   . Bladder Cancer Neg Hx   . Breast cancer Neg Hx     Social History:  reports that she has never smoked. She has never used smokeless tobacco. She reports that she does not drink alcohol or use drugs.  ROS: UROLOGY Frequent Urination?: No Hard to postpone urination?: No Burning/pain with urination?: No Get up at night to urinate?: No Leakage of urine?: No Urine stream starts and stops?: No Trouble starting stream?: No Do you have to strain to urinate?: No Blood in urine?: No Urinary tract infection?: Yes Sexually transmitted disease?: No Injury to kidneys or bladder?: No Painful intercourse?: No Weak stream?: No Currently pregnant?: No Vaginal bleeding?: No Last menstrual period?: n  Gastrointestinal Nausea?: No Vomiting?: No Indigestion/heartburn?: No Diarrhea?: No Constipation?: No  Constitutional Fever: No Night sweats?: No Weight loss?: No Fatigue?: No  Skin Skin rash/lesions?: No Itching?: No  Eyes Blurred vision?: No Double vision?: No  Ears/Nose/Throat Sore throat?: No Sinus problems?: No  Hematologic/Lymphatic Swollen glands?: No Easy bruising?: No  Cardiovascular Leg  swelling?: No Chest pain?: No  Respiratory Cough?: No Shortness of breath?: No  Endocrine Excessive thirst?: No  Musculoskeletal Back pain?: No Joint pain?: No  Neurological Headaches?: No Dizziness?: No  Psychologic Depression?: No Anxiety?: No  Physical Exam: BP 126/75 (BP Location: Right Arm, Patient Position: Sitting, Cuff Size: Normal)   Pulse (!) 57   Ht 5' (1.524 m)   Wt 154 lb 6.4 oz (70 kg)   BMI 30.15 kg/m   Constitutional:  Alert and oriented, No acute distress. HEENT: [place] AT, moist mucus membranes.  Trachea midline, no masses.   Laboratory Data: Lab Results  Component Value Date  WBC 5.7 03/19/2017   HGB 11.6 03/19/2017   HCT 34.7 03/19/2017   MCV 94 03/19/2017   PLT 276 03/19/2017    Lab Results  Component Value Date   CREATININE 1.00 03/19/2017    No results found for: PSA  No results found for: TESTOSTERONE  No results found for: HGBA1C  Urinalysis    Component Value Date/Time   COLORURINE YELLOW 08/17/2016 1104   APPEARANCEUR Clear 04/08/2017 1610   LABSPEC 1.015 08/17/2016 1104   LABSPEC 1.015 03/01/2013 1325   PHURINE 7.5 08/17/2016 1104   GLUCOSEU Negative 04/08/2017 1610   GLUCOSEU NEGATIVE 03/01/2013 1325   HGBUR MODERATE (A) 08/17/2016 1104   BILIRUBINUR neg 05/14/2017 1530   BILIRUBINUR Negative 04/08/2017 1610   BILIRUBINUR NEGATIVE 03/01/2013 1325   KETONESUR NEGATIVE 08/17/2016 1104   PROTEINUR neg 05/14/2017 1530   PROTEINUR Negative 04/08/2017 1610   PROTEINUR NEGATIVE 08/17/2016 1104   UROBILINOGEN 0.2 05/14/2017 1530   NITRITE positive 05/14/2017 1530   NITRITE Negative 04/08/2017 1610   NITRITE NEGATIVE 08/17/2016 1104   LEUKOCYTESUR Large (3+) (A) 05/14/2017 1530   LEUKOCYTESUR Negative 04/08/2017 1610   LEUKOCYTESUR 3+ 03/01/2013 1325    Pertinent Imaging: None  Assessment & Plan:  The patient had a breakthrough on Macrodantin and it caused loose bowel movements. She does better on the beta 3 agonist.  She currently went back on trimethoprim and we decided to stay on it even know it is not been perfect. She understands a daily Keflex as an option and she's had Keflex short-term in the past. We decided not to use it yet. She is somewhat frustrated and I can empathize with her. Again pathophysiology was discussed 2 more times. Treatment tools were discussed. I did not prescribe local estrogen cream. See her in 6 months. Repeated twice that the beta 3 agonist does not cause infection  There are no diagnoses linked to this encounter.  No Follow-up on file.  Reece Packer, MD  [facility] [address] [place], [place] [address] [phone]

## 2017-06-27 DIAGNOSIS — M810 Age-related osteoporosis without current pathological fracture: Secondary | ICD-10-CM | POA: Diagnosis not present

## 2017-06-27 DIAGNOSIS — M159 Polyosteoarthritis, unspecified: Secondary | ICD-10-CM | POA: Diagnosis not present

## 2017-06-27 DIAGNOSIS — Z79899 Other long term (current) drug therapy: Secondary | ICD-10-CM | POA: Diagnosis not present

## 2017-06-27 DIAGNOSIS — M0579 Rheumatoid arthritis with rheumatoid factor of multiple sites without organ or systems involvement: Secondary | ICD-10-CM | POA: Diagnosis not present

## 2017-06-29 ENCOUNTER — Other Ambulatory Visit: Payer: Self-pay | Admitting: Internal Medicine

## 2017-07-01 NOTE — Telephone Encounter (Signed)
Patient is schedule for cpe in May, she also states she no longer need the (Mirtazapine).

## 2017-07-03 ENCOUNTER — Other Ambulatory Visit: Payer: Self-pay | Admitting: Internal Medicine

## 2017-07-18 ENCOUNTER — Other Ambulatory Visit: Payer: Self-pay | Admitting: Internal Medicine

## 2017-07-24 DIAGNOSIS — H6123 Impacted cerumen, bilateral: Secondary | ICD-10-CM | POA: Diagnosis not present

## 2017-07-24 DIAGNOSIS — J3489 Other specified disorders of nose and nasal sinuses: Secondary | ICD-10-CM | POA: Diagnosis not present

## 2017-07-24 DIAGNOSIS — J342 Deviated nasal septum: Secondary | ICD-10-CM | POA: Diagnosis not present

## 2017-07-24 DIAGNOSIS — J3502 Chronic adenoiditis: Secondary | ICD-10-CM | POA: Diagnosis not present

## 2017-07-25 DIAGNOSIS — H1859 Other hereditary corneal dystrophies: Secondary | ICD-10-CM | POA: Diagnosis not present

## 2017-08-07 DIAGNOSIS — J342 Deviated nasal septum: Secondary | ICD-10-CM | POA: Diagnosis not present

## 2017-08-07 DIAGNOSIS — J3489 Other specified disorders of nose and nasal sinuses: Secondary | ICD-10-CM | POA: Diagnosis not present

## 2017-08-07 DIAGNOSIS — J3502 Chronic adenoiditis: Secondary | ICD-10-CM | POA: Diagnosis not present

## 2017-08-07 DIAGNOSIS — K219 Gastro-esophageal reflux disease without esophagitis: Secondary | ICD-10-CM | POA: Diagnosis not present

## 2017-08-14 DIAGNOSIS — F5104 Psychophysiologic insomnia: Secondary | ICD-10-CM | POA: Diagnosis not present

## 2017-08-14 DIAGNOSIS — R296 Repeated falls: Secondary | ICD-10-CM | POA: Diagnosis not present

## 2017-08-14 DIAGNOSIS — R2689 Other abnormalities of gait and mobility: Secondary | ICD-10-CM | POA: Diagnosis not present

## 2017-08-14 DIAGNOSIS — G609 Hereditary and idiopathic neuropathy, unspecified: Secondary | ICD-10-CM | POA: Diagnosis not present

## 2017-08-22 ENCOUNTER — Other Ambulatory Visit: Payer: Self-pay | Admitting: Internal Medicine

## 2017-08-22 DIAGNOSIS — F5101 Primary insomnia: Secondary | ICD-10-CM

## 2017-09-11 ENCOUNTER — Ambulatory Visit (INDEPENDENT_AMBULATORY_CARE_PROVIDER_SITE_OTHER): Payer: PPO | Admitting: Internal Medicine

## 2017-09-11 ENCOUNTER — Encounter: Payer: Self-pay | Admitting: Internal Medicine

## 2017-09-11 VITALS — BP 118/78 | HR 78 | Temp 98.2°F | Ht 60.0 in | Wt 154.0 lb

## 2017-09-11 DIAGNOSIS — F5101 Primary insomnia: Secondary | ICD-10-CM

## 2017-09-11 DIAGNOSIS — J40 Bronchitis, not specified as acute or chronic: Secondary | ICD-10-CM

## 2017-09-11 MED ORDER — DOXYCYCLINE HYCLATE 100 MG PO TABS: 100.0000 mg | ORAL_TABLET | Freq: Two times a day (BID) | ORAL | 0 refills | Status: DC

## 2017-09-11 MED ORDER — BENZONATATE 100 MG PO CAPS: 100.0000 mg | ORAL_CAPSULE | Freq: Three times a day (TID) | ORAL | 0 refills | Status: DC

## 2017-09-11 MED ORDER — TRAZODONE HCL 50 MG PO TABS: 50.0000 mg | ORAL_TABLET | Freq: Every day | ORAL | 5 refills | Status: DC

## 2017-09-11 NOTE — Patient Instructions (Signed)
Drink lots of fluids Continue Mucinex twice a day

## 2017-09-11 NOTE — Progress Notes (Signed)
Date:  09/11/2017   Name:  Caroline Conway   DOB:  1941/05/11   MRN:  [id number]   Chief Complaint: URI (Been sick since 09/01/2017. Coughing up thick mucous. Wheezing at night. Feel like stuff is stuff in throat and chest. No chills or fever. Not sleeping well due to on and off hot/cold flashes. Diarrhea since last week. Everytime coughing having diarrhea spill. )  URI   This is a new problem. The current episode started 1 to 4 weeks ago. The problem has been gradually worsening. There has been no fever. Associated symptoms include congestion, coughing, diarrhea and wheezing. Pertinent negatives include no chest pain, headaches, sore throat or vomiting.   Diarrhea - having incontinence with cough.  Worse over the past few weeks since taking antibiotics from Dr. Ayesha Mohair for tonsil infection.  She denies blood in the stool.  No vomiting.  Insomnia - remeron caused her legs to swell and Lorrin Mais is of no benefit.  She would like to try something else to help.    Review of Systems  Constitutional: Positive for fatigue. Negative for chills and fever.  HENT: Positive for congestion and postnasal drip. Negative for sore throat and trouble swallowing.   Respiratory: Positive for cough and wheezing. Negative for choking, chest tightness and shortness of breath.   Cardiovascular: Negative for chest pain, palpitations and leg swelling.  Gastrointestinal: Positive for diarrhea. Negative for blood in stool and vomiting.  Neurological: Negative for dizziness and headaches.  Psychiatric/Behavioral: Positive for sleep disturbance. Negative for dysphoric mood.    Patient Active Problem List   Diagnosis Date Noted  . Abnormal CXR 03/21/2017  . Chronic renal insufficiency, stage 3 (moderate) ([place]) 03/19/2017  . Venous insufficiency of both lower extremities 02/13/2017  . Herpes simplex infection 07/07/2015  . Anxiety disorder 01/29/2015  . Atrophic vaginitis 01/29/2015  . Dyslipidemia 01/29/2015  .  Essential (primary) hypertension 01/29/2015  . Acid reflux 01/29/2015  . Depression, major, single episode, in partial remission ([place]) 01/29/2015  . Idiopathic peripheral neuropathy 01/29/2015  . Idiopathic insomnia 01/29/2015  . Rheumatoid arthritis involving multiple joints ([place]) 01/29/2015  . Urge incontinence 01/29/2015  . Pelvic relaxation due to vaginal prolapse 01/29/2015    Prior to Admission medications   Medication Sig Start Date End Date Taking? Authorizing Provider  acyclovir (ZOVIRAX) 200 MG capsule TAKE 1 CAPSULE (200 MG TOTAL) BY MOUTH 2 (TWO) TIMES DAILY. 07/18/17  Yes Glean Hess, MD  atenolol (TENORMIN) 50 MG tablet TAKE 1 TABLET BY MOUTH DAILY 07/16/16  Yes Glean Hess, MD  Biotin 5000 MCG CAPS Take by mouth.   Yes [provider]  esomeprazole (NEXIUM) 20 MG capsule Take 1 capsule by mouth daily.   Yes [provider]  famotidine (PEPCID) 20 MG tablet Take 1 tablet (20 mg total) by mouth at bedtime. 05/03/17 05/03/18 Yes Wilhelmina Mcardle, MD  folic acid (FOLVITE) 1 MG tablet Take 2 tablets by mouth daily.    Yes [provider]  hydrochlorothiazide (HYDRODIURIL) 25 MG tablet TAKE 2 TABLETS (50 MG TOTAL) BY MOUTH DAILY. 06/06/17  Yes Glean Hess, MD  methotrexate 2.5 MG tablet Take 8 tablets by mouth once a week.   Yes [provider]  PREMARIN vaginal cream INSERT 1 APPLICATORFUL VAGINALLY AT BEDTIME 07/03/17  Yes Glean Hess, MD  simvastatin (ZOCOR) 20 MG tablet TAKE 1 TABLET (20 MG TOTAL) BY MOUTH DAILY. 05/02/17  Yes Glean Hess, MD  trimethoprim (TRIMPEX) 100  MG tablet Take 1 tablet (100 mg total) by mouth daily. 10/03/16  Yes MacDiarmid, Nicki Reaper, MD  zolpidem (AMBIEN) 5 MG tablet TAKE 1 TABLET BY MOUTH AT BEDTIME 08/22/17  Yes Glean Hess, MD    Allergies  Allergen Reactions  . Codeine Sulfate Hives  . Oxycodone Itching  . Propoxyphene Hives  . Mirtazapine Swelling  . Penicillins Rash  . Sulfa  Antibiotics Rash    Past Surgical History:  Procedure Laterality Date  . ANTERIOR AND POSTERIOR VAGINAL REPAIR  04/2015  . APPENDECTOMY  1960  . BREAST CYST ASPIRATION Left    lt fna- neg  . [place] SURGERY  2011  . riight shoulder surgery Right 1999  . SHOULDER SURGERY Left 2006  . TOTAL KNEE ARTHROPLASTY Left 2011  . VAGINAL HYSTERECTOMY  04/2015    Social History   Tobacco Use  . Smoking status: Never Smoker  . Smokeless tobacco: Never Used  Substance Use Topics  . Alcohol use: No    Alcohol/week: 0.0 oz  . Drug use: No     Medication list has been reviewed and updated.  PHQ 2/9 Scores 09/11/2017 04/01/2017 05/01/2016 05/12/2015  PHQ - 2 Score 0 1 0 0  PHQ- 9 Score 0 - - -    Physical Exam  Constitutional: She is oriented to person, place, and time. She appears well-developed and well-nourished. She has a sickly appearance. No distress.  HENT:  Head: Normocephalic and atraumatic.  Right Ear: Tympanic membrane and ear canal normal.  Left Ear: Tympanic membrane and ear canal normal.  Nose: Right sinus exhibits no maxillary sinus tenderness. Left sinus exhibits no maxillary sinus tenderness.  Mouth/Throat: Posterior oropharyngeal erythema present. No posterior oropharyngeal edema.  Neck: No thyromegaly present.  Cardiovascular: Normal rate, regular rhythm and normal heart sounds.  Pulmonary/Chest: Effort normal. No respiratory distress. She has decreased breath sounds. She has wheezes. She has no rhonchi. She has no rales.  Musculoskeletal: Normal range of motion.  Neurological: She is alert and oriented to person, place, and time.  Skin: Skin is warm and dry. No rash noted.  Psychiatric: She has a normal mood and affect. Her behavior is normal. Thought content normal.  Nursing note and vitals reviewed.   BP 118/78   Pulse 78   Temp 98.2 F (36.8 C) (Oral)   Ht 5' (1.524 m)   Wt 154 lb (69.9 kg)   SpO2 96%   BMI 30.08 kg/m   Assessment and Plan: 1.  Bronchitis Increase fluids Take Mucinex bid Follow up if diarrhea persists for further evaluation - benzonatate (TESSALON) 100 MG capsule; Take 1 capsule (100 mg total) by mouth 3 (three) times daily.  Dispense: 20 capsule; Refill: 0 - doxycycline (VIBRA-TABS) 100 MG tablet; Take 1 tablet (100 mg total) by mouth 2 (two) times daily.  Dispense: 20 tablet; Refill: 0  2. Idiopathic insomnia - traZODone (DESYREL) 50 MG tablet; Take 1 tablet (50 mg total) by mouth at bedtime.  Dispense: 30 tablet; Refill: 5   Meds ordered this encounter  Medications  . benzonatate (TESSALON) 100 MG capsule    Sig: Take 1 capsule (100 mg total) by mouth 3 (three) times daily.    Dispense:  20 capsule    Refill:  0  . doxycycline (VIBRA-TABS) 100 MG tablet    Sig: Take 1 tablet (100 mg total) by mouth 2 (two) times daily.    Dispense:  20 tablet    Refill:  0  . traZODone (DESYREL)  50 MG tablet    Sig: Take 1 tablet (50 mg total) by mouth at bedtime.    Dispense:  30 tablet    Refill:  5    Partially dictated using Editor, commissioning. Any errors are unintentional.  Halina Maidens, MD [facility]  09/11/2017

## 2017-09-12 ENCOUNTER — Other Ambulatory Visit: Payer: Self-pay | Admitting: Internal Medicine

## 2017-09-12 ENCOUNTER — Telehealth: Payer: Self-pay

## 2017-09-12 MED ORDER — LEVOFLOXACIN 500 MG PO TABS: 500.0000 mg | ORAL_TABLET | Freq: Every day | ORAL | 0 refills | Status: AC

## 2017-09-12 NOTE — Telephone Encounter (Signed)
Patient was seen in office yesterday- was given doxycycline for treatment. States its causing her to have severe headaches. She cannot take this and wants to try a different antibiotic. She stated she has had levofloxacin in the past and its helped with no side affects.   Please Advise.

## 2017-09-12 NOTE — Telephone Encounter (Signed)
Levofloxin sent to [facility].

## 2017-09-12 NOTE — Telephone Encounter (Signed)
Patient informed. 

## 2017-09-16 ENCOUNTER — Ambulatory Visit: Payer: PPO | Admitting: Gastroenterology

## 2017-09-16 ENCOUNTER — Other Ambulatory Visit: Payer: Self-pay | Admitting: Internal Medicine

## 2017-09-26 DIAGNOSIS — J40 Bronchitis, not specified as acute or chronic: Secondary | ICD-10-CM | POA: Diagnosis not present

## 2017-09-26 DIAGNOSIS — M159 Polyosteoarthritis, unspecified: Secondary | ICD-10-CM | POA: Diagnosis not present

## 2017-09-26 DIAGNOSIS — M0579 Rheumatoid arthritis with rheumatoid factor of multiple sites without organ or systems involvement: Secondary | ICD-10-CM | POA: Diagnosis not present

## 2017-09-26 DIAGNOSIS — M810 Age-related osteoporosis without current pathological fracture: Secondary | ICD-10-CM | POA: Diagnosis not present

## 2017-09-26 DIAGNOSIS — Z79899 Other long term (current) drug therapy: Secondary | ICD-10-CM | POA: Diagnosis not present

## 2017-09-30 ENCOUNTER — Encounter (INDEPENDENT_AMBULATORY_CARE_PROVIDER_SITE_OTHER): Payer: Self-pay

## 2017-09-30 ENCOUNTER — Other Ambulatory Visit: Payer: Self-pay

## 2017-09-30 ENCOUNTER — Encounter: Payer: Self-pay | Admitting: Gastroenterology

## 2017-09-30 ENCOUNTER — Ambulatory Visit: Payer: PPO | Admitting: Gastroenterology

## 2017-09-30 VITALS — BP 130/65 | HR 62 | Ht 60.0 in | Wt 149.5 lb

## 2017-09-30 DIAGNOSIS — K219 Gastro-esophageal reflux disease without esophagitis: Secondary | ICD-10-CM | POA: Diagnosis not present

## 2017-09-30 DIAGNOSIS — R1310 Dysphagia, unspecified: Secondary | ICD-10-CM

## 2017-09-30 DIAGNOSIS — R194 Change in bowel habit: Secondary | ICD-10-CM | POA: Diagnosis not present

## 2017-09-30 NOTE — Progress Notes (Signed)
Gastroenterology Consultation  Referring Provider:     Margaretha Sheffield, MD Primary Care Physician:  Glean Hess, MD Primary Gastroenterologist:  Dr. Allen Norris     Reason for Consultation:     Dysphagia        HPI:   Caroline Conway is a 77 y.o. y/o female referred for consultation & management of dysphagia by Dr. Army Melia, Jesse Sans, MD.  This patient comes in today with a history of dysphagia. The patient reports that she had an EGD and colonoscopy about 10 years ago with dilation of her esophagus. The patient also states that she was seen by ENT who reported that she had changes on her exam consistent with reflux. The patient states that when she lays down at night she has had acid coming up in her mouth that she was started on Pepcid by her pulmonologist and she states that she does not have the acid anymore but she continues to have mucus, up into her mouth while she's sleeping. There is no report of any unexplained weight loss. There is report of the patient having some food getting stuck when she eats. She is now taking Nexium in the morning and her Pepcid at night. The patient also reports that she has had a change in bowel habits with her movements to be somewhat smaller volumes with pellet like passage of stool  Past Medical History:  Diagnosis Date  . Cancer ([place])    skin ca  . Collagen vascular disease ([facility])    RA  . H/O total hysterectomy   . Heartburn   . HLD (hyperlipidemia)   . Hypertension   . Neuropathy   . Rheumatoid arteritis     Past Surgical History:  Procedure Laterality Date  . ANTERIOR AND POSTERIOR VAGINAL REPAIR  04/2015  . APPENDECTOMY  1960  . BREAST CYST ASPIRATION Left    lt fna- neg  . [place] SURGERY  2011  . riight shoulder surgery Right 1999  . SHOULDER SURGERY Left 2006  . TOTAL KNEE ARTHROPLASTY Left 2011  . VAGINAL HYSTERECTOMY  04/2015    Prior to Admission medications   Medication Sig Start Date End Date Taking? Authorizing Provider    acyclovir (ZOVIRAX) 200 MG capsule TAKE 1 CAPSULE (200 MG TOTAL) BY MOUTH 2 (TWO) TIMES DAILY. 07/18/17  Yes Glean Hess, MD  atenolol (TENORMIN) 50 MG tablet TAKE 1 TABLET BY MOUTH DAILY 07/16/16  Yes Glean Hess, MD  Biotin 5000 MCG CAPS Take by mouth.   Yes [provider]  esomeprazole (NEXIUM) 20 MG capsule Take 1 capsule by mouth daily.   Yes [provider]  famotidine (PEPCID) 20 MG tablet Take 1 tablet (20 mg total) by mouth at bedtime. 05/03/17 05/03/18 Yes Wilhelmina Mcardle, MD  folic acid (FOLVITE) 1 MG tablet Take 2 tablets by mouth daily.    Yes [provider]  gabapentin (NEURONTIN) 100 MG capsule TAKE 1 TABLET IN AM AND 2 TABLETS AT NIGHT 06/28/17  Yes [provider]  hydrochlorothiazide (HYDRODIURIL) 25 MG tablet TAKE 2 TABLETS (50 MG TOTAL) BY MOUTH DAILY. 06/06/17  Yes Glean Hess, MD  methotrexate 2.5 MG tablet Take 8 tablets by mouth once a week.   Yes [provider]  PREMARIN vaginal cream INSERT 1 APPLICATORFUL VAGINALLY AT BEDTIME 07/03/17  Yes Glean Hess, MD  simvastatin (ZOCOR) 20 MG tablet TAKE 1 TABLET (20 MG TOTAL) BY MOUTH DAILY. 05/02/17  Yes Halina Maidens  H, MD  trimethoprim (TRIMPEX) 100 MG tablet Take 1 tablet (100 mg total) by mouth daily. 10/03/16  Yes MacDiarmid, Nicki Reaper, MD  zolpidem (AMBIEN) 5 MG tablet TAKE 1 TABLET BY MOUTH AT BEDTIME 08/22/17  Yes Glean Hess, MD  traZODone (DESYREL) 50 MG tablet Take 1 tablet (50 mg total) by mouth at bedtime. Patient not taking: Reported on 09/30/2017 09/11/17   Glean Hess, MD    Family History  Problem Relation Age of Onset  . Alzheimer's disease Mother   . Lung cancer Father   . Stomach cancer Brother   . Prostate cancer Neg Hx   . Kidney cancer Neg Hx   . Bladder Cancer Neg Hx   . Breast cancer Neg Hx      Social History   Tobacco Use  . Smoking status: Never Smoker  . Smokeless tobacco: Never Used  Substance Use Topics  .  Alcohol use: No    Alcohol/week: 0.0 oz  . Drug use: No    Allergies as of 09/30/2017 - Review Complete 09/30/2017  Allergen Reaction Noted  . Codeine sulfate Hives 03/21/2015  . Oxycodone Itching 03/21/2015  . Propoxyphene Hives 07/07/2015  . Doxycycline Other (See Comments) 09/16/2017  . Mirtazapine Swelling 05/14/2017  . Penicillins Rash 01/29/2015  . Sulfa antibiotics Rash 01/29/2015    Review of Systems:    All systems reviewed and negative except where noted in HPI.   Physical Exam:  BP 130/65   Pulse 62   Ht 5' (1.524 m)   Wt 149 lb 8 oz (67.8 kg)   BMI 29.20 kg/m  No LMP recorded. Patient has had a hysterectomy. Psych:  Alert and cooperative. Normal mood and affect. General:   Alert,  Well-developed, well-nourished, pleasant and cooperative in NAD Head:  Normocephalic and atraumatic. Eyes:  Sclera clear, no icterus.   Conjunctiva pink. Ears:  Normal auditory acuity. Nose:  No deformity, discharge, or lesions. Mouth:  No deformity or lesions,oropharynx pink & moist. Neck:  Supple; no masses or thyromegaly. Lungs:  Respirations even and unlabored.  Clear throughout to auscultation.   No wheezes, crackles, or rhonchi. No acute distress. Heart:  Regular rate and rhythm; no murmurs, clicks, rubs, or gallops. Abdomen:  Normal bowel sounds.  No bruits.  Soft, non-tender and non-distended without masses, hepatosplenomegaly or hernias noted.  No guarding or rebound tenderness.  Negative Carnett sign.   Rectal:  Deferred.  Msk:  Symmetrical without gross deformities.  Good, equal movement & strength bilaterally. Pulses:  Normal pulses noted. Extremities:  No clubbing or edema.  No cyanosis. Neurologic:  Alert and oriented x3;  grossly normal neurologically. Skin:  Intact without significant lesions or rashes.  No jaundice. Lymph Nodes:  No significant cervical adenopathy. Psych:  Alert and cooperative. Normal mood and affect.  Imaging Studies: No results  found.  Assessment and Plan:   Caroline Conway is a 77 y.o. y/o female who comes in with a history of needing esophageal dilation approximately 10 years ago and now has reflux symptoms with ENT findings consistent with reflux. The patient will switch her Nexium to the night time because of her symptoms being mostly at night. The patient will also stop the Pepcid. She will be set up for an EGD because of dysphagia and a colonoscopy due to her change in bowel habits. I have discussed risks & benefits which include, but are not limited to, bleeding, infection, perforation & drug reaction.  The patient agrees with this plan &  written consent will be obtained.     Lucilla Lame, MD. [facility]   Note: This dictation was prepared with Dragon dictation along with smaller phrase technology. Any transcriptional errors that result from this process are unintentional.

## 2017-10-01 ENCOUNTER — Other Ambulatory Visit: Payer: Self-pay

## 2017-10-01 DIAGNOSIS — I10 Essential (primary) hypertension: Secondary | ICD-10-CM

## 2017-10-01 MED ORDER — HYDROCHLOROTHIAZIDE 25 MG PO TABS: 50.0000 mg | ORAL_TABLET | Freq: Every day | ORAL | 4 refills | Status: DC

## 2017-10-02 ENCOUNTER — Other Ambulatory Visit: Payer: Self-pay | Admitting: Internal Medicine

## 2017-10-02 ENCOUNTER — Other Ambulatory Visit: Payer: Self-pay

## 2017-10-02 DIAGNOSIS — R1319 Other dysphagia: Secondary | ICD-10-CM

## 2017-10-02 DIAGNOSIS — R1310 Dysphagia, unspecified: Secondary | ICD-10-CM

## 2017-10-02 DIAGNOSIS — Z1231 Encounter for screening mammogram for malignant neoplasm of breast: Secondary | ICD-10-CM

## 2017-10-03 ENCOUNTER — Other Ambulatory Visit: Payer: Self-pay

## 2017-10-03 DIAGNOSIS — F5101 Primary insomnia: Secondary | ICD-10-CM

## 2017-10-03 MED ORDER — TRAZODONE HCL 50 MG PO TABS: 50.0000 mg | ORAL_TABLET | Freq: Every day | ORAL | 1 refills | Status: DC

## 2017-10-15 ENCOUNTER — Other Ambulatory Visit: Payer: Self-pay | Admitting: Internal Medicine

## 2017-10-28 ENCOUNTER — Ambulatory Visit
Admission: RE | Admit: 2017-10-28 | Discharge: 2017-10-28 | Disposition: A | Discharge status: Home / Self Care | Payer: PPO | Source: Ambulatory Visit | Attending: Internal Medicine | Admitting: Internal Medicine

## 2017-10-28 ENCOUNTER — Telehealth: Payer: Self-pay | Admitting: Gastroenterology

## 2017-10-28 DIAGNOSIS — Z1231 Encounter for screening mammogram for malignant neoplasm of breast: Secondary | ICD-10-CM | POA: Insufficient documentation

## 2017-10-28 NOTE — Telephone Encounter (Signed)
Pt has been advised that we have checked her insurance for prior authorization. I have also advised her pre-service center will contact her regarding her benefits and will inform her if a copay is required.

## 2017-10-28 NOTE — Discharge Instructions (Signed)
General Anesthesia, Adult, Care After °These instructions provide you with information about caring for yourself after your procedure. Your health care provider may also give you more specific instructions. Your treatment has been planned according to current medical practices, but problems sometimes occur. Call your health care provider if you have any problems or questions after your procedure. °What can I expect after the procedure? °After the procedure, it is common to have: °· Vomiting. °· A sore throat. °· Mental slowness. ° °It is common to feel: °· Nauseous. °· Cold or shivery. °· Sleepy. °· Tired. °· Sore or achy, even in parts of your body where you did not have surgery. ° °Follow these instructions at home: °For at least 24 hours after the procedure: °· Do not: °? Participate in activities where you could fall or become injured. °? Drive. °? Use heavy machinery. °? Drink alcohol. °? Take sleeping pills or medicines that cause drowsiness. °? Make important decisions or sign legal documents. °? Take care of children on your own. °· Rest. °Eating and drinking °· If you vomit, drink water, juice, or soup when you can drink without vomiting. °· Drink enough fluid to keep your urine clear or pale yellow. °· Make sure you have little or no nausea before eating solid foods. °· Follow the diet recommended by your health care provider. °General instructions °· Have a responsible adult stay with you until you are awake and alert. °· Return to your normal activities as told by your health care provider. Ask your health care provider what activities are safe for you. °· Take over-the-counter and prescription medicines only as told by your health care provider. °· If you smoke, do not smoke without supervision. °· Keep all follow-up visits as told by your health care provider. This is important. °Contact a health care provider if: °· You continue to have nausea or vomiting at home, and medicines are not helpful. °· You  cannot drink fluids or start eating again. °· You cannot urinate after 8-12 hours. °· You develop a skin rash. °· You have fever. °· You have increasing redness at the site of your procedure. °Get help right away if: °· You have difficulty breathing. °· You have chest pain. °· You have unexpected bleeding. °· You feel that you are having a life-threatening or urgent problem. °This information is not intended to replace advice given to you by your health care provider. Make sure you discuss any questions you have with your health care provider. °Document Released: 12/03/2000 Document Revised: 01/30/2016 Document Reviewed: 08/11/2015 °Elsevier Interactive Patient Education © 2018 [employer] ° °

## 2017-10-28 NOTE — Telephone Encounter (Signed)
Patient called & l/m to verify her insurance had been called about her upper & lower endoscopy scheduled for Friday with Dr Allen Norris.?Does she need to bring a co-pay with her to the hospital?

## 2017-10-30 ENCOUNTER — Other Ambulatory Visit: Payer: Self-pay | Admitting: Internal Medicine

## 2017-11-01 ENCOUNTER — Ambulatory Visit: Payer: PPO | Admitting: Anesthesiology

## 2017-11-01 ENCOUNTER — Ambulatory Visit
Admission: RE | Admit: 2017-11-01 | Discharge: 2017-11-01 | Disposition: A | Discharge status: Home / Self Care | Payer: PPO | Source: Ambulatory Visit | Attending: Gastroenterology | Admitting: Gastroenterology

## 2017-11-01 ENCOUNTER — Encounter
Admission: RE | Disposition: A | Discharge status: Home / Self Care | Payer: Self-pay | Source: Ambulatory Visit | Attending: Gastroenterology

## 2017-11-01 DIAGNOSIS — D123 Benign neoplasm of transverse colon: Secondary | ICD-10-CM

## 2017-11-01 DIAGNOSIS — M069 Rheumatoid arthritis, unspecified: Secondary | ICD-10-CM | POA: Insufficient documentation

## 2017-11-01 DIAGNOSIS — R1310 Dysphagia, unspecified: Secondary | ICD-10-CM

## 2017-11-01 DIAGNOSIS — R195 Other fecal abnormalities: Secondary | ICD-10-CM | POA: Diagnosis not present

## 2017-11-01 DIAGNOSIS — K635 Polyp of colon: Secondary | ICD-10-CM | POA: Diagnosis not present

## 2017-11-01 DIAGNOSIS — Z85828 Personal history of other malignant neoplasm of skin: Secondary | ICD-10-CM | POA: Diagnosis not present

## 2017-11-01 DIAGNOSIS — K222 Esophageal obstruction: Secondary | ICD-10-CM | POA: Diagnosis not present

## 2017-11-01 DIAGNOSIS — I10 Essential (primary) hypertension: Secondary | ICD-10-CM | POA: Insufficient documentation

## 2017-11-01 DIAGNOSIS — R1319 Other dysphagia: Secondary | ICD-10-CM

## 2017-11-01 DIAGNOSIS — E785 Hyperlipidemia, unspecified: Secondary | ICD-10-CM | POA: Insufficient documentation

## 2017-11-01 DIAGNOSIS — Z79899 Other long term (current) drug therapy: Secondary | ICD-10-CM | POA: Insufficient documentation

## 2017-11-01 DIAGNOSIS — K5730 Diverticulosis of large intestine without perforation or abscess without bleeding: Secondary | ICD-10-CM | POA: Diagnosis not present

## 2017-11-01 HISTORY — PX: POLYPECTOMY: SHX5525

## 2017-11-01 HISTORY — PX: ESOPHAGEAL DILATION: SHX303

## 2017-11-01 HISTORY — PX: COLONOSCOPY WITH PROPOFOL: SHX5780

## 2017-11-01 HISTORY — DX: Myoneural disorder, unspecified: G70.9

## 2017-11-01 HISTORY — DX: Dyspnea, unspecified: R06.00

## 2017-11-01 HISTORY — PX: ESOPHAGOGASTRODUODENOSCOPY (EGD) WITH PROPOFOL: SHX5813

## 2017-11-01 SURGERY — COLONOSCOPY WITH PROPOFOL
Anesthesia: General | Site: Throat | Wound class: Contaminated

## 2017-11-01 MED ORDER — LIDOCAINE HCL (CARDIAC) 20 MG/ML IV SOLN
INTRAVENOUS | Status: DC | PRN
  Administered 2017-11-01: 50 mg via INTRAVENOUS

## 2017-11-01 MED ORDER — GLYCOPYRROLATE 0.2 MG/ML IJ SOLN
INTRAMUSCULAR | Status: DC | PRN
  Administered 2017-11-01: 0.1 mg via INTRAVENOUS

## 2017-11-01 MED ORDER — EPHEDRINE SULFATE 50 MG/ML IJ SOLN
INTRAMUSCULAR | Status: DC | PRN
  Administered 2017-11-01: 10 mg via INTRAVENOUS

## 2017-11-01 MED ORDER — PROPOFOL 10 MG/ML IV BOLUS
INTRAVENOUS | Status: DC | PRN
  Administered 2017-11-01 (×2): 10 mg via INTRAVENOUS
  Administered 2017-11-01: 20 mg via INTRAVENOUS
  Administered 2017-11-01 (×2): 10 mg via INTRAVENOUS
  Administered 2017-11-01: 20 mg via INTRAVENOUS
  Administered 2017-11-01: 10 mg via INTRAVENOUS
  Administered 2017-11-01: 20 mg via INTRAVENOUS
  Administered 2017-11-01: 100 mg via INTRAVENOUS
  Administered 2017-11-01 (×2): 10 mg via INTRAVENOUS

## 2017-11-01 MED ORDER — ACETAMINOPHEN 160 MG/5ML PO SOLN: 325.0000 mg | Freq: Once | ORAL | Status: AC

## 2017-11-01 MED ORDER — DEXMEDETOMIDINE HCL 200 MCG/2ML IV SOLN
INTRAVENOUS | Status: DC | PRN
  Administered 2017-11-01: 6 ug via INTRAVENOUS

## 2017-11-01 MED ORDER — ACETAMINOPHEN 325 MG PO TABS
325.0000 mg | ORAL_TABLET | Freq: Once | ORAL | Status: AC
  Administered 2017-11-01: 650 mg via ORAL

## 2017-11-01 MED ORDER — STERILE WATER FOR IRRIGATION IR SOLN
Status: DC | PRN
  Administered 2017-11-01: .5 mL

## 2017-11-01 MED ORDER — LACTATED RINGERS IV SOLN
INTRAVENOUS | Status: DC
  Administered 2017-11-01: 07:00:00 via INTRAVENOUS

## 2017-11-01 MED ORDER — SODIUM CHLORIDE 0.9 % IV SOLN: INTRAVENOUS | Status: DC

## 2017-11-01 MED ORDER — LACTATED RINGERS IV SOLN: 10.0000 mL/h | INTRAVENOUS | Status: DC

## 2017-11-01 SURGICAL SUPPLY — 10 items
BALLN DILATOR 12-15 8 (BALLOONS) ×6
BALLOON DILATOR 12-15 8 (BALLOONS) IMPLANT
BLOCK BITE 60FR ADLT L/F GRN (MISCELLANEOUS) ×6 IMPLANT
CANISTER SUCT 1200ML W/VALVE (MISCELLANEOUS) ×6 IMPLANT
FORCEPS BIOP RAD 4 LRG CAP 4 (CUTTING FORCEPS) ×2 IMPLANT
GOWN CVR UNV OPN BCK APRN NK (MISCELLANEOUS) ×8 IMPLANT
GOWN ISOL THUMB LOOP REG UNIV (MISCELLANEOUS) ×12
KIT ENDO PROCEDURE OLY (KITS) ×6 IMPLANT
SYR INFLATION 60ML (SYRINGE) ×2 IMPLANT
WATER STERILE IRR 250ML POUR (IV SOLUTION) ×6 IMPLANT

## 2017-11-01 NOTE — Transfer of Care (Signed)
Immediate Anesthesia Transfer of Care Note  Patient: Caroline Conway  Procedure(s) Performed: COLONOSCOPY WITH PROPOFOL (N/A Rectum) ESOPHAGOGASTRODUODENOSCOPY (EGD) WITH PROPOFOL (N/A Throat) ESOPHAGEAL DILATION (Throat) POLYPECTOMY (Rectum)  Patient Location: PACU  Anesthesia Type: General  Level of Consciousness: awake, alert  and patient cooperative  Airway and Oxygen Therapy: Patient Spontanous Breathing and Patient connected to supplemental oxygen  Post-op Assessment: Post-op Vital signs reviewed, Patient's Cardiovascular Status Stable, Respiratory Function Stable, Patent Airway and No signs of Nausea or vomiting  Post-op Vital Signs: Reviewed and stable  Complications: No apparent anesthesia complications

## 2017-11-01 NOTE — Anesthesia Preprocedure Evaluation (Signed)
Anesthesia Evaluation  Patient identified by MRN, date of birth, ID band Patient awake    Reviewed: Allergy & Precautions, H&P , NPO status , Patient's Chart, lab work & pertinent test results  Airway Mallampati: II  TM Distance: >3 FB Neck ROM: full    Dental no notable dental hx.    Pulmonary shortness of breath,    Pulmonary exam normal breath sounds clear to auscultation       Cardiovascular hypertension, Normal cardiovascular exam Rhythm:regular Rate:Normal     Neuro/Psych PSYCHIATRIC DISORDERS    GI/Hepatic GERD  ,  Endo/Other    Renal/GU Renal disease     Musculoskeletal   Abdominal   Peds  Hematology   Anesthesia Other Findings   Reproductive/Obstetrics                             Anesthesia Physical Anesthesia Plan  ASA: II  Anesthesia Plan: General   Post-op Pain Management:    Induction: Intravenous  PONV Risk Score and Plan: 3 and Treatment may vary due to age or medical condition  Airway Management Planned: Natural Airway  Additional Equipment:   Intra-op Plan:   Post-operative Plan:   Informed Consent: I have reviewed the patients History and Physical, chart, labs and discussed the procedure including the risks, benefits and alternatives for the proposed anesthesia with the patient or authorized representative who has indicated his/her understanding and acceptance.     Plan Discussed with: CRNA  Anesthesia Plan Comments:         Anesthesia Quick Evaluation

## 2017-11-01 NOTE — Op Note (Signed)
[facility] Gastroenterology Patient Name: Caroline Conway Procedure Date: 11/01/2017 8:04 AM MRN: [id number] Account #: [id number] Date of Birth: 09/26/1940 Admit Type: Outpatient Age: 77 Room: [facility] OR ROOM 01 Gender: Female Note Status: Finalized Procedure:            Colonoscopy Indications:          Change in bowel habits, Change in stool caliber Providers:            Lucilla Lame MD, MD Referring MD:         Halina Maidens, MD (Referring MD) Medicines:            Propofol per Anesthesia Complications:        No immediate complications. Procedure:            Pre-Anesthesia Assessment:                       - Prior to the procedure, a History and Physical was                        performed, and patient medications and allergies were                        reviewed. The patient's tolerance of previous                        anesthesia was also reviewed. The risks and benefits of                        the procedure and the sedation options and risks were                        discussed with the patient. All questions were                        answered, and informed consent was obtained. Prior                        Anticoagulants: The patient has taken no previous                        anticoagulant or antiplatelet agents. ASA Grade                        Assessment: II - A patient with mild systemic disease.                        After reviewing the risks and benefits, the patient was                        deemed in satisfactory condition to undergo the                        procedure.                       After obtaining informed consent, the colonoscope was                        passed under direct vision. Throughout the procedure,  the patient's blood pressure, pulse, and oxygen                        saturations were monitored continuously. The Olympus                        CF-HQ190L Colonoscope (S#. [id number]) was introduced                         through the anus and advanced to the the cecum,                        identified by appendiceal orifice and ileocecal valve.                        The colonoscopy was performed without difficulty. The                        patient tolerated the procedure well. The quality of                        the bowel preparation was excellent. Findings:      The perianal and digital rectal examinations were normal.      A 5 mm polyp was found in the hepatic flexure. The polyp was sessile.       The polyp was removed with a cold biopsy forceps. Resection and       retrieval were complete.      An area of mildly congested mucosa was found at the ileocecal valve.       This was biopsied with a cold forceps for histology.      Many small-mouthed diverticula were found in the sigmoid colon. Impression:           - One 5 mm polyp at the hepatic flexure, removed with a                        cold biopsy forceps. Resected and retrieved.                       - Congested mucosa at the ileocecal valve. Biopsied.                       - Diverticulosis in the sigmoid colon. Recommendation:       - Discharge patient to home.                       - Resume previous diet.                       - Continue present medications.                       - Await pathology results. Procedure Code(s):    --- Professional ---                       [phone], Colonoscopy, flexible; with biopsy, single or                        multiple Diagnosis Code(s):    --- Professional ---  R19.5, Other fecal abnormalities                       D12.3, Benign neoplasm of transverse colon (hepatic                        flexure or splenic flexure) CPT copyright 2016 American Medical Association. All rights reserved. The codes documented in this report are preliminary and upon coder review may  be revised to meet current compliance requirements. Lucilla Lame MD, MD 11/01/2017 8:35:30 AM This  report has been signed electronically. Number of Addenda: 0 Note Initiated On: 11/01/2017 8:04 AM Scope Withdrawal Time: 0 hours 7 minutes 16 seconds  Total Procedure Duration: 0 hours 9 minutes 51 seconds       [facility]

## 2017-11-01 NOTE — Anesthesia Procedure Notes (Signed)
Procedure Name: MAC Date/Time: 11/01/2017 8:07 AM Performed by: Janna Arch, CRNA Pre-anesthesia Checklist: Patient identified, Emergency Drugs available, Suction available and Patient being monitored Patient Re-evaluated:Patient Re-evaluated prior to induction Oxygen Delivery Method: Nasal cannula

## 2017-11-01 NOTE — H&P (Signed)
Lucilla Lame, MD [facility] [address] [place], [place] [address] [phone] Fax : [phone]  Primary Care Physician:  Glean Hess, MD Primary Gastroenterologist:  Dr. Allen Norris  Pre-Procedure History & Physical: HPI:  Caroline Conway is a 77 y.o. female is here for an endoscopy.   Past Medical History:  Diagnosis Date  . Cancer ([place])    skin ca  . Collagen vascular disease (HCC)    RA  . Dyspnea   . H/O total hysterectomy   . Heartburn   . HLD (hyperlipidemia)   . Hypertension   . Neuromuscular disorder (HCC)    neuropathy in Left lower leg and foot  . Neuropathy   . Rheumatoid arteritis     Past Surgical History:  Procedure Laterality Date  . ANTERIOR AND POSTERIOR VAGINAL REPAIR  04/2015  . APPENDECTOMY  1960  . BREAST CYST ASPIRATION Left    lt fna- neg  . [place] SURGERY  2011  . riight shoulder surgery Right 1999  . SHOULDER SURGERY Left 2006  . TOTAL KNEE ARTHROPLASTY Left 2011  . VAGINAL HYSTERECTOMY  04/2015    Prior to Admission medications   Medication Sig Start Date End Date Taking? Authorizing Provider  acyclovir (ZOVIRAX) 200 MG capsule TAKE 1 CAPSULE (200 MG TOTAL) BY MOUTH 2 (TWO) TIMES DAILY. Patient taking differently: Take 200 mg by mouth as needed.  07/18/17  Yes Glean Hess, MD  atenolol (TENORMIN) 50 MG tablet TAKE 1 TABLET BY MOUTH DAILY 10/15/17  Yes Glean Hess, MD  Biotin 5000 MCG CAPS Take by mouth.   Yes [provider]  esomeprazole (NEXIUM) 20 MG capsule Take 1 capsule by mouth daily.   Yes [provider]  famotidine (PEPCID) 20 MG tablet TAKE 1 TABLET BY MOUTH EVERYDAY AT BEDTIME 10/30/17  Yes Glean Hess, MD  folic acid (FOLVITE) 1 MG tablet Take 2 tablets by mouth daily.    Yes [provider]  gabapentin (NEURONTIN) 100 MG capsule one at night 06/28/17  Yes [provider]  hydrochlorothiazide (HYDRODIURIL) 25 MG tablet Take 2 tablets (50 mg total) by mouth  daily. 10/01/17  Yes Glean Hess, MD  PREMARIN vaginal cream INSERT 1 APPLICATORFUL VAGINALLY AT BEDTIME 07/03/17  Yes Glean Hess, MD  trimethoprim (TRIMPEX) 100 MG tablet Take 1 tablet (100 mg total) by mouth daily. 10/03/16  Yes MacDiarmid, Nicki Reaper, MD  zolpidem (AMBIEN) 5 MG tablet TAKE 1 TABLET BY MOUTH AT BEDTIME 08/22/17  Yes Glean Hess, MD  methotrexate 2.5 MG tablet Take 5 tablets by mouth once a week.     [provider]  simvastatin (ZOCOR) 20 MG tablet TAKE 1 TABLET (20 MG TOTAL) BY MOUTH DAILY. 05/02/17   Glean Hess, MD  traZODone (DESYREL) 50 MG tablet Take 1 tablet (50 mg total) by mouth at bedtime. Patient not taking: Reported on 10/24/2017 10/03/17   Glean Hess, MD    Allergies as of 10/02/2017 - Review Complete 09/30/2017  Allergen Reaction Noted  . Codeine sulfate Hives 03/21/2015  . Oxycodone Itching 03/21/2015  . Propoxyphene Hives 07/07/2015  . Doxycycline Other (See Comments) 09/16/2017  . Mirtazapine Swelling 05/14/2017  . Penicillins Rash 01/29/2015  . Sulfa antibiotics Rash 01/29/2015    Family History  Problem Relation Age of Onset  . Alzheimer's disease Mother   . Lung cancer Father   . Stomach cancer Brother   . Prostate cancer Neg Hx   . Kidney  cancer Neg Hx   . Bladder Cancer Neg Hx   . Breast cancer Neg Hx     Social History   Socioeconomic History  . Marital status: Widowed    Spouse name: Not on file  . Number of children: Not on file  . Years of education: Not on file  . Highest education level: Not on file  Social Needs  . Financial resource strain: Not on file  . Food insecurity - worry: Not on file  . Food insecurity - inability: Not on file  . Transportation needs - medical: Not on file  . Transportation needs - non-medical: Not on file  Occupational History  . Not on file  Tobacco Use  . Smoking status: Never Smoker  . Smokeless tobacco: Never Used  Substance and Sexual Activity  . Alcohol  use: No    Alcohol/week: 0.0 oz  . Drug use: No  . Sexual activity: No  Other Topics Concern  . Not on file  Social History Narrative  . Not on file    Review of Systems: See HPI, otherwise negative ROS  Physical Exam: BP 106/69   Pulse 65   Temp 98.4 F (36.9 C) (Temporal)   Resp 15   Ht 5' (1.524 m)   Wt 146 lb (66.2 kg)   SpO2 99%   BMI 28.51 kg/m  General:   Alert,  pleasant and cooperative in NAD Head:  Normocephalic and atraumatic. Neck:  Supple; no masses or thyromegaly. Lungs:  Clear throughout to auscultation.    Heart:  Regular rate and rhythm. Abdomen:  Soft, nontender and nondistended. Normal bowel sounds, without guarding, and without rebound.   Neurologic:  Alert and  oriented x4;  grossly normal neurologically.  Impression/Plan: Caroline Conway is here for an endoscopy and colonoscopy to be performed for change in bowel habits and dysphagia  Risks, benefits, limitations, and alternatives regarding  endoscopy and colonoscopy have been reviewed with the patient.  Questions have been answered.  All parties agreeable.   Lucilla Lame, MD  11/01/2017, 7:26 AM

## 2017-11-01 NOTE — Op Note (Addendum)
[facility] Patient Name: Caroline Conway Procedure Date: 11/01/2017 8:05 AM MRN: [id number] Account #: [id number] Date of Birth: [birth year] Admit Type: Outpatient Age: 77 Room: [facility] OR ROOM 01 Gender: Female Note Status: Finalized Procedure:            Upper GI endoscopy Indications:          Dysphagia Providers:            Lucilla Lame MD, MD Referring MD:         Halina Maidens, MD (Referring MD) Medicines:            Propofol per Anesthesia Complications:        No immediate complications. Procedure:            Pre-Anesthesia Assessment:                       - Prior to the procedure, a History and Physical was                        performed, and patient medications and allergies were                        reviewed. The patient's tolerance of previous                        anesthesia was also reviewed. The risks and benefits of                        the procedure and the sedation options and risks were                        discussed with the patient. All questions were                        answered, and informed consent was obtained. Prior                        Anticoagulants: The patient has taken no previous                        anticoagulant or antiplatelet agents. ASA Grade                        Assessment: II - A patient with mild systemic disease.                        After reviewing the risks and benefits, the patient was                        deemed in satisfactory condition to undergo the                        procedure.                       After obtaining informed consent, the endoscope was                        passed under direct vision. Throughout the procedure,  the patient's blood pressure, pulse, and oxygen                        saturations were monitored continuously. The Olympus                        GIF H180J Endoscope (S#: [id number]) was introduced                        through the  mouth, and advanced to the second part of                        duodenum. The upper GI endoscopy was accomplished                        without difficulty. The patient tolerated the procedure                        well. Findings:      One mild benign-appearing, intrinsic stenosis was found in the upper       third of the esophagus. And was traversed. A TTS dilator was passed       through the scope. Dilation with a 12-13.5-15 mm balloon dilator was       performed to 15 mm. The dilation site was examined following endoscope       reinsertion and showed complete resolution of luminal narrowing.      The stomach was normal.      The examined duodenum was normal. Impression:           - Benign-appearing esophageal stenosis. Dilated.                       - Normal stomach.                       - Normal examined duodenum.                       - No specimens collected. Recommendation:       - Discharge patient to home.                       - Resume previous diet.                       - Continue present medications.                       - Perform a colonoscopy today. Procedure Code(s):    --- Professional ---                       [id number], Esophagogastroduodenoscopy, flexible, transoral;                        with transendoscopic balloon dilation of esophagus                        (less than 30 mm diameter) Diagnosis Code(s):    --- Professional ---                       R13.10, Dysphagia, unspecified  K22.2, Esophageal obstruction CPT copyright 2016 American Medical Association. All rights reserved. The codes documented in this report are preliminary and upon coder review may  be revised to meet current compliance requirements. Lucilla Lame MD, MD 11/01/2017 8:21:28 AM This report has been signed electronically. Number of Addenda: 0 Note Initiated On: 11/01/2017 8:05 AM      [facility]

## 2017-11-01 NOTE — Anesthesia Postprocedure Evaluation (Signed)
Anesthesia Post Note  Patient: CLORIS FLIPPO  Procedure(s) Performed: COLONOSCOPY WITH PROPOFOL (N/A Rectum) ESOPHAGOGASTRODUODENOSCOPY (EGD) WITH PROPOFOL (N/A Throat) ESOPHAGEAL DILATION (Throat) POLYPECTOMY (Rectum)  Patient location during evaluation: PACU Anesthesia Type: General Level of consciousness: awake and alert and oriented Pain management: satisfactory to patient Vital Signs Assessment: post-procedure vital signs reviewed and stable Respiratory status: spontaneous breathing, nonlabored ventilation and respiratory function stable Cardiovascular status: blood pressure returned to baseline and stable Postop Assessment: Adequate PO intake and No signs of nausea or vomiting Anesthetic complications: no    Raliegh Ip

## 2017-11-04 ENCOUNTER — Encounter: Payer: Self-pay | Admitting: Gastroenterology

## 2017-11-05 ENCOUNTER — Encounter: Payer: Self-pay | Admitting: Gastroenterology

## 2017-11-06 ENCOUNTER — Encounter: Payer: Self-pay | Admitting: Gastroenterology

## 2017-12-06 ENCOUNTER — Telehealth: Payer: Self-pay | Admitting: Pulmonary Disease

## 2017-12-06 NOTE — Telephone Encounter (Signed)
Patient declines to schedule further appts as her issues were resolved with esophageal stretching.  Deleting recall per patient request

## 2017-12-09 ENCOUNTER — Encounter: Payer: Self-pay | Admitting: Urology

## 2017-12-09 ENCOUNTER — Ambulatory Visit: Payer: PPO | Admitting: Urology

## 2017-12-09 VITALS — BP 138/76 | HR 60 | Ht 60.0 in | Wt 144.0 lb

## 2017-12-09 DIAGNOSIS — N3946 Mixed incontinence: Secondary | ICD-10-CM | POA: Diagnosis not present

## 2017-12-09 MED ORDER — TRIMETHOPRIM 100 MG PO TABS
100.0000 mg | ORAL_TABLET | Freq: Every day | ORAL | 11 refills | Status: DC
Start: 2017-12-09 — End: 2019-01-23

## 2017-12-09 NOTE — Progress Notes (Signed)
12/09/2017 2:05 PM   Caroline Conway Sep 17, 1940 [id number]  Referring provider: Glean Hess, MD [address] [place], [place] [address]  Chief Complaint  Patient presents with  . Urinary Incontinence    6 month    HPI: The patient has mixed incontinence and mild frequency and nocturia. She has chronic cystitis  The patient has urge incontinence worse than her stress incontinence and rare bedwetting. She had failed oxybutynin and Vesicare in the past  She wears 3 pads a day and many of them are damp and her baseline was 6 pads per day with more severe leakage I did not think the dry eye was due to the medication.   The patient describes two breakthrough infections and she self treat her self once with Keflex. I will send a urine for culture today. I last saw her in March and she did have a positive urine culture in April. She will continue to get cultures  She does say the beta 3 agonist reduces her incontinence but also her frequency and urgency. She paced $40 a month but soon will go into a very expensive donut hole until the end of the year  She understands that there is not a generic beta 3 agonist and she has failed 2 other antimuscarinics. 2 months the samples with a repeat appointment before the end of the year was discussed   The patient had a breakthrough on Macrodantin and it caused loose bowel movements. She does better on the beta 3 agonist. She currently went back on trimethoprim and we decided to stay on it even know it is not been perfect. She understands a daily Keflex as an option and she's had Keflex short-term in the past. We decided not to use it yet. She is somewhat frustrated and I can empathize with her. Again pathophysiology was discussed 2 more times. Treatment tools were discussed. I did not prescribe local estrogen cream. See her in 6 months. Repeated twice that the beta 3 agonist does not cause infection  Today Medically infection  free.  She has been off her medication for 1 month due to prescription issues.  Frequency improved.  No longer taking Myrbetriq.  She is off to antidepressants.  Frequency stable   PMH: Past Medical History:  Diagnosis Date  . Cancer ([place])    skin ca  . Collagen vascular disease (HCC)    RA  . Dyspnea   . H/O total hysterectomy   . Heartburn   . HLD (hyperlipidemia)   . Hypertension   . Neuromuscular disorder (HCC)    neuropathy in Left lower leg and foot  . Neuropathy   . Rheumatoid arteritis     Surgical History: Past Surgical History:  Procedure Laterality Date  . ANTERIOR AND POSTERIOR VAGINAL REPAIR  04/2015  . APPENDECTOMY  1960  . BREAST CYST ASPIRATION Left    lt fna- neg  . COLONOSCOPY WITH PROPOFOL N/A 11/01/2017   Procedure: COLONOSCOPY WITH PROPOFOL;  Surgeon: Lucilla Lame, MD;  Location: [place];  Service: Endoscopy;  Laterality: N/A;  . ESOPHAGEAL DILATION  11/01/2017   Procedure: ESOPHAGEAL DILATION;  Surgeon: Lucilla Lame, MD;  Location: [place];  Service: Endoscopy;;  . ESOPHAGOGASTRODUODENOSCOPY (EGD) WITH PROPOFOL N/A 11/01/2017   Procedure: ESOPHAGOGASTRODUODENOSCOPY (EGD) WITH PROPOFOL;  Surgeon: Lucilla Lame, MD;  Location: [place];  Service: Endoscopy;  Laterality: N/A;  . LUMBAR [place] SURGERY  2011  . POLYPECTOMY  11/01/2017   Procedure: POLYPECTOMY;  Surgeon: Lucilla Lame, MD;  Location: [place];  Service: Endoscopy;;  . riight shoulder surgery Right 1999  . SHOULDER SURGERY Left 2006  . TOTAL KNEE ARTHROPLASTY Left 2011  . VAGINAL HYSTERECTOMY  04/2015    Home Medications:  Allergies as of 12/09/2017      Reactions   Codeine Sulfate Hives   Oxycodone Itching   Propoxyphene Hives   Doxycycline Other (See Comments)   Headache   Mirtazapine Swelling   Penicillins Rash   Sulfa Antibiotics Rash      Medication List        Accurate as of 12/09/17  2:05 PM. Always use your most recent med list.           acyclovir 200 MG capsule Commonly known as:  ZOVIRAX TAKE 1 CAPSULE (200 MG TOTAL) BY MOUTH 2 (TWO) TIMES DAILY.   atenolol 50 MG tablet Commonly known as:  TENORMIN TAKE 1 TABLET BY MOUTH DAILY   Biotin 5000 MCG Caps Take by mouth.   esomeprazole 20 MG capsule Commonly known as:  NEXIUM Take 1 capsule by mouth daily.   famotidine 20 MG tablet Commonly known as:  PEPCID TAKE 1 TABLET BY MOUTH EVERYDAY AT BEDTIME   folic acid 1 MG tablet Commonly known as:  FOLVITE Take 2 tablets by mouth daily.   gabapentin 100 MG capsule Commonly known as:  NEURONTIN one at night   hydrochlorothiazide 25 MG tablet Commonly known as:  HYDRODIURIL Take 2 tablets (50 mg total) by mouth daily.   methotrexate 2.5 MG tablet Take 5 tablets by mouth once a week.   PREMARIN vaginal cream Generic drug:  conjugated estrogens INSERT 1 APPLICATORFUL VAGINALLY AT BEDTIME   simvastatin 20 MG tablet Commonly known as:  ZOCOR TAKE 1 TABLET (20 MG TOTAL) BY MOUTH DAILY.   trimethoprim 100 MG tablet Commonly known as:  TRIMPEX Take 1 tablet (100 mg total) by mouth daily.   zolpidem 5 MG tablet Commonly known as:  AMBIEN TAKE 1 TABLET BY MOUTH AT BEDTIME       Allergies:  Allergies  Allergen Reactions  . Codeine Sulfate Hives  . Oxycodone Itching  . Propoxyphene Hives  . Doxycycline Other (See Comments)    Headache  . Mirtazapine Swelling  . Penicillins Rash  . Sulfa Antibiotics Rash    Family History: Family History  Problem Relation Age of Onset  . Alzheimer's disease Mother   . Lung cancer Father   . Stomach cancer Brother   . Prostate cancer Neg Hx   . Kidney cancer Neg Hx   . Bladder Cancer Neg Hx   . Breast cancer Neg Hx     Social History:  reports that she has never smoked. She has never used smokeless tobacco. She reports that she does not drink alcohol or use drugs.  ROS: UROLOGY Frequent Urination?: No Hard to postpone urination?: No Burning/pain with  urination?: No Get up at night to urinate?: No Leakage of urine?: No Urine stream starts and stops?: No Trouble starting stream?: No Do you have to strain to urinate?: No Blood in urine?: No Urinary tract infection?: No Sexually transmitted disease?: No Injury to kidneys or bladder?: No Painful intercourse?: No Weak stream?: No Currently pregnant?: No Vaginal bleeding?: No Last menstrual period?: n  Gastrointestinal Nausea?: No Vomiting?: No Indigestion/heartburn?: No Diarrhea?: No Constipation?: No  Constitutional Fever: No Night sweats?: No Weight loss?: No Fatigue?: No  Skin Skin rash/lesions?: No Itching?: No  Eyes Blurred vision?:  No Double vision?: No  Ears/Nose/Throat Sore throat?: No Sinus problems?: No  Hematologic/Lymphatic Swollen glands?: No Easy bruising?: No  Cardiovascular Leg swelling?: No Chest pain?: No  Respiratory Cough?: No Shortness of breath?: No  Endocrine Excessive thirst?: No  Musculoskeletal Back pain?: No Joint pain?: No  Neurological Headaches?: No Dizziness?: No  Psychologic Depression?: No Anxiety?: No  Physical Exam: BP 138/76   Pulse 60   Ht 5' (1.524 m)   Wt 65.3 kg (144 lb)   BMI 28.12 kg/m   Constitutional:  Alert and oriented, No acute distress.   Laboratory Data: Lab Results  Component Value Date   WBC 5.7 03/19/2017   HGB 11.6 03/19/2017   HCT 34.7 03/19/2017   MCV 94 03/19/2017   PLT 276 03/19/2017    Lab Results  Component Value Date   CREATININE 1.00 03/19/2017    No results found for: PSA  No results found for: TESTOSTERONE  No results found for: HGBA1C  Urinalysis    Component Value Date/Time   COLORURINE YELLOW 08/17/2016 1104   APPEARANCEUR Clear 04/08/2017 1610   LABSPEC 1.015 08/17/2016 1104   LABSPEC 1.015 03/01/2013 1325   PHURINE 7.5 08/17/2016 1104   GLUCOSEU Negative 04/08/2017 1610   GLUCOSEU NEGATIVE 03/01/2013 1325   HGBUR MODERATE (A) 08/17/2016 1104    BILIRUBINUR neg 05/14/2017 1530   BILIRUBINUR Negative 04/08/2017 1610   BILIRUBINUR NEGATIVE 03/01/2013 1325   KETONESUR NEGATIVE 08/17/2016 1104   PROTEINUR neg 05/14/2017 1530   PROTEINUR Negative 04/08/2017 1610   PROTEINUR NEGATIVE 08/17/2016 1104   UROBILINOGEN 0.2 05/14/2017 1530   NITRITE positive 05/14/2017 1530   NITRITE Negative 04/08/2017 1610   NITRITE NEGATIVE 08/17/2016 1104   LEUKOCYTESUR Large (3+) (A) 05/14/2017 1530   LEUKOCYTESUR Negative 04/08/2017 1610   LEUKOCYTESUR 3+ 03/01/2013 1325    Pertinent Imaging:   Assessment & Plan: Trimethoprim prescribed and see in 1 year  There are no diagnoses linked to this encounter.  No follow-ups on file.  Reece Packer, MD  [facility] [address] [place], [place] [address] [phone]

## 2018-01-04 ENCOUNTER — Other Ambulatory Visit: Payer: Self-pay | Admitting: Internal Medicine

## 2018-01-04 DIAGNOSIS — I10 Essential (primary) hypertension: Secondary | ICD-10-CM

## 2018-01-08 DIAGNOSIS — Z79899 Other long term (current) drug therapy: Secondary | ICD-10-CM | POA: Diagnosis not present

## 2018-01-08 DIAGNOSIS — M159 Polyosteoarthritis, unspecified: Secondary | ICD-10-CM | POA: Diagnosis not present

## 2018-01-08 DIAGNOSIS — M0579 Rheumatoid arthritis with rheumatoid factor of multiple sites without organ or systems involvement: Secondary | ICD-10-CM | POA: Diagnosis not present

## 2018-01-08 DIAGNOSIS — M810 Age-related osteoporosis without current pathological fracture: Secondary | ICD-10-CM | POA: Diagnosis not present

## 2018-01-17 ENCOUNTER — Encounter: Payer: Self-pay | Admitting: Internal Medicine

## 2018-01-17 ENCOUNTER — Ambulatory Visit (INDEPENDENT_AMBULATORY_CARE_PROVIDER_SITE_OTHER): Payer: PPO | Admitting: Internal Medicine

## 2018-01-17 VITALS — BP 118/74 | HR 74 | Resp 16 | Ht 60.0 in | Wt 144.0 lb

## 2018-01-17 DIAGNOSIS — R1310 Dysphagia, unspecified: Secondary | ICD-10-CM

## 2018-01-17 DIAGNOSIS — F5101 Primary insomnia: Secondary | ICD-10-CM

## 2018-01-17 DIAGNOSIS — M069 Rheumatoid arthritis, unspecified: Secondary | ICD-10-CM | POA: Diagnosis not present

## 2018-01-17 DIAGNOSIS — E785 Hyperlipidemia, unspecified: Secondary | ICD-10-CM | POA: Diagnosis not present

## 2018-01-17 DIAGNOSIS — I10 Essential (primary) hypertension: Secondary | ICD-10-CM | POA: Diagnosis not present

## 2018-01-17 DIAGNOSIS — Z0000 Encounter for general adult medical examination without abnormal findings: Secondary | ICD-10-CM | POA: Diagnosis not present

## 2018-01-17 DIAGNOSIS — R1319 Other dysphagia: Secondary | ICD-10-CM

## 2018-01-17 LAB — POCT URINALYSIS DIPSTICK
Bilirubin, UA: NEGATIVE
GLUCOSE UA: NEGATIVE
Ketones, UA: NEGATIVE
Leukocytes, UA: NEGATIVE
Nitrite, UA: NEGATIVE
PROTEIN UA: NEGATIVE
Spec Grav, UA: 1.02 (ref 1.010–1.025)
Urobilinogen, UA: 0.2 E.U./dL
pH, UA: 6.5 (ref 5.0–8.0)

## 2018-01-17 MED ORDER — SIMVASTATIN 20 MG PO TABS: 20.0000 mg | ORAL_TABLET | Freq: Every day | ORAL | 12 refills | Status: DC

## 2018-01-17 MED ORDER — ZOLPIDEM TARTRATE 5 MG PO TABS: 5.0000 mg | ORAL_TABLET | Freq: Every day | ORAL | 5 refills | Status: DC

## 2018-01-17 NOTE — Progress Notes (Signed)
Date:  01/17/2018   Name:  Caroline Conway   DOB:  September 12, 1940   MRN:  [id number]   Chief Complaint: Annual Exam Caroline Conway is a 77 y.o. female who presents today for her Complete Annual Exam. She feels fairly well. She reports exercising doing house and yard work - can not do CV exercise. She reports she is sleeping fairly well as long as she take [place]. Mammogram was done several months ago.  Hypertension  This is a chronic problem. The problem is controlled. Pertinent negatives include no chest pain, headaches, palpitations or shortness of breath. Past treatments include beta blockers and diuretics. The current treatment provides significant improvement.  Gastroesophageal Reflux  She complains of dysphagia (mild,slightly improved after EGd). She reports no abdominal pain, no chest pain, no coughing or no wheezing. The problem occurs rarely. Pertinent negatives include no fatigue. She has tried a PPI and a histamine-2 antagonist for the symptoms. The treatment provided significant relief. Past procedures include an EGD.  Insomnia  Primary symptoms: sleep disturbance.  The problem occurs nightly. Past treatments include medication Lorrin Mais works well, did not try trazodone). The treatment provided significant relief.  Hyperlipidemia  This is a chronic problem. The problem is controlled. Pertinent negatives include no chest pain or shortness of breath. Current antihyperlipidemic treatment includes statins. The current treatment provides significant improvement of lipids.  RA - on MTX with stable symptoms.  No recent flare,  Walks with cane.  She has had 2 falls this year - was able to get up on her own with some difficulty - no injury.  She got [employer] last week.   Review of Systems  Constitutional: Negative for chills, fatigue and fever.  HENT: Positive for hearing loss and trouble swallowing. Negative for congestion, tinnitus and voice change.   Eyes: Negative for visual  disturbance.  Respiratory: Negative for cough, chest tightness, shortness of breath and wheezing.   Cardiovascular: Negative for chest pain, palpitations and leg swelling.  Gastrointestinal: Positive for dysphagia (mild,slightly improved after EGd). Negative for abdominal pain, constipation, diarrhea and vomiting.  Endocrine: Negative for polydipsia and polyuria.  Genitourinary: Negative for dysuria, frequency, genital sores, vaginal bleeding and vaginal discharge.  Musculoskeletal: Positive for arthralgias and gait problem. Negative for joint swelling.  Skin: Negative for color change and rash.  Neurological: Negative for dizziness, tremors, light-headedness and headaches.  Hematological: Negative for adenopathy. Does not bruise/bleed easily.  Psychiatric/Behavioral: Positive for sleep disturbance. Negative for dysphoric mood. The patient has insomnia. The patient is not nervous/anxious.     Patient Active Problem List   Diagnosis Date Noted  . Abnormal feces   . Benign neoplasm of transverse colon   . Esophageal dysphagia   . Stricture and stenosis of esophagus   . Abnormal CXR 03/21/2017  . Chronic renal insufficiency, stage 3 (moderate) ([place]) 03/19/2017  . Venous insufficiency of both lower extremities 02/13/2017  . Herpes simplex infection 07/07/2015  . Anxiety disorder 01/29/2015  . Atrophic vaginitis 01/29/2015  . Dyslipidemia 01/29/2015  . Essential (primary) hypertension 01/29/2015  . Acid reflux 01/29/2015  . Idiopathic peripheral neuropathy 01/29/2015  . Idiopathic insomnia 01/29/2015  . Rheumatoid arthritis involving multiple joints ([place]) 01/29/2015  . Urge incontinence 01/29/2015  . Pelvic relaxation due to vaginal prolapse 01/29/2015    Prior to Admission medications   Medication Sig Start Date End Date Taking? Authorizing Provider  atenolol (TENORMIN) 50 MG tablet TAKE 1 TABLET BY MOUTH DAILY 10/15/17  Yes Glean Hess,  MD  Biotin 5000 MCG CAPS Take by mouth.    Yes [provider]  esomeprazole (NEXIUM) 20 MG capsule Take 1 capsule by mouth daily.   Yes [provider]  famotidine (PEPCID) 20 MG tablet TAKE 1 TABLET BY MOUTH EVERYDAY AT BEDTIME 10/30/17  Yes Glean Hess, MD  folic acid (FOLVITE) 1 MG tablet Take 2 tablets by mouth daily.    Yes [provider]  hydrochlorothiazide (HYDRODIURIL) 25 MG tablet TAKE 2 TABLETS BY MOUTH EVERY DAY 01/05/18  Yes Glean Hess, MD  methotrexate 2.5 MG tablet Take 5 tablets by mouth once a week.    Yes [provider]  PREMARIN vaginal cream INSERT 1 APPLICATORFUL VAGINALLY AT BEDTIME 07/03/17  Yes Glean Hess, MD  simvastatin (ZOCOR) 20 MG tablet TAKE 1 TABLET (20 MG TOTAL) BY MOUTH DAILY. 05/02/17  Yes Glean Hess, MD  trimethoprim (TRIMPEX) 100 MG tablet Take 1 tablet (100 mg total) by mouth daily. 12/09/17  Yes MacDiarmid, Nicki Reaper, MD  zolpidem (AMBIEN) 5 MG tablet TAKE 1 TABLET BY MOUTH AT BEDTIME 08/22/17  Yes Glean Hess, MD  acyclovir (ZOVIRAX) 200 MG capsule TAKE 1 CAPSULE (200 MG TOTAL) BY MOUTH 2 (TWO) TIMES DAILY. Patient not taking: Reported on 01/17/2018 07/18/17   Glean Hess, MD    Allergies  Allergen Reactions  . Codeine Sulfate Hives  . Oxycodone Itching  . Propoxyphene Hives  . Doxycycline Other (See Comments)    Headache  . Mirtazapine Swelling  . Penicillins Rash  . Sulfa Antibiotics Rash    Past Surgical History:  Procedure Laterality Date  . ANTERIOR AND POSTERIOR VAGINAL REPAIR  04/2015  . APPENDECTOMY  1960  . BREAST CYST ASPIRATION Left    lt fna- neg  . COLONOSCOPY WITH PROPOFOL N/A 11/01/2017   Procedure: COLONOSCOPY WITH PROPOFOL;  Surgeon: Lucilla Lame, MD;  Location: [facility];  Service: Endoscopy;  Laterality: N/A;  . ESOPHAGEAL DILATION  11/01/2017   Procedure: ESOPHAGEAL DILATION;  Surgeon: Lucilla Lame, MD;  Location: [facility];  Service: Endoscopy;;  . ESOPHAGOGASTRODUODENOSCOPY (EGD)  WITH PROPOFOL N/A 11/01/2017   Procedure: ESOPHAGOGASTRODUODENOSCOPY (EGD) WITH PROPOFOL;  Surgeon: Lucilla Lame, MD;  Location: [facility];  Service: Endoscopy;  Laterality: N/A;  . LUMBAR [place] SURGERY  2011  . POLYPECTOMY  11/01/2017   Procedure: POLYPECTOMY;  Surgeon: Lucilla Lame, MD;  Location: [facility];  Service: Endoscopy;;  . riight shoulder surgery Right 1999  . SHOULDER SURGERY Left 2006  . TOTAL KNEE ARTHROPLASTY Left 2011  . VAGINAL HYSTERECTOMY  04/2015    Social History   Tobacco Use  . Smoking status: Never Smoker  . Smokeless tobacco: Never Used  Substance Use Topics  . Alcohol use: No    Alcohol/week: 0.0 oz  . Drug use: No     Medication list has been reviewed and updated.  PHQ 2/9 Scores 01/17/2018 09/11/2017 04/01/2017 05/01/2016  PHQ - 2 Score 0 0 1 0  PHQ- 9 Score - 0 - -    Physical Exam  Constitutional: She is oriented to person, place, and time. She appears well-developed and well-nourished. No distress.  HENT:  Head: Normocephalic and atraumatic.  Right Ear: Tympanic membrane and ear canal normal.  Left Ear: Tympanic membrane and ear canal normal.  Nose: Right sinus exhibits no maxillary sinus tenderness. Left sinus exhibits no maxillary sinus tenderness.  Mouth/Throat: Uvula is midline and oropharynx is clear and moist.  Eyes: Conjunctivae and  EOM are normal. Right eye exhibits no discharge. Left eye exhibits no discharge. No scleral icterus.  Neck: Normal range of motion. Carotid bruit is not present. No erythema present. No thyromegaly present.  Cardiovascular: Normal rate, regular rhythm, normal heart sounds and normal pulses.  Pulmonary/Chest: Effort normal. No respiratory distress. She has no wheezes. Right breast exhibits no mass, no nipple discharge, no skin change and no tenderness. Left breast exhibits no mass, no nipple discharge, no skin change and no tenderness.  Abdominal: Soft. Bowel sounds are normal. There is no  hepatosplenomegaly. There is no tenderness. There is no CVA tenderness.  Musculoskeletal: Normal range of motion.  Moderate kyphosis Walks with cane RA changes fingers and wrists  Lymphadenopathy:    She has no cervical adenopathy.    She has no axillary adenopathy.  Neurological: She is alert and oriented to person, place, and time. She has normal reflexes. No cranial nerve deficit or sensory deficit.  Skin: Skin is warm, dry and intact. No rash noted.  Psychiatric: She has a normal mood and affect. Her speech is normal and behavior is normal. Judgment and thought content normal. Cognition and memory are normal.  Nursing note and vitals reviewed.   BP 118/74   Pulse 74   Resp 16   Ht 5' (1.524 m)   Wt 144 lb (65.3 kg)   SpO2 97%   BMI 28.12 kg/m   Assessment and Plan: 1. Annual physical exam Has MAW in July - will consider getting Prevnar-13 at that time - POCT urinalysis dipstick  2. Essential (primary) hypertension controlled - CBC with Differential/Platelet - Comprehensive metabolic panel - TSH  3. Esophageal dysphagia Stable mild sx - continue dual therapy  4. Rheumatoid arthritis involving multiple joints ([facility]) Followed by Rheum - on MTX Ask about Shingrix vaccine  5. Idiopathic insomnia rec trial of trazodone instead of ambien - zolpidem (AMBIEN) 5 MG tablet; Take 1 tablet (5 mg total) by mouth at bedtime.  Dispense: 30 tablet; Refill: 5  6. Dyslipidemia On statin therapy - simvastatin (ZOCOR) 20 MG tablet; Take 1 tablet (20 mg total) by mouth daily.  Dispense: 30 tablet; Refill: 12 - Lipid panel   Meds ordered this encounter  Medications  . simvastatin (ZOCOR) 20 MG tablet    Sig: Take 1 tablet (20 mg total) by mouth daily.    Dispense:  30 tablet    Refill:  12  . zolpidem (AMBIEN) 5 MG tablet    Sig: Take 1 tablet (5 mg total) by mouth at bedtime.    Dispense:  30 tablet    Refill:  5    This request is for a new prescription for a controlled  substance as required by Federal/State law.    Partially dictated using Editor, commissioning. Any errors are unintentional.  Halina Maidens, MD [facility]  01/17/2018

## 2018-01-17 NOTE — Patient Instructions (Signed)
Health Maintenance for Postmenopausal Women Menopause is a normal process in which your reproductive ability comes to an end. This process happens gradually over a span of months to years, usually between the ages of 22 and 9. Menopause is complete when you have missed 12 consecutive menstrual periods. It is important to talk with your health care provider about some of the most common conditions that affect postmenopausal women, such as heart disease, cancer, and bone loss (osteoporosis). Adopting a healthy lifestyle and getting preventive care can help to promote your health and wellness. Those actions can also lower your chances of developing some of these common conditions. What should I know about menopause? During menopause, you may experience a number of symptoms, such as:  Moderate-to-severe hot flashes.  Night sweats.  Decrease in sex drive.  Mood swings.  Headaches.  Tiredness.  Irritability.  Memory problems.  Insomnia.  Choosing to treat or not to treat menopausal changes is an individual decision that you make with your health care provider. What should I know about hormone replacement therapy and supplements? Hormone therapy products are effective for treating symptoms that are associated with menopause, such as hot flashes and night sweats. Hormone replacement carries certain risks, especially as you become older. If you are thinking about using estrogen or estrogen with progestin treatments, discuss the benefits and risks with your health care provider. What should I know about heart disease and stroke? Heart disease, heart attack, and stroke become more likely as you age. This may be due, in part, to the hormonal changes that your body experiences during menopause. These can affect how your body processes dietary fats, triglycerides, and cholesterol. Heart attack and stroke are both medical emergencies. There are many things that you can do to help prevent heart disease  and stroke:  Have your blood pressure checked at least every 1-2 years. High blood pressure causes heart disease and increases the risk of stroke.  If you are 53-22 years old, ask your health care provider if you should take aspirin to prevent a heart attack or a stroke.  Do not use any tobacco products, including cigarettes, chewing tobacco, or electronic cigarettes. If you need help quitting, ask your health care provider.  It is important to eat a healthy diet and maintain a healthy weight. ? Be sure to include plenty of vegetables, fruits, low-fat dairy products, and lean protein. ? Avoid eating foods that are high in solid fats, added sugars, or salt (sodium).  Get regular exercise. This is one of the most important things that you can do for your health. ? Try to exercise for at least 150 minutes each week. The type of exercise that you do should increase your heart rate and make you sweat. This is known as moderate-intensity exercise. ? Try to do strengthening exercises at least twice each week. Do these in addition to the moderate-intensity exercise.  Know your numbers.Ask your health care provider to check your cholesterol and your blood glucose. Continue to have your blood tested as directed by your health care provider.  What should I know about cancer screening? There are several types of cancer. Take the following steps to reduce your risk and to catch any cancer development as early as possible. Breast Cancer  Practice breast self-awareness. ? This means understanding how your breasts normally appear and feel. ? It also means doing regular breast self-exams. Let your health care provider know about any changes, no matter how small.  If you are 40  or older, have a clinician do a breast exam (clinical breast exam or CBE) every year. Depending on your age, family history, and medical history, it may be recommended that you also have a yearly breast X-ray (mammogram).  If you  have a family history of breast cancer, talk with your health care provider about genetic screening.  If you are at high risk for breast cancer, talk with your health care provider about having an MRI and a mammogram every year.  Breast cancer (BRCA) gene test is recommended for women who have family members with BRCA-related cancers. Results of the assessment will determine the need for genetic counseling and BRCA1 and for BRCA2 testing. BRCA-related cancers include these types: ? Breast. This occurs in males or females. ? Ovarian. ? Tubal. This may also be called fallopian tube cancer. ? Cancer of the abdominal or pelvic lining (peritoneal cancer). ? Prostate. ? Pancreatic.  Cervical, Uterine, and Ovarian Cancer Your health care provider may recommend that you be screened regularly for cancer of the pelvic organs. These include your ovaries, uterus, and vagina. This screening involves a pelvic exam, which includes checking for microscopic changes to the surface of your cervix (Pap test).  For women ages 21-65, health care providers may recommend a pelvic exam and a Pap test every three years. For women ages 79-65, they may recommend the Pap test and pelvic exam, combined with testing for human papilloma virus (HPV), every five years. Some types of HPV increase your risk of cervical cancer. Testing for HPV may also be done on women of any age who have unclear Pap test results.  Other health care providers may not recommend any screening for nonpregnant women who are considered low risk for pelvic cancer and have no symptoms. Ask your health care provider if a screening pelvic exam is right for you.  If you have had past treatment for cervical cancer or a condition that could lead to cancer, you need Pap tests and screening for cancer for at least 20 years after your treatment. If Pap tests have been discontinued for you, your risk factors (such as having a new sexual partner) need to be  reassessed to determine if you should start having screenings again. Some women have medical problems that increase the chance of getting cervical cancer. In these cases, your health care provider may recommend that you have screening and Pap tests more often.  If you have a family history of uterine cancer or ovarian cancer, talk with your health care provider about genetic screening.  If you have vaginal bleeding after reaching menopause, tell your health care provider.  There are currently no reliable tests available to screen for ovarian cancer.  Lung Cancer Lung cancer screening is recommended for adults 69-62 years old who are at high risk for lung cancer because of a history of smoking. A yearly low-dose CT scan of the lungs is recommended if you:  Currently smoke.  Have a history of at least 30 pack-years of smoking and you currently smoke or have quit within the past 15 years. A pack-year is smoking an average of one pack of cigarettes per day for one year.  Yearly screening should:  Continue until it has been 15 years since you quit.  Stop if you develop a health problem that would prevent you from having lung cancer treatment.  Colorectal Cancer  This type of cancer can be detected and can often be prevented.  Routine colorectal cancer screening usually begins at  age 42 and continues through age 45.  If you have risk factors for colon cancer, your health care provider may recommend that you be screened at an earlier age.  If you have a family history of colorectal cancer, talk with your health care provider about genetic screening.  Your health care provider may also recommend using home test kits to check for hidden blood in your stool.  A small camera at the end of a tube can be used to examine your colon directly (sigmoidoscopy or colonoscopy). This is done to check for the earliest forms of colorectal cancer.  Direct examination of the colon should be repeated every  5-10 years until age 71. However, if early forms of precancerous polyps or small growths are found or if you have a family history or genetic risk for colorectal cancer, you may need to be screened more often.  Skin Cancer  Check your skin from head to toe regularly.  Monitor any moles. Be sure to tell your health care provider: ? About any new moles or changes in moles, especially if there is a change in a mole's shape or color. ? If you have a mole that is larger than the size of a pencil eraser.  If any of your family members has a history of skin cancer, especially at a young age, talk with your health care provider about genetic screening.  Always use sunscreen. Apply sunscreen liberally and repeatedly throughout the day.  Whenever you are outside, protect yourself by wearing long sleeves, pants, a wide-brimmed hat, and sunglasses.  What should I know about osteoporosis? Osteoporosis is a condition in which bone destruction happens more quickly than new bone creation. After menopause, you may be at an increased risk for osteoporosis. To help prevent osteoporosis or the bone fractures that can happen because of osteoporosis, the following is recommended:  If you are 46-71 years old, get at least 1,000 mg of calcium and at least 600 mg of vitamin D per day.  If you are older than age 55 but younger than age 65, get at least 1,200 mg of calcium and at least 600 mg of vitamin D per day.  If you are older than age 54, get at least 1,200 mg of calcium and at least 800 mg of vitamin D per day.  Smoking and excessive alcohol intake increase the risk of osteoporosis. Eat foods that are rich in calcium and vitamin D, and do weight-bearing exercises several times each week as directed by your health care provider. What should I know about how menopause affects my mental health? Depression may occur at any age, but it is more common as you become older. Common symptoms of depression  include:  Low or sad mood.  Changes in sleep patterns.  Changes in appetite or eating patterns.  Feeling an overall lack of motivation or enjoyment of activities that you previously enjoyed.  Frequent crying spells.  Talk with your health care provider if you think that you are experiencing depression. What should I know about immunizations? It is important that you get and maintain your immunizations. These include:  Tetanus, diphtheria, and pertussis (Tdap) booster vaccine.  Influenza every year before the flu season begins.  Pneumonia vaccine.  Shingles vaccine.  Your health care provider may also recommend other immunizations. This information is not intended to replace advice given to you by your health care provider. Make sure you discuss any questions you have with your health care provider. Document Released: 10/19/2005  Document Revised: 03/16/2016 Document Reviewed: 05/31/2015 Elsevier Interactive Patient Education  2018 [employer]  

## 2018-01-18 LAB — CBC WITH DIFFERENTIAL/PLATELET
Basophils Absolute: 0 10*3/uL (ref 0.0–0.2)
Basos: 1 %
EOS (ABSOLUTE): 0.2 10*3/uL (ref 0.0–0.4)
EOS: 3 %
HEMATOCRIT: 37.2 % (ref 34.0–46.6)
Hemoglobin: 12.4 g/dL (ref 11.1–15.9)
IMMATURE GRANS (ABS): 0 10*3/uL (ref 0.0–0.1)
IMMATURE GRANULOCYTES: 0 %
LYMPHS: 22 %
Lymphocytes Absolute: 1.3 10*3/uL (ref 0.7–3.1)
MCH: 31.7 pg (ref 26.6–33.0)
MCHC: 33.3 g/dL (ref 31.5–35.7)
MCV: 95 fL (ref 79–97)
Monocytes Absolute: 0.3 10*3/uL (ref 0.1–0.9)
Monocytes: 6 %
NEUTROS PCT: 68 %
Neutrophils Absolute: 3.9 10*3/uL (ref 1.4–7.0)
PLATELETS: 297 10*3/uL (ref 150–379)
RBC: 3.91 x10E6/uL (ref 3.77–5.28)
RDW: 15.1 % (ref 12.3–15.4)
WBC: 5.6 10*3/uL (ref 3.4–10.8)

## 2018-01-18 LAB — TSH: TSH: 1.16 u[IU]/mL (ref 0.450–4.500)

## 2018-01-18 LAB — COMPREHENSIVE METABOLIC PANEL
ALT: 20 IU/L (ref 0–32)
AST: 13 IU/L (ref 0–40)
Albumin/Globulin Ratio: 1.5 (ref 1.2–2.2)
Albumin: 4.4 g/dL (ref 3.5–4.8)
Alkaline Phosphatase: 64 IU/L (ref 39–117)
BUN/Creatinine Ratio: 22 (ref 12–28)
BUN: 22 mg/dL (ref 8–27)
Bilirubin Total: 0.5 mg/dL (ref 0.0–1.2)
CALCIUM: 9.4 mg/dL (ref 8.7–10.3)
CO2: 23 mmol/L (ref 20–29)
CREATININE: 0.98 mg/dL (ref 0.57–1.00)
Chloride: 95 mmol/L — ABNORMAL LOW (ref 96–106)
GFR calc Af Amer: 65 mL/min/{1.73_m2} (ref >59)
GFR, EST NON AFRICAN AMERICAN: 56 mL/min/{1.73_m2} — AB (ref >59)
Globulin, Total: 2.9 g/dL (ref 1.5–4.5)
Glucose: 81 mg/dL (ref 65–99)
Potassium: 4.4 mmol/L (ref 3.5–5.2)
Sodium: 135 mmol/L (ref 134–144)
Total Protein: 7.3 g/dL (ref 6.0–8.5)

## 2018-01-18 LAB — LIPID PANEL
Chol/HDL Ratio: 2.5 ratio (ref 0.0–4.4)
Cholesterol, Total: 183 mg/dL (ref 100–199)
HDL: 72 mg/dL (ref >39)
LDL CALC: 86 mg/dL (ref 0–99)
TRIGLYCERIDES: 123 mg/dL (ref 0–149)
VLDL CHOLESTEROL CAL: 25 mg/dL (ref 5–40)

## 2018-02-10 DIAGNOSIS — H40003 Preglaucoma, unspecified, bilateral: Secondary | ICD-10-CM | POA: Diagnosis not present

## 2018-02-12 ENCOUNTER — Ambulatory Visit (INDEPENDENT_AMBULATORY_CARE_PROVIDER_SITE_OTHER): Payer: PPO | Admitting: Internal Medicine

## 2018-02-12 ENCOUNTER — Encounter: Payer: Self-pay | Admitting: Internal Medicine

## 2018-02-12 VITALS — BP 102/62 | HR 57 | Temp 97.8°F | Resp 16 | Ht 60.0 in | Wt 144.0 lb

## 2018-02-12 DIAGNOSIS — F331 Major depressive disorder, recurrent, moderate: Secondary | ICD-10-CM

## 2018-02-12 DIAGNOSIS — L578 Other skin changes due to chronic exposure to nonionizing radiation: Secondary | ICD-10-CM | POA: Diagnosis not present

## 2018-02-12 DIAGNOSIS — Z86018 Personal history of other benign neoplasm: Secondary | ICD-10-CM | POA: Diagnosis not present

## 2018-02-12 DIAGNOSIS — R350 Frequency of micturition: Secondary | ICD-10-CM | POA: Diagnosis not present

## 2018-02-12 DIAGNOSIS — L570 Actinic keratosis: Secondary | ICD-10-CM | POA: Diagnosis not present

## 2018-02-12 LAB — POCT URINALYSIS DIPSTICK
BILIRUBIN UA: NEGATIVE
Glucose, UA: NEGATIVE
KETONES UA: NEGATIVE
Leukocytes, UA: NEGATIVE
Nitrite, UA: NEGATIVE
PH UA: 6 (ref 5.0–8.0)
Protein, UA: NEGATIVE
UROBILINOGEN UA: 0.2 U/dL

## 2018-02-12 MED ORDER — CIPROFLOXACIN HCL 250 MG PO TABS: 250.0000 mg | ORAL_TABLET | Freq: Two times a day (BID) | ORAL | 0 refills | Status: AC

## 2018-02-12 MED ORDER — CITALOPRAM HYDROBROMIDE 10 MG PO TABS: 10.0000 mg | ORAL_TABLET | Freq: Every day | ORAL | 5 refills | Status: DC

## 2018-02-12 NOTE — Progress Notes (Signed)
Date:  02/12/2018   Name:  Caroline Conway   DOB:  1941-08-27   MRN:  [id number]   Chief Complaint: Depression and Urinary Frequency Depression         This is a new problem.  The current episode started more than 1 month ago.   The problem has been gradually worsening since onset.  Associated symptoms include insomnia, decreased interest and sad.  Associated symptoms include no fatigue, no headaches and no suicidal ideas.     The symptoms are aggravated by social issues (feeling more isolated -). Urinary Frequency   This is a new problem. The current episode started in the past 7 days. The problem occurs every urination. The problem has been unchanged. The pain is mild. There has been no fever. Associated symptoms include frequency. Pertinent negatives include no chills or hematuria.     Review of Systems  Constitutional: Negative for chills, fatigue and fever.  Respiratory: Negative for chest tightness and shortness of breath.   Cardiovascular: Negative for chest pain and palpitations.  Genitourinary: Positive for frequency. Negative for hematuria.  Musculoskeletal: Positive for arthralgias, back pain and gait problem.  Neurological: Negative for dizziness and headaches.  Hematological: Negative for adenopathy.  Psychiatric/Behavioral: Positive for depression, dysphoric mood and sleep disturbance. Negative for confusion and suicidal ideas. The patient has insomnia. The patient is not nervous/anxious.     Patient Active Problem List   Diagnosis Date Noted  . Abnormal feces   . Benign neoplasm of transverse colon   . Esophageal dysphagia   . Stricture and stenosis of esophagus   . Abnormal CXR 03/21/2017  . Chronic renal insufficiency, stage 3 (moderate) ([place]) 03/19/2017  . Venous insufficiency of both lower extremities 02/13/2017  . Herpes simplex infection 07/07/2015  . Anxiety disorder 01/29/2015  . Atrophic vaginitis 01/29/2015  . Dyslipidemia 01/29/2015  . Essential  (primary) hypertension 01/29/2015  . Acid reflux 01/29/2015  . Idiopathic peripheral neuropathy 01/29/2015  . Idiopathic insomnia 01/29/2015  . Rheumatoid arthritis involving multiple joints ([place]) 01/29/2015  . Urge incontinence 01/29/2015  . Pelvic relaxation due to vaginal prolapse 01/29/2015    Prior to Admission medications   Medication Sig Start Date End Date Taking? Authorizing Provider  atenolol (TENORMIN) 50 MG tablet TAKE 1 TABLET BY MOUTH DAILY 10/15/17  Yes Glean Hess, MD  Biotin 5000 MCG CAPS Take by mouth.   Yes [provider]  esomeprazole (NEXIUM) 20 MG capsule Take 1 capsule by mouth daily.   Yes [provider]  famotidine (PEPCID) 20 MG tablet TAKE 1 TABLET BY MOUTH EVERYDAY AT BEDTIME 10/30/17  Yes Glean Hess, MD  folic acid (FOLVITE) 1 MG tablet Take 2 tablets by mouth daily.    Yes [provider]  hydrochlorothiazide (HYDRODIURIL) 25 MG tablet TAKE 2 TABLETS BY MOUTH EVERY DAY 01/05/18  Yes Glean Hess, MD  methotrexate 2.5 MG tablet Take 5 tablets by mouth once a week.    Yes [provider]  PREMARIN vaginal cream INSERT 1 APPLICATORFUL VAGINALLY AT BEDTIME 07/03/17  Yes Glean Hess, MD  simvastatin (ZOCOR) 20 MG tablet Take 1 tablet (20 mg total) by mouth daily. 01/17/18  Yes Glean Hess, MD  trimethoprim (TRIMPEX) 100 MG tablet Take 1 tablet (100 mg total) by mouth daily. 12/09/17  Yes MacDiarmid, Nicki Reaper, MD  zolpidem (AMBIEN) 5 MG tablet Take 1 tablet (5 mg total) by mouth at bedtime. 01/17/18  Yes Glean Hess, MD  acyclovir (ZOVIRAX) 200 MG capsule TAKE 1 CAPSULE (200 MG TOTAL) BY MOUTH 2 (TWO) TIMES DAILY. Patient not taking: Reported on 01/17/2018 07/18/17   Glean Hess, MD    Allergies  Allergen Reactions  . Codeine Sulfate Hives  . Oxycodone Itching  . Propoxyphene Hives  . Doxycycline Other (See Comments)    Headache  . Mirtazapine Swelling  . Penicillins Rash  . Sulfa Antibiotics  Rash    Past Surgical History:  Procedure Laterality Date  . ANTERIOR AND POSTERIOR VAGINAL REPAIR  04/2015  . APPENDECTOMY  1960  . BREAST CYST ASPIRATION Left    lt fna- neg  . COLONOSCOPY WITH PROPOFOL N/A 11/01/2017   Procedure: COLONOSCOPY WITH PROPOFOL;  Surgeon: Lucilla Lame, MD;  Location: [place];  Service: Endoscopy;  Laterality: N/A;  . ESOPHAGEAL DILATION  11/01/2017   Procedure: ESOPHAGEAL DILATION;  Surgeon: Lucilla Lame, MD;  Location: [place];  Service: Endoscopy;;  . ESOPHAGOGASTRODUODENOSCOPY (EGD) WITH PROPOFOL N/A 11/01/2017   Procedure: ESOPHAGOGASTRODUODENOSCOPY (EGD) WITH PROPOFOL;  Surgeon: Lucilla Lame, MD;  Location: [place];  Service: Endoscopy;  Laterality: N/A;  . LUMBAR [place] SURGERY  2011  . POLYPECTOMY  11/01/2017   Procedure: POLYPECTOMY;  Surgeon: Lucilla Lame, MD;  Location: [place];  Service: Endoscopy;;  . riight shoulder surgery Right 1999  . SHOULDER SURGERY Left 2006  . TOTAL KNEE ARTHROPLASTY Left 2011  . VAGINAL HYSTERECTOMY  04/2015    Social History   Tobacco Use  . Smoking status: Never Smoker  . Smokeless tobacco: Never Used  Substance Use Topics  . Alcohol use: No    Alcohol/week: 0.0 oz  . Drug use: No     Medication list has been reviewed and updated.  Current Meds  Medication Sig  . atenolol (TENORMIN) 50 MG tablet TAKE 1 TABLET BY MOUTH DAILY  . Biotin 5000 MCG CAPS Take by mouth.  . esomeprazole (NEXIUM) 20 MG capsule Take 1 capsule by mouth daily.  . famotidine (PEPCID) 20 MG tablet TAKE 1 TABLET BY MOUTH EVERYDAY AT BEDTIME  . folic acid (FOLVITE) 1 MG tablet Take 2 tablets by mouth daily.   . hydrochlorothiazide (HYDRODIURIL) 25 MG tablet TAKE 2 TABLETS BY MOUTH EVERY DAY  . methotrexate 2.5 MG tablet Take 5 tablets by mouth once a week.   Marland Kitchen PREMARIN vaginal cream INSERT 1 APPLICATORFUL VAGINALLY AT BEDTIME  . simvastatin (ZOCOR) 20 MG tablet Take 1 tablet (20 mg total) by  mouth daily.  Marland Kitchen trimethoprim (TRIMPEX) 100 MG tablet Take 1 tablet (100 mg total) by mouth daily.  Marland Kitchen zolpidem (AMBIEN) 5 MG tablet Take 1 tablet (5 mg total) by mouth at bedtime.    PHQ 2/9 Scores 02/12/2018 01/17/2018 09/11/2017 04/01/2017  PHQ - 2 Score 3 0 0 1  PHQ- 9 Score 7 - 0 -    Physical Exam  Constitutional: She is oriented to person, place, and time. She appears well-developed. No distress.  HENT:  Head: Normocephalic and atraumatic.  Neck: Normal range of motion. Neck supple.  Cardiovascular: Normal rate, regular rhythm and normal heart sounds.  Pulmonary/Chest: Effort normal and breath sounds normal. No respiratory distress.  Abdominal: Soft. Bowel sounds are normal.  Musculoskeletal: Normal range of motion.  Neurological: She is alert and oriented to person, place, and time.  Skin: Skin is warm and dry. No rash noted.  Psychiatric: She has a normal mood and affect. Her speech is normal and behavior is normal. Thought content normal.  Nursing note and vitals reviewed.   BP 102/62   Pulse (!) 57   Temp 97.8 F (36.6 C) (Oral)   Resp 16   Ht 5' (1.524 m)   Wt 144 lb (65.3 kg)   SpO2 99%   BMI 28.12 kg/m   Assessment and Plan: 1. Urinary frequency Treat presumptively - consider urine culture if no improvement - POCT Urinalysis Dipstick - ciprofloxacin (CIPRO) 250 MG tablet; Take 1 tablet (250 mg total) by mouth 2 (two) times daily for 7 days.  Dispense: 14 tablet; Refill: 0  2. Moderate episode of recurrent major depressive disorder ([place]) Begin medication Continue volunteer activities, exercise at the gym, church acitivities, etc Follow up 6 weeks - citalopram (CELEXA) 10 MG tablet; Take 1 tablet (10 mg total) by mouth daily.  Dispense: 30 tablet; Refill: 5   Meds ordered this encounter  Medications  . citalopram (CELEXA) 10 MG tablet    Sig: Take 1 tablet (10 mg total) by mouth daily.    Dispense:  30 tablet    Refill:  5  . ciprofloxacin (CIPRO) 250 MG  tablet    Sig: Take 1 tablet (250 mg total) by mouth 2 (two) times daily for 7 days.    Dispense:  14 tablet    Refill:  0    Partially dictated using Editor, commissioning. Any errors are unintentional.  Halina Maidens, MD [facility]  02/12/2018   There are no diagnoses linked to this encounter.

## 2018-02-18 DIAGNOSIS — H40003 Preglaucoma, unspecified, bilateral: Secondary | ICD-10-CM | POA: Diagnosis not present

## 2018-02-20 ENCOUNTER — Telehealth: Payer: Self-pay

## 2018-02-21 ENCOUNTER — Encounter: Payer: Self-pay | Admitting: Gynecology

## 2018-02-21 ENCOUNTER — Ambulatory Visit
Admission: EM | Admit: 2018-02-21 | Discharge: 2018-02-21 | Disposition: A | Discharge status: Home / Self Care | Payer: PPO | Attending: Registered Nurse | Admitting: Registered Nurse

## 2018-02-21 DIAGNOSIS — N3091 Cystitis, unspecified with hematuria: Secondary | ICD-10-CM | POA: Diagnosis not present

## 2018-02-21 LAB — URINALYSIS, COMPLETE (UACMP) WITH MICROSCOPIC
Bilirubin Urine: NEGATIVE
GLUCOSE, UA: NEGATIVE mg/dL
KETONES UR: NEGATIVE mg/dL
Leukocytes, UA: NEGATIVE
Nitrite: NEGATIVE
PROTEIN: NEGATIVE mg/dL
Specific Gravity, Urine: 1.02 (ref 1.005–1.030)
pH: 7 (ref 5.0–8.0)

## 2018-02-21 MED ORDER — PHENAZOPYRIDINE HCL 200 MG PO TABS: 200.0000 mg | ORAL_TABLET | Freq: Three times a day (TID) | ORAL | 0 refills | Status: DC

## 2018-02-21 NOTE — Discharge Instructions (Signed)
Hydrate to keep urine clear yellow tinged or voiding every 4-6 hours regularly Do not hold urine Change incontinence pad when wet Take pyridium every 8 hours Will call with urine culture results when available 2-3 days

## 2018-02-21 NOTE — ED Triage Notes (Signed)
Per patient x over 1 week frequency urination and urgency to urinate. Per patient was see by her primary care x 1 week ago for the same symptoms and was given antibiotic. Per patient not better.

## 2018-02-21 NOTE — ED Provider Notes (Signed)
[facility]    CSN: [id number] Arrival date & time: 02/21/18  1149     History   Chief Complaint Chief Complaint  Patient presents with  . Urinary Tract Infection    HPI Caroline Conway is a 77 y.o. female.   77y/o caucasian female with urinary frequency for greater than 1 week, headache today she thinks from not sleeping or eating well took a cystex this am for frequency.  Macrobid gives her diarrhea.  Finished complete course of ciprofloxacin.  Denied swelling hands or feet.  Left hip pain wearing salon pas patch.  Hx stress incontinence wears pad changes prn.  Hasn't been going to gym since not sleeping well, hip hurting and feeling blah.  Hysterectomy with bladder mesh per patient had frequent UTIs after surgery but this winter better than usual had not had UTI until last week--6 months     Past Medical History:  Diagnosis Date  . Cancer ([place])    skin ca  . Collagen vascular disease (HCC)    RA  . Depression, major, single episode, in partial remission ([place]) 01/29/2015  . Dyspnea   . H/O total hysterectomy   . Heartburn   . HLD (hyperlipidemia)   . Hypertension   . Neuromuscular disorder (HCC)    neuropathy in Left lower leg and foot  . Neuropathy   . Rheumatoid arteritis     Patient Active Problem List   Diagnosis Date Noted  . Abnormal feces   . Benign neoplasm of transverse colon   . Esophageal dysphagia   . Stricture and stenosis of esophagus   . Abnormal CXR 03/21/2017  . Chronic renal insufficiency, stage 3 (moderate) ([place]) 03/19/2017  . Venous insufficiency of both lower extremities 02/13/2017  . Herpes simplex infection 07/07/2015  . Anxiety disorder 01/29/2015  . Atrophic vaginitis 01/29/2015  . Dyslipidemia 01/29/2015  . Essential (primary) hypertension 01/29/2015  . Acid reflux 01/29/2015  . Idiopathic peripheral neuropathy 01/29/2015  . Idiopathic insomnia 01/29/2015  . Rheumatoid arthritis involving multiple joints ([place])  01/29/2015  . Urge incontinence 01/29/2015  . Pelvic relaxation due to vaginal prolapse 01/29/2015    Past Surgical History:  Procedure Laterality Date  . ANTERIOR AND POSTERIOR VAGINAL REPAIR  04/2015  . APPENDECTOMY  1960  . BREAST CYST ASPIRATION Left    lt fna- neg  . COLONOSCOPY WITH PROPOFOL N/A 11/01/2017   Procedure: COLONOSCOPY WITH PROPOFOL;  Surgeon: Lucilla Lame, MD;  Location: [place];  Service: Endoscopy;  Laterality: N/A;  . ESOPHAGEAL DILATION  11/01/2017   Procedure: ESOPHAGEAL DILATION;  Surgeon: Lucilla Lame, MD;  Location: [place];  Service: Endoscopy;;  . ESOPHAGOGASTRODUODENOSCOPY (EGD) WITH PROPOFOL N/A 11/01/2017   Procedure: ESOPHAGOGASTRODUODENOSCOPY (EGD) WITH PROPOFOL;  Surgeon: Lucilla Lame, MD;  Location: [place];  Service: Endoscopy;  Laterality: N/A;  . LUMBAR [place] SURGERY  2011  . POLYPECTOMY  11/01/2017   Procedure: POLYPECTOMY;  Surgeon: Lucilla Lame, MD;  Location: [place];  Service: Endoscopy;;  . riight shoulder surgery Right 1999  . SHOULDER SURGERY Left 2006  . TOTAL KNEE ARTHROPLASTY Left 2011  . VAGINAL HYSTERECTOMY  04/2015    OB History   None      Home Medications    Prior to Admission medications   Medication Sig Start Date End Date Taking? Authorizing Provider  atenolol (TENORMIN) 50 MG tablet TAKE 1 TABLET BY MOUTH DAILY 10/15/17  Yes Glean Hess, MD  Biotin 5000 MCG CAPS Take by  mouth.   Yes [provider]  esomeprazole (NEXIUM) 20 MG capsule Take 1 capsule by mouth daily.   Yes [provider]  famotidine (PEPCID) 20 MG tablet TAKE 1 TABLET BY MOUTH EVERYDAY AT BEDTIME 10/30/17  Yes Glean Hess, MD  folic acid (FOLVITE) 1 MG tablet Take 2 tablets by mouth daily.    Yes [provider]  hydrochlorothiazide (HYDRODIURIL) 25 MG tablet TAKE 2 TABLETS BY MOUTH EVERY DAY 01/05/18  Yes Glean Hess, MD  methotrexate 2.5 MG tablet Take 5 tablets by  mouth once a week.    Yes [provider]  PREMARIN vaginal cream INSERT 1 APPLICATORFUL VAGINALLY AT BEDTIME 07/03/17  Yes Glean Hess, MD  zolpidem (AMBIEN) 5 MG tablet Take 1 tablet (5 mg total) by mouth at bedtime. 01/17/18  Yes Glean Hess, MD  citalopram (CELEXA) 10 MG tablet Take 1 tablet (10 mg total) by mouth daily. 02/12/18   Glean Hess, MD  phenazopyridine (PYRIDIUM) 200 MG tablet Take 1 tablet (200 mg total) by mouth 3 (three) times daily. 02/21/18   Betancourt, Aura Fey, NP  simvastatin (ZOCOR) 20 MG tablet Take 1 tablet (20 mg total) by mouth daily. 01/17/18   Glean Hess, MD  trimethoprim (TRIMPEX) 100 MG tablet Take 1 tablet (100 mg total) by mouth daily. 12/09/17   Bjorn Loser, MD    Family History Family History  Problem Relation Age of Onset  . Alzheimer's disease Mother   . Lung cancer Father   . Stomach cancer Brother   . Prostate cancer Neg Hx   . Kidney cancer Neg Hx   . Bladder Cancer Neg Hx   . Breast cancer Neg Hx     Social History Social History   Tobacco Use  . Smoking status: Never Smoker  . Smokeless tobacco: Never Used  Substance Use Topics  . Alcohol use: No    Alcohol/week: 0.0 oz  . Drug use: No     Allergies   Codeine sulfate; Oxycodone; Propoxyphene; Doxycycline; Mirtazapine; Penicillins; and Sulfa antibiotics   Review of Systems Review of Systems  Constitutional: Positive for activity change and appetite change. Negative for chills, diaphoresis, fatigue and fever.  HENT: Negative for trouble swallowing and voice change.   Eyes: Negative for photophobia and visual disturbance.  Respiratory: Negative for cough, choking, shortness of breath, wheezing and stridor.   Cardiovascular: Negative for leg swelling.  Gastrointestinal: Positive for abdominal pain. Negative for abdominal distention, blood in stool, constipation, diarrhea, nausea and vomiting.  Endocrine: Negative for cold intolerance and heat  intolerance.  Genitourinary: Positive for frequency and urgency. Negative for decreased urine volume, difficulty urinating, dysuria, flank pain, hematuria, menstrual problem, vaginal bleeding, vaginal discharge and vaginal pain.  Musculoskeletal: Positive for back pain, gait problem and myalgias. Negative for joint swelling, neck pain and neck stiffness.  Skin: Negative for color change, pallor, rash and wound.  Allergic/Immunologic: Negative for food allergies.  Neurological: Negative for dizziness, tremors, seizures, syncope, facial asymmetry, speech difficulty, weakness, light-headedness, numbness and headaches.  Hematological: Negative for adenopathy. Does not bruise/bleed easily.  Psychiatric/Behavioral: Positive for sleep disturbance. Negative for agitation and confusion.     Physical Exam Triage Vital Signs ED Triage Vitals  Enc Vitals Group     BP 02/21/18 1214 133/75     Pulse Rate 02/21/18 1214 (!) 56     Resp 02/21/18 1214 16     Temp 02/21/18 1214 98 F (36.7 C)  Temp Source 02/21/18 1214 Oral     SpO2 02/21/18 1214 100 %     Weight 02/21/18 1216 144 lb (65.3 kg)     Height --      Head Circumference --      Peak Flow --      Pain Score 02/21/18 1216 7     Pain Loc --      Pain Edu? --      Excl. in [place]? --    No data found.  Updated Vital Signs BP 133/75 (BP Location: Left Arm)   Pulse (!) 56   Temp 98 F (36.7 C) (Oral)   Resp 16   Wt 144 lb (65.3 kg)   SpO2 100%   BMI 28.12 kg/m    Physical Exam  Constitutional: She is oriented to person, place, and time. Vital signs are normal. She appears well-developed and well-nourished. She is active and cooperative.  Non-toxic appearance. She does not have a sickly appearance. She does not appear ill. No distress.  HENT:  Head: Normocephalic and atraumatic.  Right Ear: Hearing, external ear and ear canal normal. A middle ear effusion is present.  Left Ear: Hearing, external ear and ear canal normal. A middle  ear effusion is present.  Nose: No mucosal edema, rhinorrhea, nose lacerations, sinus tenderness, nasal deformity, septal deviation or nasal septal hematoma. No epistaxis.  No foreign bodies. Right sinus exhibits no maxillary sinus tenderness and no frontal sinus tenderness. Left sinus exhibits no maxillary sinus tenderness and no frontal sinus tenderness.  Mouth/Throat: Uvula is midline and mucous membranes are normal. Mucous membranes are not pale, not dry and not cyanotic. She does not have dentures. No oral lesions. No trismus in the jaw. Normal dentition. No dental abscesses, uvula swelling, lacerations or dental caries. Posterior oropharyngeal edema and posterior oropharyngeal erythema present. No oropharyngeal exudate or tonsillar abscesses. No tonsillar exudate.  Bilateral TMs air fluid level clear; bilateral allergic shiners; cobblestoning posterior pharynx  Eyes: Pupils are equal, round, and reactive to light. Conjunctivae, EOM and lids are normal. Right eye exhibits no chemosis, no discharge, no exudate and no hordeolum. No foreign body present in the right eye. Left eye exhibits no chemosis, no discharge, no exudate and no hordeolum. No foreign body present in the left eye. Right conjunctiva is not injected. Right conjunctiva has no hemorrhage. Left conjunctiva is not injected. Left conjunctiva has no hemorrhage. No scleral icterus. Right eye exhibits normal extraocular motion and no nystagmus. Left eye exhibits normal extraocular motion and no nystagmus. Right pupil is round and reactive. Left pupil is round and reactive. Pupils are equal.  Neck: Trachea normal, normal range of motion and phonation normal. Neck supple. No tracheal tenderness, no spinous process tenderness and no muscular tenderness present. No neck rigidity. No tracheal deviation, no edema, no erythema and normal range of motion present. No thyroid mass and no thyromegaly present.  Cardiovascular: Normal rate, regular rhythm, S1  normal, S2 normal, normal heart sounds and intact distal pulses. PMI is not displaced. Exam reveals no gallop, no distant heart sounds and no friction rub.  No murmur heard. Pulses:      Dorsalis pedis pulses are 2+ on the right side, and 2+ on the left side.  Pulmonary/Chest: Effort normal and breath sounds normal. No accessory muscle usage or stridor. No respiratory distress. She has no decreased breath sounds. She has no wheezes. She has no rhonchi. She has no rales. She exhibits no tenderness.  No cough observed  in exam room; spoke full sentences without difficulty  Abdominal: Soft. Normal appearance. She exhibits no shifting dullness, no distension, no pulsatile liver, no fluid wave, no abdominal bruit, no ascites, no pulsatile midline mass and no mass. Bowel sounds are decreased. There is tenderness. There is no rigidity, no rebound, no guarding, no CVA tenderness, no tenderness at McBurney's point and negative Murphy's sign. No hernia. Hernia confirmed negative in the ventral area.    Dull to percussion x 4 quads; hypoactive bowel sounds x 4 quads  Musculoskeletal: She exhibits no edema.       Right shoulder: Normal.       Left shoulder: Normal.       Right elbow: Normal.      Left elbow: Normal.       Right hip: Normal.       Left hip: She exhibits decreased range of motion and tenderness. She exhibits normal strength, no bony tenderness, no swelling, no crepitus, no deformity and no laceration.       Right knee: Normal.       Left knee: Normal.       Cervical back: Normal.       Thoracic back: Normal.       Lumbar back: Normal.       Right hand: Normal.       Left hand: Normal.       Legs: Left hip pain with patch transdermal in place walking with cane  Lymphadenopathy:       Head (right side): No submental, no submandibular, no tonsillar, no preauricular, no posterior auricular and no occipital adenopathy present.       Head (left side): No submental, no submandibular, no  tonsillar, no preauricular, no posterior auricular and no occipital adenopathy present.    She has no cervical adenopathy.       Right cervical: No superficial cervical, no deep cervical and no posterior cervical adenopathy present.      Left cervical: No superficial cervical, no deep cervical and no posterior cervical adenopathy present.  Neurological: She is alert and oriented to person, place, and time. She has normal strength. She is not disoriented. She displays no atrophy and no tremor. No cranial nerve deficit or sensory deficit. She exhibits normal muscle tone. She displays no seizure activity. Coordination and gait normal. GCS eye subscore is 4. GCS verbal subscore is 5. GCS motor subscore is 6.  Gait sure and steady with cane in hallway to bathroom; on/off exam table and in/out of chair slow with standing to sitting and reverse; supine to sitting normal  Skin: Skin is warm, dry and intact. Capillary refill takes less than 2 seconds. No abrasion, no bruising, no burn, no ecchymosis, no laceration, no lesion, no petechiae and no rash noted. She is not diaphoretic. No cyanosis or erythema. No pallor. Nails show no clubbing.  Psychiatric: She has a normal mood and affect. Her speech is normal and behavior is normal. Judgment and thought content normal. She is not actively hallucinating. Cognition and memory are normal. She is attentive.  Nursing note and vitals reviewed.    UC Treatments / Results  Labs (all labs ordered are listed, but only abnormal results are displayed) Labs Reviewed  URINALYSIS, COMPLETE (UACMP) WITH MICROSCOPIC - Abnormal; Notable for the following components:      Result Value   Hgb urine dipstick SMALL (*)    Bacteria, UA FEW (*)    All other components within normal limits  URINE CULTURE  EKG None  Radiology No results found.  Procedures Procedures (including critical care time)  Medications Ordered in UC Medications - No data to display  Initial  Impression / Assessment and Plan / UC Course  I have reviewed the triage vital signs and the nursing notes.  Pertinent labs & imaging results that were available during my care of the patient were reviewed by me and considered in my medical decision making (see chart for details).    discussed with patient this could be cystitis/urethritis.  Denied dietary changes except decreased appetite/not eating as much.  Discussed change urinary incontinence pad frequently to avoid urethra/skin irritation after incontinence stress.  Urinary sample clean catch to lab and pending urine culture gold clear small sample.  Patient drinking water in exam room 531ml bottle.  Patient notified urinalysis with RBC/blood but otherwise normal.  Will await urine culture results and given Rx for pyridium 200mg  po TID prn frequency/urgency.  Hydrate to keep urine clear yellow tinged not gold.  Discussed pyridium may cause urine to be orange/gold.  Drink at least 3-526ml bottles water per day if heat index close to 100 at least 4 bottles.  Medications as directed.  Patient is also to push fluids and may use Pyridium 200mg  po TID as needed.  Hydrate, avoid dehydration.  Avoid holding urine void on frequent basis every 4 to 6 hours.  If unable to void every 8 hours follow up for re-evaluation with PCM, urgent care or ER.   Call or return to clinic as needed if these symptoms worsen or fail to improve as anticipated.  Exitcare handout on hematuria and cystitis given to patient Patient verbalized agreement and understanding of treatment plan and had no further questions at this time. Final Clinical Impressions(s) / UC Diagnoses   Final diagnoses:  Cystitis with hematuria     Discharge Instructions     Hydrate to keep urine clear yellow tinged or voiding every 4-6 hours regularly Do not hold urine Change incontinence pad when wet Take pyridium every 8 hours Will call with urine culture results when available 2-3 days    ED  Prescriptions    Medication Sig Dispense Auth. Provider   phenazopyridine (PYRIDIUM) 200 MG tablet Take 1 tablet (200 mg total) by mouth 3 (three) times daily. 6 tablet Betancourt, Aura Fey, NP     Controlled Substance Prescriptions [place] Controlled Substance Registry consulted? No   Olen Cordial, NP 02/21/18 1323

## 2018-02-23 LAB — URINE CULTURE: CULTURE: NO GROWTH

## 2018-02-24 ENCOUNTER — Telehealth: Payer: Self-pay | Admitting: Registered Nurse

## 2018-02-24 ENCOUNTER — Encounter: Payer: Self-pay | Admitting: Registered Nurse

## 2018-02-24 NOTE — Telephone Encounter (Signed)
Patient contacted and notified urine culture normal/negative.  Patient reported she did not use pyridium and stopped cystex. Patient reported urgency and frequency decreased same day as appt only had to get up twice that night and slept much better.  Patient denied fever/chills/hematuria/belly or back pain.  Encouraged continued hydration to keep urine pale clear yellow tinged.  Patient verbalized understanding of information/instructions, agreed with plan of care and had no further questions at this time.

## 2018-03-03 NOTE — Telephone Encounter (Signed)
Error

## 2018-03-06 ENCOUNTER — Ambulatory Visit: Payer: PPO | Admitting: Internal Medicine

## 2018-03-06 ENCOUNTER — Other Ambulatory Visit: Payer: Self-pay | Admitting: Internal Medicine

## 2018-03-06 DIAGNOSIS — F331 Major depressive disorder, recurrent, moderate: Secondary | ICD-10-CM

## 2018-03-08 ENCOUNTER — Other Ambulatory Visit: Payer: Self-pay | Admitting: Internal Medicine

## 2018-03-08 DIAGNOSIS — F5101 Primary insomnia: Secondary | ICD-10-CM

## 2018-03-10 NOTE — Telephone Encounter (Signed)
Spoke with [facility] Pharmacist and they states she has enough refills and the system did an automated request.

## 2018-03-10 NOTE — Telephone Encounter (Signed)
Please call the pharmacy.  I sent Rx on 01/17/18 with 5 refills.

## 2018-03-25 ENCOUNTER — Ambulatory Visit (INDEPENDENT_AMBULATORY_CARE_PROVIDER_SITE_OTHER): Payer: PPO | Admitting: Internal Medicine

## 2018-03-25 ENCOUNTER — Encounter: Payer: Self-pay | Admitting: Internal Medicine

## 2018-03-25 VITALS — BP 110/78 | HR 57 | Ht 60.0 in | Wt 148.0 lb

## 2018-03-25 DIAGNOSIS — F321 Major depressive disorder, single episode, moderate: Secondary | ICD-10-CM

## 2018-03-25 MED ORDER — MIRTAZAPINE 30 MG PO TABS: 30.0000 mg | ORAL_TABLET | Freq: Every day | ORAL | 5 refills | Status: DC

## 2018-03-25 MED ORDER — DULOXETINE HCL 20 MG PO CPEP: 20.0000 mg | ORAL_CAPSULE | Freq: Every day | ORAL | 5 refills | Status: DC

## 2018-03-25 NOTE — Progress Notes (Signed)
Date:  03/25/2018   Name:  Caroline Conway   DOB:  [birth year]   MRN:  [id number]   Chief Complaint: Depression (Changed back to old medications of Cymbalta and Remeron. Added them back to medication list. Patient said she took Celexa for 2 weeks and went 5 nights without sleeping and had to stop taking medication. Fell into a deep depression and had to start back on other meds again. PHQ9 - 1 ) Depression         This is a new problem.  The current episode started more than 1 month ago.   The onset quality is undetermined.   The problem has been gradually improving since onset.  Associated symptoms include appetite change.  Associated symptoms include no fatigue and no headaches.  Past treatments include SSRIs - Selective serotonin reuptake inhibitors (did not tolerate celexa so went back to Cymbalta and Remeron). Now sleeptng well and feeling good.  Much more interactive and interested in usual activities.  Appetite has improved and she has gained a few pounds.    Review of Systems  Constitutional: Positive for appetite change. Negative for chills, fatigue, fever and unexpected weight change.  Respiratory: Negative for chest tightness, shortness of breath and wheezing.   Cardiovascular: Positive for leg swelling (minimal).  Gastrointestinal: Negative for constipation and diarrhea.  Neurological: Negative for dizziness and headaches.  Psychiatric/Behavioral: Positive for depression. Negative for dysphoric mood and sleep disturbance. The patient is not nervous/anxious.     Patient Active Problem List   Diagnosis Date Noted  . Abnormal feces   . Benign neoplasm of transverse colon   . Esophageal dysphagia   . Stricture and stenosis of esophagus   . Abnormal CXR 03/21/2017  . Chronic renal insufficiency, stage 3 (moderate) ([place]) 03/19/2017  . Venous insufficiency of both lower extremities 02/13/2017  . Herpes simplex infection 07/07/2015  . Anxiety disorder 01/29/2015  . Atrophic  vaginitis 01/29/2015  . Dyslipidemia 01/29/2015  . Essential (primary) hypertension 01/29/2015  . Acid reflux 01/29/2015  . Idiopathic peripheral neuropathy 01/29/2015  . Idiopathic insomnia 01/29/2015  . Rheumatoid arthritis involving multiple joints ([place]) 01/29/2015  . Urge incontinence 01/29/2015  . Pelvic relaxation due to vaginal prolapse 01/29/2015    Prior to Admission medications   Medication Sig Start Date End Date Taking? Authorizing Provider  atenolol (TENORMIN) 50 MG tablet TAKE 1 TABLET BY MOUTH DAILY 10/15/17   Glean Hess, MD  Biotin 5000 MCG CAPS Take by mouth.    [provider]       Glean Hess, MD  esomeprazole (NEXIUM) 20 MG capsule Take 1 capsule by mouth daily.    [provider]  famotidine (PEPCID) 20 MG tablet TAKE 1 TABLET BY MOUTH EVERYDAY AT BEDTIME 10/30/17   Glean Hess, MD  folic acid (FOLVITE) 1 MG tablet Take 2 tablets by mouth daily.     [provider]  hydrochlorothiazide (HYDRODIURIL) 25 MG tablet TAKE 2 TABLETS BY MOUTH EVERY DAY 01/05/18   Glean Hess, MD  methotrexate 2.5 MG tablet Take 5 tablets by mouth once a week.     [provider]  phenazopyridine (PYRIDIUM) 200 MG tablet Take 1 tablet (200 mg total) by mouth 3 (three) times daily. 02/21/18   Betancourt, Aura Fey, NP  PREMARIN vaginal cream INSERT 1 APPLICATORFUL VAGINALLY AT BEDTIME 07/03/17   Glean Hess, MD  simvastatin (ZOCOR) 20 MG tablet Take 1 tablet (20 mg total) by mouth daily.  01/17/18   Glean Hess, MD  trimethoprim (TRIMPEX) 100 MG tablet Take 1 tablet (100 mg total) by mouth daily. 12/09/17   MacDiarmid, Nicki Reaper, MD  zolpidem (AMBIEN) 5 MG tablet Take 1 tablet (5 mg total) by mouth at bedtime. 01/17/18   Glean Hess, MD    Allergies  Allergen Reactions  . Codeine Sulfate Hives  . Oxycodone Itching  . Propoxyphene Hives  . Doxycycline Other (See Comments)    Headache  . Mirtazapine Swelling  . Penicillins  Rash  . Sulfa Antibiotics Rash    Past Surgical History:  Procedure Laterality Date  . ANTERIOR AND POSTERIOR VAGINAL REPAIR  04/2015  . APPENDECTOMY  1960  . BREAST CYST ASPIRATION Left    lt fna- neg  . COLONOSCOPY WITH PROPOFOL N/A 11/01/2017   Procedure: COLONOSCOPY WITH PROPOFOL;  Surgeon: Lucilla Lame, MD;  Location: [facility];  Service: Endoscopy;  Laterality: N/A;  . ESOPHAGEAL DILATION  11/01/2017   Procedure: ESOPHAGEAL DILATION;  Surgeon: Lucilla Lame, MD;  Location: [facility];  Service: Endoscopy;;  . ESOPHAGOGASTRODUODENOSCOPY (EGD) WITH PROPOFOL N/A 11/01/2017   Procedure: ESOPHAGOGASTRODUODENOSCOPY (EGD) WITH PROPOFOL;  Surgeon: Lucilla Lame, MD;  Location: [facility];  Service: Endoscopy;  Laterality: N/A;  . LUMBAR [place] SURGERY  2011  . POLYPECTOMY  11/01/2017   Procedure: POLYPECTOMY;  Surgeon: Lucilla Lame, MD;  Location: [facility];  Service: Endoscopy;;  . riight shoulder surgery Right 1999  . SHOULDER SURGERY Left 2006  . TOTAL KNEE ARTHROPLASTY Left 2011  . VAGINAL HYSTERECTOMY  04/2015    Social History   Tobacco Use  . Smoking status: Never Smoker  . Smokeless tobacco: Never Used  Substance Use Topics  . Alcohol use: No    Alcohol/week: 0.0 oz  . Drug use: No     Medication list has been reviewed and updated.  Current Meds  Medication Sig  . atenolol (TENORMIN) 50 MG tablet TAKE 1 TABLET BY MOUTH DAILY  . Biotin 5000 MCG CAPS Take by mouth.  . DULoxetine (CYMBALTA) 20 MG capsule Take 20 mg by mouth daily.  Marland Kitchen esomeprazole (NEXIUM) 20 MG capsule Take 1 capsule by mouth daily.  . famotidine (PEPCID) 20 MG tablet TAKE 1 TABLET BY MOUTH EVERYDAY AT BEDTIME  . folic acid (FOLVITE) 1 MG tablet Take 2 tablets by mouth daily.   . hydrochlorothiazide (HYDRODIURIL) 25 MG tablet TAKE 2 TABLETS BY MOUTH EVERY DAY  . methotrexate 2.5 MG tablet Take 5 tablets by mouth once a week.   . mirtazapine (REMERON) 30 MG tablet Take 30  mg by mouth at bedtime.  . phenazopyridine (PYRIDIUM) 200 MG tablet Take 1 tablet (200 mg total) by mouth 3 (three) times daily.  Marland Kitchen PREMARIN vaginal cream INSERT 1 APPLICATORFUL VAGINALLY AT BEDTIME  . simvastatin (ZOCOR) 20 MG tablet Take 1 tablet (20 mg total) by mouth daily.  Marland Kitchen trimethoprim (TRIMPEX) 100 MG tablet Take 1 tablet (100 mg total) by mouth daily.  Marland Kitchen zolpidem (AMBIEN) 5 MG tablet Take 1 tablet (5 mg total) by mouth at bedtime.    PHQ 2/9 Scores 03/25/2018 02/12/2018 01/17/2018 09/11/2017  PHQ - 2 Score 1 3 0 0  PHQ- 9 Score 1 7 - 0    Physical Exam  Constitutional: She is oriented to person, place, and time. She appears well-developed. No distress.  HENT:  Head: Normocephalic and atraumatic.  Cardiovascular: Normal rate, regular rhythm and normal heart sounds.  Pulmonary/Chest: Effort normal and breath sounds  normal. No respiratory distress.  Musculoskeletal: Normal range of motion.  Neurological: She is alert and oriented to person, place, and time.  Skin: Skin is warm and dry. No rash noted.  Psychiatric: She has a normal mood and affect. Her speech is normal and behavior is normal. Thought content normal. Her mood appears not anxious. Cognition and memory are normal. She does not exhibit a depressed mood. She expresses no suicidal ideation.  Nursing note and vitals reviewed.   BP 110/78   Pulse (!) 57   Ht 5' (1.524 m)   Wt 148 lb (67.1 kg)   SpO2 98%   BMI 28.90 kg/m   Assessment and Plan: 1. Current moderate episode of major depressive disorder without prior episode ([place]) Did not tolerate Celexa so resumed previous medication from late last year Doing very well with almost complete resolution of sx - mirtazapine (REMERON) 30 MG tablet; Take 1 tablet (30 mg total) by mouth at bedtime.  Dispense: 30 tablet; Refill: 5 - DULoxetine (CYMBALTA) 20 MG capsule; Take 1 capsule (20 mg total) by mouth daily.  Dispense: 30 capsule; Refill: 5   Meds ordered this encounter    Medications  . mirtazapine (REMERON) 30 MG tablet    Sig: Take 1 tablet (30 mg total) by mouth at bedtime.    Dispense:  30 tablet    Refill:  5  . DULoxetine (CYMBALTA) 20 MG capsule    Sig: Take 1 capsule (20 mg total) by mouth daily.    Dispense:  30 capsule    Refill:  5    Partially dictated using Editor, commissioning. Any errors are unintentional.  Halina Maidens, MD [facility]  03/25/2018   There are no diagnoses linked to this encounter.

## 2018-04-02 ENCOUNTER — Ambulatory Visit (INDEPENDENT_AMBULATORY_CARE_PROVIDER_SITE_OTHER): Payer: PPO

## 2018-04-02 VITALS — BP 110/62 | HR 57 | Temp 97.8°F | Resp 12 | Ht 60.0 in | Wt 144.8 lb

## 2018-04-02 DIAGNOSIS — Z0000 Encounter for general adult medical examination without abnormal findings: Secondary | ICD-10-CM | POA: Diagnosis not present

## 2018-04-02 NOTE — Progress Notes (Signed)
Subjective:   Caroline Conway is a 77 y.o. female who presents for Medicare Annual (Subsequent) preventive examination.  Review of Systems:  N/A Cardiac Risk Factors include: sedentary lifestyle;advanced age (>40men, >43 women);dyslipidemia;hypertension     Objective:     Vitals: BP 110/62 (BP Location: Right Arm, Patient Position: Sitting, Cuff Size: Normal)   Pulse (!) 57   Temp 97.8 F (36.6 C) (Oral)   Resp 12   Ht 5' (1.524 m)   Wt 144 lb 12.8 oz (65.7 kg)   SpO2 99%   BMI 28.28 kg/m   Body mass index is 28.28 kg/m.  Advanced Directives 04/02/2018 02/21/2018 11/01/2017 05/01/2016  Does Patient Have a Medical Advance Directive? Yes No Yes Yes  Type of Paramedic of [place];Living will - Healthcare Power of [place];Living will  Copy of [place] in Chart? No - copy requested - No - copy requested -    Tobacco Social History   Tobacco Use  Smoking Status Never Smoker  Smokeless Tobacco Never Used  Tobacco Comment   smoking cessation materials not required     Counseling given: No Comment: smoking cessation materials not required  Clinical Intake:  Pre-visit preparation completed: Yes  Pain : No/denies pain   BMI - recorded: 28.28 Nutritional Status: BMI 25 -29 Overweight Nutritional Risks: None Diabetes: No  How often do you need to have someone help you when you read instructions, pamphlets, or other written materials from your doctor or pharmacy?: 1 - Never  Interpreter Needed?: No  Information entered by :: AEversole, LPN  Past Medical History:  Diagnosis Date  . Cancer ([place])    skin ca  . Collagen vascular disease (HCC)    RA  . Depression, major, single episode, in partial remission ([place]) 01/29/2015  . Dyspnea   . H/O total hysterectomy   . Heartburn   . HLD (hyperlipidemia)   . Hypertension   . Neuromuscular disorder ([facility])    neuropathy in Left lower leg and foot    . Neuropathy   . Rheumatoid arteritis    Past Surgical History:  Procedure Laterality Date  . ANTERIOR AND POSTERIOR VAGINAL REPAIR  04/2015  . APPENDECTOMY  1960  . BREAST CYST ASPIRATION Left    lt fna- neg  . COLONOSCOPY WITH PROPOFOL N/A 11/01/2017   Procedure: COLONOSCOPY WITH PROPOFOL;  Surgeon: Lucilla Lame, MD;  Location: [facility];  Service: Endoscopy;  Laterality: N/A;  . ESOPHAGEAL DILATION  11/01/2017   Procedure: ESOPHAGEAL DILATION;  Surgeon: Lucilla Lame, MD;  Location: [facility];  Service: Endoscopy;;  . ESOPHAGOGASTRODUODENOSCOPY (EGD) WITH PROPOFOL N/A 11/01/2017   Procedure: ESOPHAGOGASTRODUODENOSCOPY (EGD) WITH PROPOFOL;  Surgeon: Lucilla Lame, MD;  Location: [facility];  Service: Endoscopy;  Laterality: N/A;  . LUMBAR [place] SURGERY  2011  . POLYPECTOMY  11/01/2017   Procedure: POLYPECTOMY;  Surgeon: Lucilla Lame, MD;  Location: [facility];  Service: Endoscopy;;  . riight shoulder surgery Right 1999  . SHOULDER SURGERY Left 2006  . TOTAL KNEE ARTHROPLASTY Left 2011  . VAGINAL HYSTERECTOMY  04/2015   Family History  Problem Relation Age of Onset  . Alzheimer's disease Mother   . Arthritis Mother   . Lung cancer Father   . Stomach cancer Brother   . Prostate cancer Neg Hx   . Kidney cancer Neg Hx   . Bladder Cancer Neg Hx   . Breast cancer Neg Hx  Social History   Socioeconomic History  . Marital status: Widowed    Spouse name: Not on file  . Number of children: 2  . Years of education: some college  . Highest education level: 12th grade  Occupational History  . Occupation: Retired  Scientific laboratory technician  . Financial resource strain: Not hard at all  . Food insecurity:    Worry: Never true    Inability: Never true  . Transportation needs:    Medical: No    Non-medical: No  Tobacco Use  . Smoking status: Never Smoker  . Smokeless tobacco: Never Used  . Tobacco comment: smoking cessation materials not required   Substance and Sexual Activity  . Alcohol use: No    Alcohol/week: 0.0 oz  . Drug use: No  . Sexual activity: Never  Lifestyle  . Physical activity:    Days per week: 3 days    Minutes per session: 60 min  . Stress: Not at all  Relationships  . Social connections:    Talks on phone: Patient refused    Gets together: Patient refused    Attends religious service: Patient refused    Active member of club or organization: Patient refused    Attends meetings of clubs or organizations: Patient refused    Relationship status: Widowed  Other Topics Concern  . Not on file  Social History Narrative  . Not on file    Outpatient Encounter Medications as of 04/02/2018  Medication Sig  . atenolol (TENORMIN) 50 MG tablet TAKE 1 TABLET BY MOUTH DAILY  . Biotin 5000 MCG CAPS Take by mouth.  . DULoxetine (CYMBALTA) 20 MG capsule Take 1 capsule (20 mg total) by mouth daily.  Marland Kitchen esomeprazole (NEXIUM) 20 MG capsule Take 1 capsule by mouth daily.  . famotidine (PEPCID) 20 MG tablet TAKE 1 TABLET BY MOUTH EVERYDAY AT BEDTIME  . folic acid (FOLVITE) 1 MG tablet Take 2 tablets by mouth daily.   . hydrochlorothiazide (HYDRODIURIL) 25 MG tablet TAKE 2 TABLETS BY MOUTH EVERY DAY  . methotrexate 2.5 MG tablet Take 5 tablets by mouth once a week.   . mirtazapine (REMERON) 30 MG tablet Take 1 tablet (30 mg total) by mouth at bedtime.  Marland Kitchen PREMARIN vaginal cream INSERT 1 APPLICATORFUL VAGINALLY AT BEDTIME  . simvastatin (ZOCOR) 20 MG tablet Take 1 tablet (20 mg total) by mouth daily.  Marland Kitchen trimethoprim (TRIMPEX) 100 MG tablet Take 1 tablet (100 mg total) by mouth daily.  Marland Kitchen zolpidem (AMBIEN) 5 MG tablet Take 1 tablet (5 mg total) by mouth at bedtime.  . DULoxetine (CYMBALTA) 20 MG capsule Take by mouth.  . phenazopyridine (PYRIDIUM) 200 MG tablet Take 1 tablet (200 mg total) by mouth 3 (three) times daily.   No facility-administered encounter medications on file as of 04/02/2018.     Activities of Daily  Living In your present state of health, do you have any difficulty performing the following activities: 04/02/2018 11/01/2017  Hearing? N N  Comment denies hearing aids -  Vision? N N  Comment wears eyeglasses -  Difficulty concentrating or making decisions? N N  Walking or climbing stairs? N N  Dressing or bathing? N N  Doing errands, shopping? N -  Preparing Food and eating ? N -  Comment denies dentures -  Using the Toilet? N -  In the past six months, have you accidently leaked urine? N -  Do you have problems with loss of bowel control? N -  Managing your  Medications? N -  Managing your Finances? N -  Housekeeping or managing your Housekeeping? N -  Some recent data might be hidden    Patient Care Team: Glean Hess, MD as PCP - General (Internal Medicine) Margaretha Sheffield, MD (Otolaryngology) Valinda Party, MD (Rheumatology) Bjorn Loser, MD as Consulting Physician (Urology)    Assessment:   This is a routine wellness examination for [place].  Exercise Activities and Dietary recommendations Current Exercise Habits: Structured exercise class, Time (Minutes): 60, Frequency (Times/Week): 3, Weekly Exercise (Minutes/Week): 180, Intensity: Mild, Exercise limited by: None identified  Goals    . DIET - INCREASE WATER INTAKE     Recommend to drink at least 6-8 8oz glasses of water per day.       Fall Risk Fall Risk  04/02/2018 01/17/2018 04/01/2017 05/01/2016 05/12/2015  Falls in the past year? Yes Yes Yes No Yes  Comment fell in the yard - - - -  Number falls in past yr: 2 or more 2 or more 2 or more - 2 or more  Comment - - falling in yard - -  Injury with Fall? No No No - Yes  Risk Factor Category  High Fall Risk High Fall Risk High Fall Risk - -  Risk for fall due to : Impaired vision;Impaired balance/gait;Medication side effect;History of fall(s) - - - -  Risk for fall due to: Comment wears eyeglasses; ambulates with cane - - - -  Follow up Falls evaluation  completed;Education provided;Falls prevention discussed - Falls prevention discussed - -   FALL RISK PREVENTION PERTAINING TO HOME: Is your home free of loose throw rugs in walkways, pet beds, electrical cords, etc? Yes Is there adequate lighting in your home to reduce risk of falls?  Yes Are there stairs in or around your home WITH handrails? Yes  ASSISTIVE DEVICES UTILIZED TO PREVENT FALLS: Use of a cane, walker or w/c? Yes, cane Grab bars in the bathroom? Yes  Shower chair or a place to sit while bathing? Yes An elevated toilet seat or a handicapped toilet? Yes  Timed Get Up and Go Performed: Yes. Pt ambulated 10 feet within 27 sec. Gait slow, steady and with the use of an assistive device. No intervention required at this time. Fall risk prevention has been discussed.  Community Resource Referral:  [place] Referral not required at this time.   Depression Screen PHQ 2/9 Scores 04/02/2018 03/25/2018 02/12/2018 01/17/2018  PHQ - 2 Score 0 1 3 0  PHQ- 9 Score 0 1 7 -     Cognitive Function     6CIT Screen 04/02/2018 04/01/2017  What Year? 0 points 0 points  What month? 0 points 0 points  What time? 0 points 0 points  Count back from 20 0 points 0 points  Months in reverse 0 points 0 points  Repeat phrase 0 points 0 points  Total Score 0 0    Immunization History  Administered Date(s) Administered  . Influenza,inj,Quad PF,6+ Mos 07/07/2015  . Influenza-Unspecified 06/10/2017  . Tdap 05/31/2011    Qualifies for Shingles Vaccine? Yes. Due for Shingrix. Education has been provided regarding the importance of this vaccine. Pt has been advised to call insurance company to determine out of pocket expense. Advised may also receive vaccine at local pharmacy or Health Dept. Verbalized acceptance and understanding.  Screening Tests Health Maintenance  Topic Date Due  . INFLUENZA VACCINE  07/11/2018 (Originally 04/10/2018)  . MAMMOGRAM  10/28/2018  . TETANUS/TDAP  05/30/2021   .  DEXA SCAN  Completed  . PNA vac Low Risk Adult  Completed    Cancer Screenings: Lung: Low Dose CT Chest recommended if Age 69-80 years, 30 pack-year currently smoking OR have quit w/in 15years. Patient does not qualify. Breast:  Up to date on Mammogram? Yes. Completed 10/28/17. Repeat every year   Up to date of Bone Density/Dexa? Yes. Completed 12/26/16. Repeat every 2 years Colorectal: No longer required  Additional Screenings: Hepatitis C Screening: Does not qualify    Plan:  I have personally reviewed and addressed the Medicare Annual Wellness questionnaire and have noted the following in the patient's chart:  A. Medical and social history B. Use of alcohol, tobacco or illicit drugs  C. Current medications and supplements D. Functional ability and status E.  Nutritional status F.  Physical activity G. Advance directives H. List of other physicians I.  Hospitalizations, surgeries, and ER visits in previous 12 months J.  Black such as hearing and vision if needed, cognitive and depression L. Referrals and appointments  In addition, I have reviewed and discussed with patient certain preventive protocols, quality metrics, and best practice recommendations. A written personalized care plan for preventive services as well as general preventive health recommendations were provided to patient.  Signed,  Aleatha Borer, LPN Nurse Health Advisor  MD Recommendations: Due for Shingrix. Education has been provided regarding the importance of this vaccine. Pt has been advised to call insurance company to determine out of pocket expense. Advised may also receive vaccine at local pharmacy or Health Dept. Verbalized acceptance and understanding.

## 2018-04-02 NOTE — Patient Instructions (Addendum)
Caroline Conway , Thank you for taking time to come for your Medicare Wellness Visit. I appreciate your ongoing commitment to your health goals. Please review the following plan we discussed and let me know if I can assist you in the future.   Screening recommendations/referrals: Colorectal Screening: No longer required Mammogram: Upto date Bone Density: Up to date  Vision and Dental Exams: Recommended annual ophthalmology exams for early detection of glaucoma and other disorders of the eye Recommended annual dental exams for proper oral hygiene  Vaccinations: Influenza vaccine: Up to date Pneumococcal vaccine: Up to date Tdap vaccine: Up to date Shingles vaccine: Please call your insurance company to determine your out of pocket expense for the Shingrix vaccine. You may also receive this vaccine at your local pharmacy or Health Dept.    Advanced directives: Please bring a copy of your POA (Power of Attorney) and/or Living Will to your next appointment.  Goals: Recommend to drink at least 6-8 8oz glasses of water per day.  Next appointment: Please schedule your Annual Wellness Visit with your Nurse Health Advisor in one year.  Preventive Care 60 Years and Older, Female Preventive care refers to lifestyle choices and visits with your health care provider that can promote health and wellness. What does preventive care include?  A yearly physical exam. This is also called an annual well check.  Dental exams once or twice a year.  Routine eye exams. Ask your health care provider how often you should have your eyes checked.  Personal lifestyle choices, including:  Daily care of your teeth and gums.  Regular physical activity.  Eating a healthy diet.  Avoiding tobacco and drug use.  Limiting alcohol use.  Practicing safe sex.  Taking low-dose aspirin every day.  Taking vitamin and mineral supplements as recommended by your health care provider. What happens during an annual  well check? The services and screenings done by your health care provider during your annual well check will depend on your age, overall health, lifestyle risk factors, and family history of disease. Counseling  Your health care provider may ask you questions about your:  Alcohol use.  Tobacco use.  Drug use.  Emotional well-being.  Home and relationship well-being.  Sexual activity.  Eating habits.  History of falls.  Memory and ability to understand (cognition).  Work and work Statistician.  Reproductive health. Screening  You may have the following tests or measurements:  Height, weight, and BMI.  Blood pressure.  Lipid and cholesterol levels. These may be checked every 5 years, or more frequently if you are over 54 years old.  Skin check.  Lung cancer screening. You may have this screening every year starting at age 13 if you have a 30-pack-year history of smoking and currently smoke or have quit within the past 15 years.  Fecal occult blood test (FOBT) of the stool. You may have this test every year starting at age 90.  Flexible sigmoidoscopy or colonoscopy. You may have a sigmoidoscopy every 5 years or a colonoscopy every 10 years starting at age 58.  Hepatitis C blood test.  Hepatitis B blood test.  Sexually transmitted disease (STD) testing.  Diabetes screening. This is done by checking your blood sugar (glucose) after you have not eaten for a while (fasting). You may have this done every 1-3 years.  Bone density scan. This is done to screen for osteoporosis. You may have this done starting at age 16.  Mammogram. This may be done every 1-2 years.  Talk to your health care provider about how often you should have regular mammograms. Talk with your health care provider about your test results, treatment options, and if necessary, the need for more tests. Vaccines  Your health care provider may recommend certain vaccines, such as:  Influenza vaccine. This  is recommended every year.  Tetanus, diphtheria, and acellular pertussis (Tdap, Td) vaccine. You may need a Td booster every 10 years.  Zoster vaccine. You may need this after age 73.  Pneumococcal 13-valent conjugate (PCV13) vaccine. One dose is recommended after age 59.  Pneumococcal polysaccharide (PPSV23) vaccine. One dose is recommended after age 84. Talk to your health care provider about which screenings and vaccines you need and how often you need them. This information is not intended to replace advice given to you by your health care provider. Make sure you discuss any questions you have with your health care provider. Document Released: 09/23/2015 Document Revised: 05/16/2016 Document Reviewed: 06/28/2015 Elsevier Interactive Patient Education  2017 [employer] Prevention in the Home Falls can cause injuries. They can happen to people of all ages. There are many things you can do to make your home safe and to help prevent falls. What can I do on the outside of my home?  Regularly fix the edges of walkways and driveways and fix any cracks.  Remove anything that might make you trip as you walk through a door, such as a raised step or threshold.  Trim any bushes or trees on the path to your home.  Use bright outdoor lighting.  Clear any walking paths of anything that might make someone trip, such as rocks or tools.  Regularly check to see if handrails are loose or broken. Make sure that both sides of any steps have handrails.  Any raised decks and porches should have guardrails on the edges.  Have any leaves, snow, or ice cleared regularly.  Use sand or salt on walking paths during winter.  Clean up any spills in your garage right away. This includes oil or grease spills. What can I do in the bathroom?  Use night lights.  Install grab bars by the toilet and in the tub and shower. Do not use towel bars as grab bars.  Use non-skid mats or decals in the tub or  shower.  If you need to sit down in the shower, use a plastic, non-slip stool.  Keep the floor dry. Clean up any water that spills on the floor as soon as it happens.  Remove soap buildup in the tub or shower regularly.  Attach bath mats securely with double-sided non-slip rug tape.  Do not have throw rugs and other things on the floor that can make you trip. What can I do in the bedroom?  Use night lights.  Make sure that you have a light by your bed that is easy to reach.  Do not use any sheets or blankets that are too big for your bed. They should not hang down onto the floor.  Have a firm chair that has side arms. You can use this for support while you get dressed.  Do not have throw rugs and other things on the floor that can make you trip. What can I do in the kitchen?  Clean up any spills right away.  Avoid walking on wet floors.  Keep items that you use a lot in easy-to-reach places.  If you need to reach something above you, use a strong step stool  that has a grab bar.  Keep electrical cords out of the way.  Do not use floor polish or wax that makes floors slippery. If you must use wax, use non-skid floor wax.  Do not have throw rugs and other things on the floor that can make you trip. What can I do with my stairs?  Do not leave any items on the stairs.  Make sure that there are handrails on both sides of the stairs and use them. Fix handrails that are broken or loose. Make sure that handrails are as long as the stairways.  Check any carpeting to make sure that it is firmly attached to the stairs. Fix any carpet that is loose or worn.  Avoid having throw rugs at the top or bottom of the stairs. If you do have throw rugs, attach them to the floor with carpet tape.  Make sure that you have a light switch at the top of the stairs and the bottom of the stairs. If you do not have them, ask someone to add them for you. What else can I do to help prevent  falls?  Wear shoes that:  Do not have high heels.  Have rubber bottoms.  Are comfortable and fit you well.  Are closed at the toe. Do not wear sandals.  If you use a stepladder:  Make sure that it is fully opened. Do not climb a closed stepladder.  Make sure that both sides of the stepladder are locked into place.  Ask someone to hold it for you, if possible.  Clearly mark and make sure that you can see:  Any grab bars or handrails.  First and last steps.  Where the edge of each step is.  Use tools that help you move around (mobility aids) if they are needed. These include:  Canes.  Walkers.  Scooters.  Crutches.  Turn on the lights when you go into a dark area. Replace any light bulbs as soon as they burn out.  Set up your furniture so you have a clear path. Avoid moving your furniture around.  If any of your floors are uneven, fix them.  If there are any pets around you, be aware of where they are.  Review your medicines with your doctor. Some medicines can make you feel dizzy. This can increase your chance of falling. Ask your doctor what other things that you can do to help prevent falls. This information is not intended to replace advice given to you by your health care provider. Make sure you discuss any questions you have with your health care provider. Document Released: 06/23/2009 Document Revised: 02/02/2016 Document Reviewed: 10/01/2014 Elsevier Interactive Patient Education  2017 [employer].

## 2018-04-25 ENCOUNTER — Encounter: Payer: Self-pay | Admitting: Internal Medicine

## 2018-04-25 ENCOUNTER — Ambulatory Visit (INDEPENDENT_AMBULATORY_CARE_PROVIDER_SITE_OTHER): Payer: PPO | Admitting: Internal Medicine

## 2018-04-25 VITALS — BP 130/78 | HR 63 | Ht 60.0 in | Wt 143.0 lb

## 2018-04-25 DIAGNOSIS — N3001 Acute cystitis with hematuria: Secondary | ICD-10-CM | POA: Diagnosis not present

## 2018-04-25 LAB — POC URINALYSIS WITH MICROSCOPIC (NON AUTO)MANUAL RESULT
Bilirubin, UA: NEGATIVE
CRYSTALS: 0
GLUCOSE UA: NEGATIVE
Ketones, UA: NEGATIVE
MUCUS UA: 0
Nitrite, UA: POSITIVE
Protein, UA: POSITIVE — AB
RBC: 5 M/uL (ref 4.04–5.48)
SPEC GRAV UA: 1.015 (ref 1.010–1.025)
Urobilinogen, UA: 0.2 E.U./dL
pH, UA: 6 (ref 5.0–8.0)

## 2018-04-25 MED ORDER — CIPROFLOXACIN HCL 250 MG PO TABS: 250.0000 mg | ORAL_TABLET | Freq: Two times a day (BID) | ORAL | 0 refills | Status: AC

## 2018-04-25 NOTE — Progress Notes (Signed)
Date:  04/25/2018   Name:  Caroline Conway   DOB:  1940-11-30   MRN:  [id number]   Chief Complaint: Urinary Tract Infection (3-4 days- Urgency, frequency, and getting up at night 4 times. Cloudy urine.)  Urinary Tract Infection   This is a recurrent problem. The problem has been gradually worsening. The quality of the pain is described as burning. The pain is moderate. There has been no fever. Associated symptoms include frequency and urgency. Pertinent negatives include no chills, flank pain, hematuria, hesitancy, nausea or vomiting. She has tried antibiotics for the symptoms. The treatment provided no relief.  She is on daily bactrim for prevention.   Review of Systems  Constitutional: Negative for chills.  Respiratory: Negative for chest tightness, shortness of breath and wheezing.   Cardiovascular: Negative for chest pain and palpitations.  Gastrointestinal: Negative for nausea and vomiting.  Genitourinary: Positive for frequency and urgency. Negative for flank pain, hematuria and hesitancy.    Patient Active Problem List   Diagnosis Date Noted  . Current moderate episode of major depressive disorder without prior episode ([place]) 03/25/2018  . Abnormal feces   . Benign neoplasm of transverse colon   . Esophageal dysphagia   . Stricture and stenosis of esophagus   . Abnormal CXR 03/21/2017  . Chronic renal insufficiency, stage 3 (moderate) ([place]) 03/19/2017  . Venous insufficiency of both lower extremities 02/13/2017  . Herpes simplex infection 07/07/2015  . Anxiety disorder 01/29/2015  . Atrophic vaginitis 01/29/2015  . Dyslipidemia 01/29/2015  . Essential (primary) hypertension 01/29/2015  . Acid reflux 01/29/2015  . Idiopathic peripheral neuropathy 01/29/2015  . Idiopathic insomnia 01/29/2015  . Rheumatoid arthritis involving multiple joints ([place]) 01/29/2015  . Urge incontinence 01/29/2015  . Pelvic relaxation due to vaginal prolapse 01/29/2015    Allergies    Allergen Reactions  . Codeine Sulfate Hives  . Oxycodone Itching  . Propoxyphene Hives  . Doxycycline Other (See Comments)    Headache  . Penicillins Rash  . Sulfa Antibiotics Rash    Past Surgical History:  Procedure Laterality Date  . ANTERIOR AND POSTERIOR VAGINAL REPAIR  04/2015  . APPENDECTOMY  1960  . BREAST CYST ASPIRATION Left    lt fna- neg  . COLONOSCOPY WITH PROPOFOL N/A 11/01/2017   Procedure: COLONOSCOPY WITH PROPOFOL;  Surgeon: Lucilla Lame, MD;  Location: [place];  Service: Endoscopy;  Laterality: N/A;  . ESOPHAGEAL DILATION  11/01/2017   Procedure: ESOPHAGEAL DILATION;  Surgeon: Lucilla Lame, MD;  Location: [place];  Service: Endoscopy;;  . ESOPHAGOGASTRODUODENOSCOPY (EGD) WITH PROPOFOL N/A 11/01/2017   Procedure: ESOPHAGOGASTRODUODENOSCOPY (EGD) WITH PROPOFOL;  Surgeon: Lucilla Lame, MD;  Location: [place];  Service: Endoscopy;  Laterality: N/A;  . LUMBAR [place] SURGERY  2011  . POLYPECTOMY  11/01/2017   Procedure: POLYPECTOMY;  Surgeon: Lucilla Lame, MD;  Location: [facility];  Service: Endoscopy;;  . riight shoulder surgery Right 1999  . SHOULDER SURGERY Left 2006  . TOTAL KNEE ARTHROPLASTY Left 2011  . VAGINAL HYSTERECTOMY  04/2015    Social History   Tobacco Use  . Smoking status: Never Smoker  . Smokeless tobacco: Never Used  . Tobacco comment: smoking cessation materials not required  Substance Use Topics  . Alcohol use: No    Alcohol/week: 0.0 standard drinks  . Drug use: No     Medication list has been reviewed and updated.  Current Meds  Medication Sig  . atenolol (TENORMIN) 50 MG tablet TAKE 1  TABLET BY MOUTH DAILY  . Biotin 5000 MCG CAPS Take by mouth.  . DULoxetine (CYMBALTA) 20 MG capsule Take by mouth.  . DULoxetine (CYMBALTA) 20 MG capsule Take 1 capsule (20 mg total) by mouth daily.  Marland Kitchen esomeprazole (NEXIUM) 20 MG capsule Take 1 capsule by mouth daily.  . famotidine (PEPCID) 20 MG tablet TAKE 1  TABLET BY MOUTH EVERYDAY AT BEDTIME  . folic acid (FOLVITE) 1 MG tablet Take 2 tablets by mouth daily.   . hydrochlorothiazide (HYDRODIURIL) 25 MG tablet TAKE 2 TABLETS BY MOUTH EVERY DAY  . methotrexate 2.5 MG tablet Take 5 tablets by mouth once a week.   . mirtazapine (REMERON) 30 MG tablet Take 1 tablet (30 mg total) by mouth at bedtime.  . phenazopyridine (PYRIDIUM) 200 MG tablet Take 1 tablet (200 mg total) by mouth 3 (three) times daily.  Marland Kitchen PREMARIN vaginal cream INSERT 1 APPLICATORFUL VAGINALLY AT BEDTIME  . simvastatin (ZOCOR) 20 MG tablet Take 1 tablet (20 mg total) by mouth daily.  Marland Kitchen trimethoprim (TRIMPEX) 100 MG tablet Take 1 tablet (100 mg total) by mouth daily.  Marland Kitchen zolpidem (AMBIEN) 5 MG tablet Take 1 tablet (5 mg total) by mouth at bedtime.    PHQ 2/9 Scores 04/02/2018 03/25/2018 02/12/2018 01/17/2018  PHQ - 2 Score 0 1 3 0  PHQ- 9 Score 0 1 7 -    Physical Exam  Constitutional: She appears well-developed and well-nourished.  Cardiovascular: Normal rate, regular rhythm and normal heart sounds.  Pulmonary/Chest: Effort normal and breath sounds normal. No respiratory distress.  Abdominal: Soft. Bowel sounds are normal. There is tenderness in the suprapubic area. There is no rebound, no guarding and no CVA tenderness.  Psychiatric: She has a normal mood and affect.  Nursing note and vitals reviewed.   BP 130/78   Pulse 63   Ht 5' (1.524 m)   Wt 143 lb (64.9 kg)   SpO2 98%   BMI 27.93 kg/m   Assessment and Plan: 1. Acute cystitis with hematuria Continue fluids If recurrent, will refer back to Urology sooner than April - Urine Culture - ciprofloxacin (CIPRO) 250 MG tablet; Take 1 tablet (250 mg total) by mouth 2 (two) times daily for 7 days.  Dispense: 14 tablet; Refill: 0 - POC urinalysis w microscopic (non auto)   Meds ordered this encounter  Medications  . ciprofloxacin (CIPRO) 250 MG tablet    Sig: Take 1 tablet (250 mg total) by mouth 2 (two) times daily for 7  days.    Dispense:  14 tablet    Refill:  0    Partially dictated using Editor, commissioning. Any errors are unintentional.  Halina Maidens, MD [facility]  04/25/2018

## 2018-04-27 LAB — URINE CULTURE

## 2018-04-27 LAB — SPECIMEN STATUS REPORT

## 2018-06-02 DIAGNOSIS — Z79899 Other long term (current) drug therapy: Secondary | ICD-10-CM | POA: Diagnosis not present

## 2018-06-02 DIAGNOSIS — M810 Age-related osteoporosis without current pathological fracture: Secondary | ICD-10-CM | POA: Diagnosis not present

## 2018-06-02 DIAGNOSIS — M0579 Rheumatoid arthritis with rheumatoid factor of multiple sites without organ or systems involvement: Secondary | ICD-10-CM | POA: Diagnosis not present

## 2018-06-02 DIAGNOSIS — M159 Polyosteoarthritis, unspecified: Secondary | ICD-10-CM | POA: Diagnosis not present

## 2018-06-04 ENCOUNTER — Other Ambulatory Visit: Payer: Self-pay | Admitting: Internal Medicine

## 2018-06-16 DIAGNOSIS — J3502 Chronic adenoiditis: Secondary | ICD-10-CM | POA: Diagnosis not present

## 2018-06-16 DIAGNOSIS — J3489 Other specified disorders of nose and nasal sinuses: Secondary | ICD-10-CM | POA: Diagnosis not present

## 2018-07-15 ENCOUNTER — Other Ambulatory Visit: Payer: Self-pay | Admitting: Internal Medicine

## 2018-07-19 ENCOUNTER — Other Ambulatory Visit: Payer: Self-pay | Admitting: Internal Medicine

## 2018-07-19 DIAGNOSIS — I10 Essential (primary) hypertension: Secondary | ICD-10-CM

## 2018-07-20 ENCOUNTER — Other Ambulatory Visit: Payer: Self-pay | Admitting: Internal Medicine

## 2018-07-21 ENCOUNTER — Ambulatory Visit: Payer: PPO | Admitting: Internal Medicine

## 2018-07-21 DIAGNOSIS — J3502 Chronic adenoiditis: Secondary | ICD-10-CM | POA: Diagnosis not present

## 2018-07-21 DIAGNOSIS — J3489 Other specified disorders of nose and nasal sinuses: Secondary | ICD-10-CM | POA: Diagnosis not present

## 2018-08-11 ENCOUNTER — Other Ambulatory Visit: Payer: Self-pay | Admitting: Internal Medicine

## 2018-08-11 DIAGNOSIS — F5101 Primary insomnia: Secondary | ICD-10-CM

## 2018-08-28 ENCOUNTER — Encounter: Payer: Self-pay | Admitting: Podiatry

## 2018-08-28 ENCOUNTER — Ambulatory Visit: Payer: PPO | Admitting: Podiatry

## 2018-08-28 VITALS — BP 127/64 | HR 59

## 2018-08-28 DIAGNOSIS — M2041 Other hammer toe(s) (acquired), right foot: Secondary | ICD-10-CM

## 2018-08-28 DIAGNOSIS — M2012 Hallux valgus (acquired), left foot: Secondary | ICD-10-CM

## 2018-08-28 DIAGNOSIS — L98491 Non-pressure chronic ulcer of skin of other sites limited to breakdown of skin: Secondary | ICD-10-CM

## 2018-08-28 DIAGNOSIS — M2011 Hallux valgus (acquired), right foot: Secondary | ICD-10-CM | POA: Diagnosis not present

## 2018-08-28 DIAGNOSIS — L089 Local infection of the skin and subcutaneous tissue, unspecified: Secondary | ICD-10-CM

## 2018-08-28 MED ORDER — ERYTHROMYCIN 333 MG PO TBEC: 1.0000 | DELAYED_RELEASE_TABLET | Freq: Three times a day (TID) | ORAL | 0 refills | Status: DC

## 2018-08-28 NOTE — Progress Notes (Signed)
This patient presents the office with chief complaint of an inflamed red toe second right foot.  She says that she believes she has a skin lesion which she has been soaking in peroxide and for over 4 weeks now.  She says that her second toe is elevated and rubbing into her shoe which has led to her having a break in the skin on the top of the second toe right foot.  She says that she is going on vacation next week for Christmas and desires to be evaluated.  She states that she has had minimal pain and discomfort to the second toe but is concerned about the redness and the fact that she is going on vacation.  This patient also has bunions which cause an overlapping second toe of the right foot.  She says she has difficulty with her shoes but needs to wear her shoes due to her balance problems.  She presents the office today for an evaluation and treatment of her second toe right foot.  Vascular  Dorsalis pedis and posterior tibial pulses are palpable  B/L.  Capillary return  WNL.  Temperature gradient is  WNL.  Skin turgor  WNL  Sensorium  Senn Weinstein monofilament wire  WNL. Normal tactile sensation.  Nail Exam  Patient has normal nails with no evidence of bacterial or fungal infection.  Orthopedic  Exam  Muscle tone and muscle strength  WNL.  No limitations of motion feet  B/L.  No crepitus or joint effusion noted.  Foot type is unremarkable and digits show no abnormalities.  HAV with hammer toe second right.  Skin  Red swollen inflamed second toe right foot.  No drainage or infection noted.  Normal skin texture and turgor except second toe right.  Infected ulcer secondary to hammer toe  IE>  Debride ulcer.  Discussed this condition with this patient.  Told her continue soaks and peroxide and locally to the second toe.  Neosporin dry sterile dressing was applied.  Patient was called in E-Mycin 333 mg to be taken 1 3 times daily x10 days.  She is to return the office in 10 to 14 days for evaluation  and treatment.     Gardiner Barefoot DPM

## 2018-09-04 ENCOUNTER — Telehealth: Payer: Self-pay | Admitting: Podiatry

## 2018-09-04 NOTE — Telephone Encounter (Signed)
Pt was prescribed antibiotic that no [facility] she has tried has in stock. Needing something else prescribed. Please give pt a call

## 2018-09-04 NOTE — Telephone Encounter (Signed)
Dr. Prudence Davidson, I spoke to the pharmacist and he told me that   E- mycin 333mg  is not available now.  If you want to send something else in, please let me know.  Thanks

## 2018-09-11 ENCOUNTER — Encounter: Payer: Self-pay | Admitting: Podiatry

## 2018-09-11 ENCOUNTER — Ambulatory Visit: Payer: PPO | Admitting: Podiatry

## 2018-09-11 DIAGNOSIS — M2041 Other hammer toe(s) (acquired), right foot: Secondary | ICD-10-CM | POA: Diagnosis not present

## 2018-09-11 DIAGNOSIS — L089 Local infection of the skin and subcutaneous tissue, unspecified: Secondary | ICD-10-CM

## 2018-09-11 DIAGNOSIS — L98491 Non-pressure chronic ulcer of skin of other sites limited to breakdown of skin: Secondary | ICD-10-CM | POA: Diagnosis not present

## 2018-09-11 NOTE — Progress Notes (Signed)
This patient presents to the office for continued evaluation of open wound second toe right foot.  She has a chronic ulcer second toe right foot.  This toe has formed an ulcer due to contracted digit second right.  She states she  Has been soaking and bandaging her toe.  Patient had emycin prescribed which she was unable to fill.  (due to MG).  She has been on vacation and visited [place].  Therefore no antibiotic was ever prescribed.  She presents to the office today for continued evaluation and treatment.  She says she has seen no improvement to the toe.  Vascular  Dorsalis pedis and posterior tibial pulses are palpable  B/L.  Capillary return  WNL.  Temperature gradient is  WNL.  Skin turgor  WNL  Sensorium  Senn Weinstein monofilament wire  WNL. Normal tactile sensation.  Nail Exam  Patient has normal nails with no evidence of bacterial or fungal infection.  Orthopedic  Exam  Muscle tone and muscle strength  WNL.  No limitations of motion feet  B/L.  No crepitus or joint effusion noted.  Foot type is unremarkable and digits show no abnormalities.HAV with hammer toes 2 right.  Skin    Normal skin texture and turgor.Open ulcer at PIPJ second toe right foot.  Swelling and redness persists but improvement is noted.    Ulcer secondary to hammer toe right.  ROV  Examined ulcer second toe right foot. The ulcer is  the same size as previous visit and  redness and swelling surrounding second toe has diminished but is still present.  Discussed treatment with this patient.  Discussed her multiple reactions to antibiotics and therefore prescribe emycin 250 mg.  Take one bid.  Continue home soaks and bandaging.  Tried to dispense surgical shoe but she says she felt she could lose her balance easily so she opted to not take the surgical shoe.  She suggested cutting a hole in her shoe to take pressure off her second toe right foot.  RTC 2 weeks. To consider foot surgery in spring if problem persists.   Gardiner Barefoot DPM

## 2018-09-12 DIAGNOSIS — M0579 Rheumatoid arthritis with rheumatoid factor of multiple sites without organ or systems involvement: Secondary | ICD-10-CM | POA: Diagnosis not present

## 2018-09-12 DIAGNOSIS — M159 Polyosteoarthritis, unspecified: Secondary | ICD-10-CM | POA: Diagnosis not present

## 2018-09-12 DIAGNOSIS — M810 Age-related osteoporosis without current pathological fracture: Secondary | ICD-10-CM | POA: Diagnosis not present

## 2018-09-12 DIAGNOSIS — R3915 Urgency of urination: Secondary | ICD-10-CM | POA: Diagnosis not present

## 2018-09-12 DIAGNOSIS — Z79899 Other long term (current) drug therapy: Secondary | ICD-10-CM | POA: Diagnosis not present

## 2018-09-15 ENCOUNTER — Encounter: Payer: Self-pay | Admitting: Internal Medicine

## 2018-09-15 ENCOUNTER — Ambulatory Visit (INDEPENDENT_AMBULATORY_CARE_PROVIDER_SITE_OTHER): Payer: PPO | Admitting: Internal Medicine

## 2018-09-15 VITALS — BP 118/66 | HR 56 | Temp 97.8°F | Ht 60.0 in | Wt 144.0 lb

## 2018-09-15 DIAGNOSIS — M21611 Bunion of right foot: Secondary | ICD-10-CM | POA: Diagnosis not present

## 2018-09-15 DIAGNOSIS — N3001 Acute cystitis with hematuria: Secondary | ICD-10-CM

## 2018-09-15 DIAGNOSIS — I872 Venous insufficiency (chronic) (peripheral): Secondary | ICD-10-CM | POA: Diagnosis not present

## 2018-09-15 LAB — POCT URINALYSIS DIPSTICK
BILIRUBIN UA: NEGATIVE
GLUCOSE UA: NEGATIVE
Ketones, UA: NEGATIVE
Nitrite, UA: POSITIVE
Protein, UA: NEGATIVE
Spec Grav, UA: 1.015 (ref 1.010–1.025)
UROBILINOGEN UA: 0.2 U/dL
pH, UA: 6.5 (ref 5.0–8.0)

## 2018-09-15 MED ORDER — CIPROFLOXACIN HCL 250 MG PO TABS: 250.0000 mg | ORAL_TABLET | Freq: Two times a day (BID) | ORAL | 0 refills | Status: AC

## 2018-09-15 NOTE — Progress Notes (Signed)
Date:  09/15/2018   Name:  Caroline Conway   DOB:  1941/03/17   MRN:  [id number]   Chief Complaint: Urinary Urgency (Constantly going to the bathroom. Cannot hold urine. Started 3 days ago. Having to wear large pads. Has had three accidents today having to change clothes after. )  Urinary Tract Infection   This is a new problem. The current episode started in the past 7 days. The problem occurs every urination. The patient is experiencing no pain. There has been no fever. Associated symptoms include chills, frequency, nausea and urgency. Pertinent negatives include no hematuria or vomiting.  Edema - she has had some mild increase in LE swelling over the holidays.  She is wearing light compression stockings and trying to elevate.  She has HCTZ 1 per day but increased back to 2 per day recently.  Review of Systems  Constitutional: Positive for chills. Negative for fatigue and fever.  Respiratory: Negative for chest tightness and shortness of breath.   Cardiovascular: Positive for leg swelling. Negative for chest pain and palpitations.  Gastrointestinal: Positive for nausea. Negative for abdominal pain, diarrhea and vomiting.  Genitourinary: Positive for frequency and urgency. Negative for difficulty urinating, dysuria and hematuria.    Patient Active Problem List   Diagnosis Date Noted  . Current moderate episode of major depressive disorder without prior episode ([place]) 03/25/2018  . Abnormal feces   . Benign neoplasm of transverse colon   . Esophageal dysphagia   . Stricture and stenosis of esophagus   . Abnormal CXR 03/21/2017  . Chronic renal insufficiency, stage 3 (moderate) ([place]) 03/19/2017  . Venous insufficiency of both lower extremities 02/13/2017  . Herpes simplex infection 07/07/2015  . Anxiety disorder 01/29/2015  . Atrophic vaginitis 01/29/2015  . Dyslipidemia 01/29/2015  . Essential (primary) hypertension 01/29/2015  . Acid reflux 01/29/2015  . Idiopathic peripheral  neuropathy 01/29/2015  . Idiopathic insomnia 01/29/2015  . Rheumatoid arthritis involving multiple joints ([place]) 01/29/2015  . Urge incontinence 01/29/2015  . Pelvic relaxation due to vaginal prolapse 01/29/2015    Allergies  Allergen Reactions  . Nitrofuran Derivatives Diarrhea  . Codeine Sulfate Hives  . Oxycodone Itching  . Propoxyphene Hives  . Doxycycline Other (See Comments)    Headache  . Penicillins Rash  . Sulfa Antibiotics Rash    Past Surgical History:  Procedure Laterality Date  . ANTERIOR AND POSTERIOR VAGINAL REPAIR  04/2015  . APPENDECTOMY  1960  . BREAST CYST ASPIRATION Left    lt fna- neg  . COLONOSCOPY WITH PROPOFOL N/A 11/01/2017   Procedure: COLONOSCOPY WITH PROPOFOL;  Surgeon: Lucilla Lame, MD;  Location: [place];  Service: Endoscopy;  Laterality: N/A;  . ESOPHAGEAL DILATION  11/01/2017   Procedure: ESOPHAGEAL DILATION;  Surgeon: Lucilla Lame, MD;  Location: [place];  Service: Endoscopy;;  . ESOPHAGOGASTRODUODENOSCOPY (EGD) WITH PROPOFOL N/A 11/01/2017   Procedure: ESOPHAGOGASTRODUODENOSCOPY (EGD) WITH PROPOFOL;  Surgeon: Lucilla Lame, MD;  Location: [place];  Service: Endoscopy;  Laterality: N/A;  . LUMBAR [place] SURGERY  2011  . POLYPECTOMY  11/01/2017   Procedure: POLYPECTOMY;  Surgeon: Lucilla Lame, MD;  Location: [place];  Service: Endoscopy;;  . riight shoulder surgery Right 1999  . SHOULDER SURGERY Left 2006  . TOTAL KNEE ARTHROPLASTY Left 2011  . VAGINAL HYSTERECTOMY  04/2015    Social History   Tobacco Use  . Smoking status: Never Smoker  . Smokeless tobacco: Never Used  . Tobacco comment: smoking cessation materials  not required  Substance Use Topics  . Alcohol use: No    Alcohol/week: 0.0 standard drinks  . Drug use: No     Medication list has been reviewed and updated.  Current Meds  Medication Sig  . atenolol (TENORMIN) 50 MG tablet TAKE 1 TABLET BY MOUTH DAILY  . Biotin 5000 MCG CAPS  Take by mouth.  . DULoxetine (CYMBALTA) 20 MG capsule TAKE 1 CAPSULE BY MOUTH EVERY DAY  . Erythromycin 333 MG TBEC Take 1 tablet (333 mg total) by mouth 3 (three) times daily.  Marland Kitchen esomeprazole (NEXIUM) 20 MG capsule Take 1 capsule by mouth daily.  . famotidine (PEPCID) 20 MG tablet TAKE 1 TABLET BY MOUTH EVERYDAY AT BEDTIME  . folic acid (FOLVITE) 1 MG tablet Take 2 tablets by mouth daily.   . hydrochlorothiazide (HYDRODIURIL) 25 MG tablet TAKE 2 TABLETS BY MOUTH EVERY DAY  . methotrexate 2.5 MG tablet Take 5 tablets by mouth once a week.   . mirtazapine (REMERON) 30 MG tablet TAKE 1 TABLET (30 MG TOTAL) BY MOUTH AT BEDTIME.  Marland Kitchen phenazopyridine (PYRIDIUM) 200 MG tablet Take 1 tablet (200 mg total) by mouth 3 (three) times daily.  Marland Kitchen PREMARIN vaginal cream INSERT 1 APPLICATORFUL VAGINALLY AT BEDTIME  . simvastatin (ZOCOR) 20 MG tablet Take 1 tablet (20 mg total) by mouth daily.  Marland Kitchen trimethoprim (TRIMPEX) 100 MG tablet Take 1 tablet (100 mg total) by mouth daily.  Marland Kitchen zolpidem (AMBIEN) 5 MG tablet TAKE 1 TABLET BY MOUTH EVERYDAY AT BEDTIME    PHQ 2/9 Scores 09/15/2018 04/02/2018 03/25/2018 02/12/2018  PHQ - 2 Score 0 0 1 3  PHQ- 9 Score 1 0 1 7   Wt Readings from Last 3 Encounters:  09/15/18 144 lb (65.3 kg)  04/25/18 143 lb (64.9 kg)  04/02/18 144 lb 12.8 oz (65.7 kg)    Physical Exam Vitals signs and nursing note reviewed.  Constitutional:      Appearance: She is well-developed.  Cardiovascular:     Rate and Rhythm: Normal rate and regular rhythm.     Heart sounds: Normal heart sounds.  Pulmonary:     Effort: Pulmonary effort is normal. No respiratory distress.     Breath sounds: Normal breath sounds.  Abdominal:     General: Bowel sounds are normal.     Palpations: Abdomen is soft.     Tenderness: There is abdominal tenderness in the suprapubic area. There is no right CVA tenderness, left CVA tenderness, guarding or rebound.  Musculoskeletal:     Right lower leg: Edema present.      Left lower leg: Edema present.       Feet:    BP 118/66   Pulse (!) 56   Temp 97.8 F (36.6 C) (Oral)   Ht 5' (1.524 m)   Wt 144 lb (65.3 kg)   SpO2 99%   BMI 28.12 kg/m   Assessment and Plan: 1. Acute cystitis with hematuria Increase fluids - POCT urinalysis dipstick - ciprofloxacin (CIPRO) 250 MG tablet; Take 1 tablet (250 mg total) by mouth 2 (two) times daily for 7 days.  Dispense: 14 tablet; Refill: 0  2. Bunion of great toe of right foot Follow up with Podiatry  3. Venous insufficiency of both lower extremities Continue compression stockings Elevate, limit sodium May take 2 HCTZ for 1-2 weeks to control edema, then resume 1/day  Partially dictated using Editor, commissioning. Any errors are unintentional.  Halina Maidens, MD [facility]  09/15/2018      

## 2018-09-21 ENCOUNTER — Ambulatory Visit
Admission: EM | Admit: 2018-09-21 | Discharge: 2018-09-21 | Disposition: A | Discharge status: Home / Self Care | Payer: PPO | Attending: Physician Assistant | Admitting: Physician Assistant

## 2018-09-21 ENCOUNTER — Other Ambulatory Visit: Payer: Self-pay

## 2018-09-21 DIAGNOSIS — S161XXA Strain of muscle, fascia and tendon at neck level, initial encounter: Secondary | ICD-10-CM | POA: Insufficient documentation

## 2018-09-21 DIAGNOSIS — R51 Headache: Secondary | ICD-10-CM | POA: Insufficient documentation

## 2018-09-21 DIAGNOSIS — R519 Headache, unspecified: Secondary | ICD-10-CM

## 2018-09-21 MED ORDER — CYCLOBENZAPRINE HCL 10 MG PO TABS: 10.0000 mg | ORAL_TABLET | Freq: Three times a day (TID) | ORAL | 0 refills | Status: AC | PRN

## 2018-09-21 NOTE — Discharge Instructions (Signed)
NECK PAIN: Stressed avoiding painful activities. This can exacerbate your symptoms and make them worse. May apply heat to the areas of pain for some relief. Use medications as directed. Be aware of which medications make you drowsy and do not drive or operate any kind of heavy machinery while using the medication. F/U with PCP for reexamination or return sooner if condition worsens or does not begin to improve over the next few days.   NECK PAIN RED FLAGS: If symptoms get worse than they are right now, they must come back sooner. If the patient has increased numbness/ tingling or notices that the numbness/tingling is affecting the legs or saddle region, they must go to ER. If they ever lose continence then they must go to ER.   HEADACHE: You were seen in clinic today for headache. Rest and take meds as directed. If at any point, the headache becomes very severe, is associated with fever, is associated with neck pain/stiffness, you feel like passing out, the headache is different from any you've have had before, there are vision changes/issues with speech/issues with balance, or numbness/weakness in a part of the body, you should be seen urgently or emergently for more serious causes of headache

## 2018-09-21 NOTE — ED Provider Notes (Signed)
[facility]    CSN: [id number] Arrival date & time: 09/21/18  1032     History   Chief Complaint Chief Complaint  Patient presents with  . Headache    HPI Caroline Conway is a 78 y.o. female. Patient presents for 2 day history of posterior neck pain and headache. Admits to pain only when moving the neck. Denies injury. Denies fever. Headache and neck pain are 7/10 and have improved somewhat with Tylenol.She denies photophobia, numbness/weakness/tingling, fainting or feeling faint, nausea/vomiting, chest pain, breathing problems. Denies sore throat, nasal congestion, sinus pain, cough. She does have history of RA and collagen vascular disease. She admits to recent use of Cipro and has been on the medication for 5 days for UTI. UTI symptoms have resolved. She wonders if her symptoms are possible side effects of the medication. Has taken Cipro before without any issues. She has no other concerns today.  HPI  Past Medical History:  Diagnosis Date  . Cancer ([place])    skin ca  . Collagen vascular disease (HCC)    RA  . Depression, major, single episode, in partial remission ([place]) 01/29/2015  . Dyspnea   . H/O total hysterectomy   . Heartburn   . HLD (hyperlipidemia)   . Hypertension   . Neuromuscular disorder (HCC)    neuropathy in Left lower leg and foot  . Neuropathy   . Rheumatoid arteritis [facility])     Patient Active Problem List   Diagnosis Date Noted  . Bunion of great toe of right foot 09/15/2018  . Current moderate episode of major depressive disorder without prior episode ([place]) 03/25/2018  . Abnormal feces   . Benign neoplasm of transverse colon   . Esophageal dysphagia   . Stricture and stenosis of esophagus   . Abnormal CXR 03/21/2017  . Chronic renal insufficiency, stage 3 (moderate) ([place]) 03/19/2017  . Venous insufficiency of both lower extremities 02/13/2017  . Herpes simplex infection 07/07/2015  . Anxiety disorder 01/29/2015  . Atrophic vaginitis  01/29/2015  . Dyslipidemia 01/29/2015  . Essential (primary) hypertension 01/29/2015  . Acid reflux 01/29/2015  . Idiopathic peripheral neuropathy 01/29/2015  . Idiopathic insomnia 01/29/2015  . Rheumatoid arthritis involving multiple joints ([place]) 01/29/2015  . Urge incontinence 01/29/2015  . Pelvic relaxation due to vaginal prolapse 01/29/2015    Past Surgical History:  Procedure Laterality Date  . ANTERIOR AND POSTERIOR VAGINAL REPAIR  04/2015  . APPENDECTOMY  1960  . BREAST CYST ASPIRATION Left    lt fna- neg  . COLONOSCOPY WITH PROPOFOL N/A 11/01/2017   Procedure: COLONOSCOPY WITH PROPOFOL;  Surgeon: Lucilla Lame, MD;  Location: [place];  Service: Endoscopy;  Laterality: N/A;  . ESOPHAGEAL DILATION  11/01/2017   Procedure: ESOPHAGEAL DILATION;  Surgeon: Lucilla Lame, MD;  Location: [place];  Service: Endoscopy;;  . ESOPHAGOGASTRODUODENOSCOPY (EGD) WITH PROPOFOL N/A 11/01/2017   Procedure: ESOPHAGOGASTRODUODENOSCOPY (EGD) WITH PROPOFOL;  Surgeon: Lucilla Lame, MD;  Location: [place];  Service: Endoscopy;  Laterality: N/A;  . LUMBAR [place] SURGERY  2011  . POLYPECTOMY  11/01/2017   Procedure: POLYPECTOMY;  Surgeon: Lucilla Lame, MD;  Location: [place];  Service: Endoscopy;;  . riight shoulder surgery Right 1999  . SHOULDER SURGERY Left 2006  . TOTAL KNEE ARTHROPLASTY Left 2011  . VAGINAL HYSTERECTOMY  04/2015    OB History   No obstetric history on file.      Home Medications    Prior to Admission medications  Medication Sig Start Date End Date Taking? Authorizing Provider  atenolol (TENORMIN) 50 MG tablet TAKE 1 TABLET BY MOUTH DAILY 10/15/17   Glean Hess, MD  Biotin 5000 MCG CAPS Take by mouth.    [provider]  ciprofloxacin (CIPRO) 250 MG tablet Take 1 tablet (250 mg total) by mouth 2 (two) times daily for 7 days. 09/15/18 09/22/18  Glean Hess, MD  cyclobenzaprine (FLEXERIL) 10 MG tablet Take 1 tablet (10  mg total) by mouth 3 (three) times daily as needed for up to 7 days for muscle spasms. 09/21/18 09/28/18  Laurene Footman B, PA-C  DULoxetine (CYMBALTA) 20 MG capsule TAKE 1 CAPSULE BY MOUTH EVERY DAY 08/14/17   [provider]  Erythromycin 333 MG TBEC Take 1 tablet (333 mg total) by mouth 3 (three) times daily. 08/28/18   Gardiner Barefoot, DPM  esomeprazole (NEXIUM) 20 MG capsule Take 1 capsule by mouth daily.    [provider]  famotidine (PEPCID) 20 MG tablet TAKE 1 TABLET BY MOUTH EVERYDAY AT BEDTIME 06/04/18   Glean Hess, MD  folic acid (FOLVITE) 1 MG tablet Take 2 tablets by mouth daily.     [provider]  hydrochlorothiazide (HYDRODIURIL) 25 MG tablet TAKE 2 TABLETS BY MOUTH EVERY DAY 07/20/18   Glean Hess, MD  methotrexate 2.5 MG tablet Take 5 tablets by mouth once a week.     [provider]  mirtazapine (REMERON) 30 MG tablet TAKE 1 TABLET (30 MG TOTAL) BY MOUTH AT BEDTIME. 07/20/18   Glean Hess, MD  phenazopyridine (PYRIDIUM) 200 MG tablet Take 1 tablet (200 mg total) by mouth 3 (three) times daily. 02/21/18   Betancourt, Aura Fey, NP  PREMARIN vaginal cream INSERT 1 APPLICATORFUL VAGINALLY AT BEDTIME 07/15/18   Glean Hess, MD  simvastatin (ZOCOR) 20 MG tablet Take 1 tablet (20 mg total) by mouth daily. 01/17/18   Glean Hess, MD  trimethoprim (TRIMPEX) 100 MG tablet Take 1 tablet (100 mg total) by mouth daily. 12/09/17   Bjorn Loser, MD  zolpidem (AMBIEN) 5 MG tablet TAKE 1 TABLET BY MOUTH EVERYDAY AT BEDTIME 08/11/18   Glean Hess, MD    Family History Family History  Problem Relation Age of Onset  . Alzheimer's disease Mother   . Arthritis Mother   . Lung cancer Father   . Stomach cancer Brother   . Prostate cancer Neg Hx   . Kidney cancer Neg Hx   . Bladder Cancer Neg Hx   . Breast cancer Neg Hx     Social History Social History   Tobacco Use  . Smoking status: Never Smoker  . Smokeless tobacco:  Never Used  . Tobacco comment: smoking cessation materials not required  Substance Use Topics  . Alcohol use: No    Alcohol/week: 0.0 standard drinks  . Drug use: No     Allergies   Nitrofuran derivatives; Codeine sulfate; Oxycodone; Propoxyphene; Doxycycline; Penicillins; and Sulfa antibiotics   Review of Systems Review of Systems  Constitutional: Negative for activity change, appetite change, chills, fatigue and fever.  HENT: Negative for congestion, ear pain, sinus pressure, sinus pain and sore throat.   Eyes: Negative for photophobia.  Respiratory: Negative for cough, chest tightness and shortness of breath.   Cardiovascular: Negative for chest pain and palpitations.  Gastrointestinal: Negative for abdominal pain, diarrhea, nausea and vomiting.  Genitourinary: Negative for difficulty urinating, dysuria and urgency.  Musculoskeletal: Positive for myalgias and neck pain. Negative  for arthralgias, back pain, gait problem and neck stiffness.  Skin: Negative for color change and rash.  Neurological: Positive for headaches. Negative for dizziness, tremors, facial asymmetry, speech difficulty, weakness and light-headedness.  Hematological: Negative for adenopathy.  Psychiatric/Behavioral: Negative for confusion.     Physical Exam Triage Vital Signs ED Triage Vitals  Enc Vitals Group     BP 09/21/18 1045 123/63     Pulse Rate 09/21/18 1045 (!) 59     Resp 09/21/18 1045 18     Temp 09/21/18 1045 97.7 F (36.5 C)     Temp Source 09/21/18 1045 Oral     SpO2 09/21/18 1045 100 %     Weight 09/21/18 1046 144 lb (65.3 kg)     Height 09/21/18 1046 5' (1.524 m)     Head Circumference --      Peak Flow --      Pain Score 09/21/18 1046 7     Pain Loc --      Pain Edu? --      Excl. in [place]? --    No data found.  Updated Vital Signs BP 123/63 (BP Location: Right Arm)   Pulse (!) 59   Temp 97.7 F (36.5 C) (Oral)   Resp 18   Ht 5' (1.524 m)   Wt 144 lb (65.3 kg)   SpO2 100%    BMI 28.12 kg/m      Physical Exam Vitals signs and nursing note reviewed.  Constitutional:      General: She is not in acute distress.    Appearance: She is well-developed and normal weight. She is not ill-appearing.  HENT:     Head: Normocephalic and atraumatic.     Mouth/Throat:     Mouth: Mucous membranes are moist.     Pharynx: Oropharynx is clear.  Eyes:     General: No scleral icterus.    Extraocular Movements: Extraocular movements intact.     Right eye: Normal extraocular motion and no nystagmus.     Left eye: Normal extraocular motion and no nystagmus.     Pupils: Pupils are equal, round, and reactive to light.  Neck:     Musculoskeletal: Neck supple. Pain with movement and muscular tenderness ( para cervical muscle tenderness bilaterally and diffusely ) present. No erythema, neck rigidity, torticollis or spinous process tenderness.     Meningeal: Brudzinski's sign and Kernig's sign absent.     Comments: Mildly reduced ROM with lateral rotation and flexion. Full ROM with flexion and extension Cardiovascular:     Rate and Rhythm: Normal rate and regular rhythm.     Heart sounds: Normal heart sounds. No murmur.  Pulmonary:     Effort: Pulmonary effort is normal. No respiratory distress.     Breath sounds: Normal breath sounds. No wheezing or rhonchi.  Abdominal:     Palpations: Abdomen is soft.     Tenderness: There is no abdominal tenderness. There is no guarding.  Lymphadenopathy:     Cervical: No cervical adenopathy.  Skin:    General: Skin is warm and dry.     Findings: No rash.  Neurological:     Mental Status: She is alert and oriented to person, place, and time. Mental status is at baseline.     Cranial Nerves: No cranial nerve deficit or facial asymmetry.     Sensory: No sensory deficit.     Motor: No weakness.     Coordination: Coordination normal.     Gait: Gait abnormal (  uses cane).     Deep Tendon Reflexes: Reflexes normal.  Psychiatric:        Mood  and Affect: Mood normal.        Speech: Speech normal.        Behavior: Behavior normal.        Cognition and Memory: Cognition is not impaired.      UC Treatments / Results  Labs (all labs ordered are listed, but only abnormal results are displayed) Labs Reviewed - No data to display  EKG None  Radiology No results found.  Procedures Procedures (including critical care time)  Medications Ordered in UC Medications - No data to display  Initial Impression / Assessment and Plan / UC Course  I have reviewed the triage vital signs and the nursing notes.  Pertinent labs & imaging results that were available during my care of the patient were reviewed by me and considered in my medical decision making (see chart for details).   Advised patient to finish the fifth day of Cipro and then stop this medication as it may have cause the muscle pain. Advised repeat UA and culture today, but patient declined. Explained she must f/u if urinary symptoms return.   Offered ketorolac injection, but patient declined today  Explained that her neck pain is likely neck strain and tension type headache. Begin cylobenzaprine--explained that this can make her drowsy so do not take while out or driving and be careful. Continue Tylenol and Motrin if needed for pain. Use warm compresses. Cautioned on when to go to ER--fever, neck stiffness, n/v, light sensitivity, worsening headache or neck pain, vision changes, numbness/weakness, tingling, etc. Patient understanding and agreeable.    Final Clinical Impressions(s) / UC Diagnoses   Final diagnoses:  Acute strain of neck muscle, initial encounter  Acute nonintractable headache, unspecified headache type     Discharge Instructions     NECK PAIN: Stressed avoiding painful activities. This can exacerbate your symptoms and make them worse. May apply heat to the areas of pain for some relief. Use medications as directed. Be aware of which medications make  you drowsy and do not drive or operate any kind of heavy machinery while using the medication. F/U with PCP for reexamination or return sooner if condition worsens or does not begin to improve over the next few days.   NECK PAIN RED FLAGS: If symptoms get worse than they are right now, they must come back sooner. If the patient has increased numbness/ tingling or notices that the numbness/tingling is affecting the legs or saddle region, they must go to ER. If they ever lose continence then they must go to ER.   HEADACHE: You were seen in clinic today for headache. Rest and take meds as directed. If at any point, the headache becomes very severe, is associated with fever, is associated with neck pain/stiffness, you feel like passing out, the headache is different from any you've have had before, there are vision changes/issues with speech/issues with balance, or numbness/weakness in a part of the body, you should be seen urgently or emergently for more serious causes of headache     ED Prescriptions    Medication Sig Dispense Auth. Provider   cyclobenzaprine (FLEXERIL) 10 MG tablet Take 1 tablet (10 mg total) by mouth 3 (three) times daily as needed for up to 7 days for muscle spasms. 21 tablet Danton Clap, PA-C     Controlled Substance Prescriptions [place] Controlled Substance Registry consulted? Not Applicable  Danton Clap, PA-C 09/21/18 1145

## 2018-09-21 NOTE — ED Triage Notes (Addendum)
Pt currently on Cipro for UTI. Started 2 days ago with neck soreness and radiating up back of head. Feels sore to touch and hurts worse with lying on a pillow.  Difficulty turning head right and left. Headache pain 7/10

## 2018-09-26 DIAGNOSIS — H6123 Impacted cerumen, bilateral: Secondary | ICD-10-CM | POA: Diagnosis not present

## 2018-09-26 DIAGNOSIS — J3489 Other specified disorders of nose and nasal sinuses: Secondary | ICD-10-CM | POA: Diagnosis not present

## 2018-09-26 DIAGNOSIS — J301 Allergic rhinitis due to pollen: Secondary | ICD-10-CM | POA: Diagnosis not present

## 2018-09-26 DIAGNOSIS — J3502 Chronic adenoiditis: Secondary | ICD-10-CM | POA: Diagnosis not present

## 2018-09-26 DIAGNOSIS — R490 Dysphonia: Secondary | ICD-10-CM | POA: Diagnosis not present

## 2018-10-13 ENCOUNTER — Other Ambulatory Visit: Payer: Self-pay | Admitting: Internal Medicine

## 2018-10-13 DIAGNOSIS — Z1231 Encounter for screening mammogram for malignant neoplasm of breast: Secondary | ICD-10-CM

## 2018-10-15 ENCOUNTER — Other Ambulatory Visit: Payer: Self-pay | Admitting: Internal Medicine

## 2018-10-29 ENCOUNTER — Encounter (INDEPENDENT_AMBULATORY_CARE_PROVIDER_SITE_OTHER): Payer: Self-pay

## 2018-10-29 ENCOUNTER — Ambulatory Visit
Admission: RE | Admit: 2018-10-29 | Discharge: 2018-10-29 | Disposition: A | Discharge status: Home / Self Care | Payer: PPO | Source: Ambulatory Visit | Attending: Internal Medicine | Admitting: Internal Medicine

## 2018-10-29 DIAGNOSIS — Z1231 Encounter for screening mammogram for malignant neoplasm of breast: Secondary | ICD-10-CM | POA: Diagnosis not present

## 2018-11-03 ENCOUNTER — Other Ambulatory Visit: Payer: Self-pay | Admitting: Internal Medicine

## 2018-12-02 ENCOUNTER — Other Ambulatory Visit: Payer: Self-pay | Admitting: Internal Medicine

## 2018-12-15 ENCOUNTER — Ambulatory Visit: Payer: PPO | Admitting: Urology

## 2018-12-16 ENCOUNTER — Telehealth: Payer: Self-pay | Admitting: Urology

## 2018-12-16 ENCOUNTER — Other Ambulatory Visit: Payer: Self-pay

## 2018-12-16 ENCOUNTER — Telehealth (INDEPENDENT_AMBULATORY_CARE_PROVIDER_SITE_OTHER): Payer: PPO | Admitting: Urology

## 2018-12-16 DIAGNOSIS — N390 Urinary tract infection, site not specified: Secondary | ICD-10-CM

## 2018-12-16 DIAGNOSIS — N3946 Mixed incontinence: Secondary | ICD-10-CM | POA: Diagnosis not present

## 2018-12-16 DIAGNOSIS — N952 Postmenopausal atrophic vaginitis: Secondary | ICD-10-CM

## 2018-12-16 NOTE — Telephone Encounter (Signed)
I will work on the [place] and then after the [place] I will get her scheduled   Sharyn Lull

## 2018-12-16 NOTE — Telephone Encounter (Signed)
Would you check with Caroline Conway's insurance to see if they cover PTNS and would you send her a brochure about PTNS?

## 2018-12-16 NOTE — Telephone Encounter (Signed)
I caught Caroline Conway in the shower this am so she asked that I call her this afternoon to do her virtual visit.  I left her a voice mail asking her to call the office.  When she does, if someone will secure chat me so I can call her back.

## 2018-12-16 NOTE — Progress Notes (Signed)
Virtual Visit via Telephone Note  I connected with Caroline Conway on 12/16/2018 at 1419  by telephone (patient deferred video visit) and verified that I am speaking with the correct person using two identifiers.  They are located at home.  I am located at my home.    This visit type was conducted due to national recommendations for restrictions regarding the COVID-19 Pandemic (e.g. social distancing).  This format is felt to be most appropriate for this patient at this time.  All issues noted in this document were discussed and addressed.  No physical exam was performed.   I discussed the limitations, risks, security and privacy concerns of performing an evaluation and management service by telephone and the availability of in person appointments. I also discussed with the patient that there may be a patient responsible charge related to this service. The patient expressed understanding and agreed to proceed.   History of Present Illness: Caroline Conway is a 78 year old female with a history of rUTI's, incontinence and vaginal atrophy who is contacted for her yearly visit.    She states she has had numerous urinary tract infections over the last year.  She states that she seeks treatment with her primary care physician as she does not want to wait 3 days for a culture result to have an antibiotic prescribed.   Reviewing her records, she has only been prescribed antibiotics 3 times through her primary care's office.  She states that she drinks 3 bottles of water a day.  She is also recently started to drink cranberry juice and take cranberry tablets.  She does not take tub baths.  She is not sexually active.  She has intermittent constipation.  She does wear pads for her incontinence.  She is currently on daily trimethoprim.    She is also experiencing incontinence.  She states that she wears depends and sometimes they are soaked.  She had been on anticholinergics in the past, but they were  ineffective.  She was also on Myrbetriq which she did state provided some benefit, but the medication became cost prohibitive.    She is applying the vaginal estrogen cream twice weekly.   Observations/Objective: + E.coli resistant to ampicillin and trimethoprim/sulfa on 04/25/2018 Prescribed Cipro on 02/12/2018, 04/25/2018 and 09/15/2018  Assessment and Plan:  1. rUTI's Encouraged her to contact us with symptoms of UTIs so that we can help manage them Having to wear incontinence pads that are damp may be contributing factor to the UTIs, hopefully her insurance will cover PTNS and this will help reduce her incontinence amount and episodes  2. Incontinence Failed anticholinergics and Myrbetriq was found to be cost prohibitive explained the PTNS provides treatment by indirectly providing electrical stimulation to the nerves responsible for bladder and pelvic floor function - a needle electrode generates an adjustable electrical pulse that travels to the sacral plexus via the tibial nerve which is located in the ankle, among other functions, the sacral nerve plexus regulates bladder and pelvic floor function - treatment protocol requires once-a-week treatments for 12 weeks, 30 minutes per session and many patients begin to see improvements by the 6th treatment. Patients who respond to treatment may require occasional treatments (~ once every 3 weeks) to sustain improvements. PTNS is a low-risk procedure. The most common side-effects with PTNS treatment are temporary and minor, resulting from the placement of the needle electrode. They include minor bleeding, mild pain and skin inflammation and patients have seen up to an 80% success  rate with this form of treatment RTC for PTNS - if insurance covers the treatments and we will also send the patient a brochure so that she may do further research on the treatment  3.  Vaginal atrophy Continue the vaginal estrogen cream twice weekly   Follow Up  Instructions:  Check with insurance regarding PTNS coverage and we will send her a brochure and will schedule the treatments once COVID-19 restrictions are lifted   I discussed the assessment and treatment plan with the patient. The patient was provided an opportunity to ask questions and all were answered. The patient agreed with the plan and demonstrated an understanding of the instructions.   The patient was advised to call back or seek an in-person evaluation if the symptoms worsen or if the condition fails to improve as anticipated.  I provided 16 minutes of non-face-to-face time during this encounter.   Elisavet Buehrer, PA-C

## 2019-01-23 ENCOUNTER — Ambulatory Visit (INDEPENDENT_AMBULATORY_CARE_PROVIDER_SITE_OTHER): Payer: PPO | Admitting: Internal Medicine

## 2019-01-23 ENCOUNTER — Other Ambulatory Visit: Payer: Self-pay

## 2019-01-23 ENCOUNTER — Encounter: Payer: Self-pay | Admitting: Internal Medicine

## 2019-01-23 VITALS — BP 112/78 | HR 56 | Ht 60.0 in | Wt 153.0 lb

## 2019-01-23 DIAGNOSIS — M069 Rheumatoid arthritis, unspecified: Secondary | ICD-10-CM | POA: Diagnosis not present

## 2019-01-23 DIAGNOSIS — E785 Hyperlipidemia, unspecified: Secondary | ICD-10-CM | POA: Diagnosis not present

## 2019-01-23 DIAGNOSIS — Z0000 Encounter for general adult medical examination without abnormal findings: Secondary | ICD-10-CM

## 2019-01-23 DIAGNOSIS — I10 Essential (primary) hypertension: Secondary | ICD-10-CM | POA: Diagnosis not present

## 2019-01-23 DIAGNOSIS — F321 Major depressive disorder, single episode, moderate: Secondary | ICD-10-CM

## 2019-01-23 DIAGNOSIS — N183 Chronic kidney disease, stage 3 unspecified: Secondary | ICD-10-CM

## 2019-01-23 DIAGNOSIS — Z1231 Encounter for screening mammogram for malignant neoplasm of breast: Secondary | ICD-10-CM | POA: Diagnosis not present

## 2019-01-23 DIAGNOSIS — J42 Unspecified chronic bronchitis: Secondary | ICD-10-CM | POA: Diagnosis not present

## 2019-01-23 DIAGNOSIS — K222 Esophageal obstruction: Secondary | ICD-10-CM | POA: Diagnosis not present

## 2019-01-23 DIAGNOSIS — N2889 Other specified disorders of kidney and ureter: Secondary | ICD-10-CM

## 2019-01-23 LAB — POCT URINALYSIS DIPSTICK
Bilirubin, UA: NEGATIVE
Glucose, UA: NEGATIVE
Ketones, UA: NEGATIVE
Leukocytes, UA: NEGATIVE
Nitrite, UA: NEGATIVE
Protein, UA: NEGATIVE
Spec Grav, UA: 1.015 (ref 1.010–1.025)
Urobilinogen, UA: 0.2 E.U./dL
pH, UA: 6 (ref 5.0–8.0)

## 2019-01-23 MED ORDER — ACYCLOVIR 200 MG PO CAPS: 200.0000 mg | ORAL_CAPSULE | Freq: Two times a day (BID) | ORAL | 5 refills | Status: AC

## 2019-01-23 MED ORDER — MELOXICAM 15 MG PO TABS: 15.0000 mg | ORAL_TABLET | Freq: Every day | ORAL | 1 refills | Status: DC

## 2019-01-23 NOTE — Progress Notes (Signed)
Date:  01/23/2019   Name:  Caroline Conway   DOB:  [birth year]   MRN:  [id number]   Chief Complaint: Annual Exam (Breast Exam) and Depression (11-phq9) Caroline Conway is a 78 y.o. female who presents today for her Complete Annual Exam. She feels fairly well. She reports exercising only what she can do at home. She reports she is sleeping fairly well.  She continues to decline pneumonia vaccines although she may change her mind after the Covid-19 pandemic Mammogram  10/2018  Depression         This is a chronic problem.  The problem occurs intermittently.  Progression since onset: slightly worse on some days due to isolation with Covid-19 restriction.  Associated symptoms include insomnia.  Associated symptoms include no fatigue and no headaches.  Past treatments include SNRIs - Serotonin and norepinephrine reuptake inhibitors.  Compliance with treatment is good.  Previous treatment provided significant relief. Hypertension  This is a chronic problem. The problem is controlled. Pertinent negatives include no chest pain, headaches, palpitations or shortness of breath. Risk factors for coronary artery disease include dyslipidemia. Past treatments include beta blockers. The current treatment provides significant improvement.  Hyperlipidemia  The problem is controlled. Pertinent negatives include no chest pain or shortness of breath. Current antihyperlipidemic treatment includes statins. The current treatment provides significant improvement of lipids.  Gastroesophageal Reflux  She reports no abdominal pain, no chest pain, no choking, no coughing, no dysphagia, no heartburn, no hoarse voice or no wheezing. This is a recurrent problem. The problem occurs rarely. Pertinent negatives include no fatigue. She has tried a PPI for the symptoms. The treatment provided significant relief.  Insomnia  Primary symptoms: no sleep disturbance.  Past treatments include medication. The treatment provided  significant (with ambien 2.5 mg) relief. PMH includes: depression.  RA - still on MTX but has not had labs done in a while due to Covid.  She also takes Mobic as needed for severe pain - usually about once a month.  Generally has minimal joint pains as long as she limits her strenuous activity. Chronic Bronchitis - has seen Pulmonary in the past, no recent issues with cough, still has some phlegm in the morning but no purulence or blood.  No wheezing or SOB.   Review of Systems  Constitutional: Negative for chills, fatigue, fever and unexpected weight change.  HENT: Positive for congestion (phlegm in the mornings) and hearing loss. Negative for hoarse voice, tinnitus, trouble swallowing and voice change.   Eyes: Negative for visual disturbance.  Respiratory: Negative for cough, choking, chest tightness, shortness of breath and wheezing.   Cardiovascular: Positive for leg swelling (goes down overnight). Negative for chest pain and palpitations.  Gastrointestinal: Negative for abdominal pain, constipation, diarrhea, dysphagia, heartburn and vomiting.  Endocrine: Negative for polydipsia and polyuria.  Genitourinary: Negative for dysuria, frequency, genital sores, vaginal bleeding and vaginal discharge.  Musculoskeletal: Positive for arthralgias and gait problem. Negative for joint swelling.  Skin: Negative for color change and rash.  Allergic/Immunologic: Negative for environmental allergies.  Neurological: Negative for dizziness, tremors, light-headedness and headaches.  Hematological: Negative for adenopathy. Does not bruise/bleed easily.  Psychiatric/Behavioral: Positive for depression and dysphoric mood. Negative for sleep disturbance. The patient has insomnia. The patient is not nervous/anxious.     Patient Active Problem List   Diagnosis Date Noted  . Chronic bronchitis ([place]) 01/23/2019  . Bunion of great toe of right foot 09/15/2018  . Current moderate episode of major depressive  disorder without prior episode ([place]) 03/25/2018  . Abnormal feces   . Benign neoplasm of transverse colon   . Esophageal dysphagia   . Stricture and stenosis of esophagus   . Abnormal CXR 03/21/2017  . Chronic renal insufficiency, stage 3 (moderate) ([place]) 03/19/2017  . Venous insufficiency of both lower extremities 02/13/2017  . Herpes simplex infection 07/07/2015  . Anxiety disorder 01/29/2015  . Atrophic vaginitis 01/29/2015  . Dyslipidemia 01/29/2015  . Essential (primary) hypertension 01/29/2015  . Acid reflux 01/29/2015  . Idiopathic peripheral neuropathy 01/29/2015  . Idiopathic insomnia 01/29/2015  . Rheumatoid arthritis involving multiple joints ([place]) 01/29/2015  . Urge incontinence 01/29/2015  . Pelvic relaxation due to vaginal prolapse 01/29/2015    Allergies  Allergen Reactions  . Nitrofuran Derivatives Diarrhea  . Codeine Sulfate Hives  . Oxycodone Itching  . Propoxyphene Hives  . Doxycycline Other (See Comments)    Headache  . Penicillins Rash  . Sulfa Antibiotics Rash    Past Surgical History:  Procedure Laterality Date  . ANTERIOR AND POSTERIOR VAGINAL REPAIR  04/2015  . APPENDECTOMY  1960  . BREAST CYST ASPIRATION Left    lt fna- neg  . COLONOSCOPY WITH PROPOFOL N/A 11/01/2017   Procedure: COLONOSCOPY WITH PROPOFOL;  Surgeon: Lucilla Lame, MD;  Location: [facility];  Service: Endoscopy;  Laterality: N/A;  . ESOPHAGEAL DILATION  11/01/2017   Procedure: ESOPHAGEAL DILATION;  Surgeon: Lucilla Lame, MD;  Location: [facility];  Service: Endoscopy;;  . ESOPHAGOGASTRODUODENOSCOPY (EGD) WITH PROPOFOL N/A 11/01/2017   Procedure: ESOPHAGOGASTRODUODENOSCOPY (EGD) WITH PROPOFOL;  Surgeon: Lucilla Lame, MD;  Location: [facility];  Service: Endoscopy;  Laterality: N/A;  . LUMBAR [place] SURGERY  2011  . POLYPECTOMY  11/01/2017   Procedure: POLYPECTOMY;  Surgeon: Lucilla Lame, MD;  Location: [facility];  Service: Endoscopy;;  . riight  shoulder surgery Right 1999  . SHOULDER SURGERY Left 2006  . TOTAL KNEE ARTHROPLASTY Left 2011  . VAGINAL HYSTERECTOMY  04/2015    Social History   Tobacco Use  . Smoking status: Never Smoker  . Smokeless tobacco: Never Used  . Tobacco comment: smoking cessation materials not required  Substance Use Topics  . Alcohol use: No    Alcohol/week: 0.0 standard drinks  . Drug use: No     Medication list has been reviewed and updated.  Current Meds  Medication Sig  . acyclovir (ZOVIRAX) 200 MG capsule Take 1 capsule (200 mg total) by mouth 2 (two) times daily.  Marland Kitchen atenolol (TENORMIN) 50 MG tablet TAKE 1 TABLET BY MOUTH EVERY DAY  . Biotin 5000 MCG CAPS Take by mouth.  . DULoxetine (CYMBALTA) 20 MG capsule TAKE 1 CAPSULE BY MOUTH EVERY DAY  . esomeprazole (NEXIUM) 20 MG capsule Take 1 capsule by mouth daily.  . folic acid (FOLVITE) 1 MG tablet Take 2 tablets by mouth daily.   . hydrochlorothiazide (HYDRODIURIL) 25 MG tablet TAKE 2 TABLETS BY MOUTH EVERY DAY  . methotrexate 2.5 MG tablet Take 5 tablets by mouth once a week.   . mirtazapine (REMERON) 30 MG tablet TAKE 1 TABLET (30 MG TOTAL) BY MOUTH AT BEDTIME.  Marland Kitchen PREMARIN vaginal cream INSERT 1 APPLICATORFUL VAGINALLY AT BEDTIME  . simvastatin (ZOCOR) 20 MG tablet Take 1 tablet (20 mg total) by mouth daily.  Marland Kitchen zolpidem (AMBIEN) 5 MG tablet TAKE 1 TABLET BY MOUTH EVERYDAY AT BEDTIME  . [DISCONTINUED] acyclovir (ZOVIRAX) 200 MG capsule TAKE 1 CAPSULE BY MOUTH TWICE A DAY  PHQ 2/9 Scores 01/23/2019 09/15/2018 04/02/2018 03/25/2018  PHQ - 2 Score 2 0 0 1  PHQ- 9 Score 11 1 0 1    BP Readings from Last 3 Encounters:  01/23/19 112/78  09/21/18 123/63  09/15/18 118/66    Physical Exam Vitals signs and nursing note reviewed.  Constitutional:      General: She is not in acute distress.    Appearance: She is well-developed.  HENT:     Head: Normocephalic and atraumatic.     Right Ear: Tympanic membrane and ear canal normal.     Left  Ear: Tympanic membrane and ear canal normal.     Nose:     Right Sinus: No maxillary sinus tenderness.     Left Sinus: No maxillary sinus tenderness.     Mouth/Throat:     Pharynx: Uvula midline.  Eyes:     General: No scleral icterus.       Right eye: No discharge.        Left eye: No discharge.     Conjunctiva/sclera: Conjunctivae normal.  Neck:     Musculoskeletal: Normal range of motion. No erythema.     Thyroid: No thyromegaly.     Vascular: No carotid bruit.  Cardiovascular:     Rate and Rhythm: Normal rate and regular rhythm.     Pulses: Normal pulses.     Heart sounds: Normal heart sounds.  Pulmonary:     Effort: Pulmonary effort is normal. No respiratory distress.     Breath sounds: No wheezing.  Chest:     Breasts:        Right: No mass, nipple discharge, skin change or tenderness.        Left: No mass, nipple discharge, skin change or tenderness.  Abdominal:     General: Bowel sounds are normal.     Palpations: Abdomen is soft.     Tenderness: There is no abdominal tenderness.  Musculoskeletal:     Thoracic back: She exhibits deformity (kyphosis).  Lymphadenopathy:     Cervical: No cervical adenopathy.  Skin:    General: Skin is warm and dry.     Findings: No rash.  Neurological:     Mental Status: She is alert and oriented to person, place, and time.     Cranial Nerves: No cranial nerve deficit.     Sensory: No sensory deficit.     Deep Tendon Reflexes: Reflexes are normal and symmetric.  Psychiatric:        Attention and Perception: Attention normal.        Mood and Affect: Mood normal.        Speech: Speech normal.        Behavior: Behavior normal.        Thought Content: Thought content normal.     Wt Readings from Last 3 Encounters:  01/23/19 153 lb (69.4 kg)  09/21/18 144 lb (65.3 kg)  09/15/18 144 lb (65.3 kg)    BP 112/78   Pulse (!) 56   Ht 5' (1.524 m)   Wt 153 lb (69.4 kg)   SpO2 97%   BMI 29.88 kg/m   Assessment and Plan: 1.  Annual physical exam Normal exam Continue healthy diet, exercise as able - POCT urinalysis dipstick  2. Encounter for screening mammogram for breast cancer Up to date  3. Current moderate episode of major depressive disorder without prior episode ([place]) Increase in sx recently - continue Cymbalta and Remeron Return if worsening  4. Essential (  primary) hypertension controlled - CBC with Differential/Platelet - TSH + free T4  5. Chronic renal insufficiency, stage 3 (moderate) (H[facility]) Check labs - Comprehensive metabolic panel  6. Stricture and stenosis of esophagus No recent sx - continue PPI - CBC with Differential/Platelet  7. Rheumatoid arthritis involving multiple joints ([place]) Continue follow up with rheumatology - meloxicam (MOBIC) 15 MG tablet; Take 1 tablet (15 mg total) by mouth daily.  Dispense: 30 tablet; Refill: 1 - CBC with Differential/Platelet - Comprehensive metabolic panel  8. Chronic bronchitis, unspecified chronic bronchitis type ([place]) stable  9. Dyslipidemia On statin therapy - Comprehensive metabolic panel - Lipid panel   Partially dictated using Editor, commissioning. Any errors are unintentional.  Halina Maidens, MD [facility]  01/23/2019

## 2019-01-24 LAB — TSH+FREE T4
Free T4: 1.18 ng/dL (ref 0.82–1.77)
TSH: 1.74 u[IU]/mL (ref 0.450–4.500)

## 2019-01-24 LAB — COMPREHENSIVE METABOLIC PANEL
ALT: 18 IU/L (ref 0–32)
AST: 14 IU/L (ref 0–40)
Albumin/Globulin Ratio: 1.6 (ref 1.2–2.2)
Albumin: 4.6 g/dL (ref 3.7–4.7)
Alkaline Phosphatase: 70 IU/L (ref 39–117)
BUN/Creatinine Ratio: 19 (ref 12–28)
BUN: 21 mg/dL (ref 8–27)
Bilirubin Total: 0.4 mg/dL (ref 0.0–1.2)
CO2: 25 mmol/L (ref 20–29)
Calcium: 9.7 mg/dL (ref 8.7–10.3)
Chloride: 99 mmol/L (ref 96–106)
Creatinine, Ser: 1.08 mg/dL — ABNORMAL HIGH (ref 0.57–1.00)
GFR calc Af Amer: 57 mL/min/{1.73_m2} — ABNORMAL LOW (ref >59)
GFR calc non Af Amer: 50 mL/min/{1.73_m2} — ABNORMAL LOW (ref >59)
Globulin, Total: 2.8 g/dL (ref 1.5–4.5)
Glucose: 92 mg/dL (ref 65–99)
Potassium: 4.3 mmol/L (ref 3.5–5.2)
Sodium: 137 mmol/L (ref 134–144)
Total Protein: 7.4 g/dL (ref 6.0–8.5)

## 2019-01-24 LAB — CBC WITH DIFFERENTIAL/PLATELET
Basophils Absolute: 0.1 10*3/uL (ref 0.0–0.2)
Basos: 1 %
EOS (ABSOLUTE): 0.3 10*3/uL (ref 0.0–0.4)
Eos: 4 %
Hematocrit: 37.7 % (ref 34.0–46.6)
Hemoglobin: 13.1 g/dL (ref 11.1–15.9)
Immature Grans (Abs): 0 10*3/uL (ref 0.0–0.1)
Immature Granulocytes: 0 %
Lymphocytes Absolute: 1.3 10*3/uL (ref 0.7–3.1)
Lymphs: 22 %
MCH: 32.4 pg (ref 26.6–33.0)
MCHC: 34.7 g/dL (ref 31.5–35.7)
MCV: 93 fL (ref 79–97)
Monocytes Absolute: 0.6 10*3/uL (ref 0.1–0.9)
Monocytes: 10 %
Neutrophils Absolute: 3.9 10*3/uL (ref 1.4–7.0)
Neutrophils: 63 %
Platelets: 281 10*3/uL (ref 150–450)
RBC: 4.04 x10E6/uL (ref 3.77–5.28)
RDW: 13.9 % (ref 11.7–15.4)
WBC: 6.2 10*3/uL (ref 3.4–10.8)

## 2019-01-24 LAB — LIPID PANEL
Chol/HDL Ratio: 2.4 ratio (ref 0.0–4.4)
Cholesterol, Total: 186 mg/dL (ref 100–199)
HDL: 77 mg/dL (ref >39)
LDL Calculated: 90 mg/dL (ref 0–99)
Triglycerides: 96 mg/dL (ref 0–149)
VLDL Cholesterol Cal: 19 mg/dL (ref 5–40)

## 2019-01-26 ENCOUNTER — Other Ambulatory Visit: Payer: Self-pay | Admitting: Internal Medicine

## 2019-01-26 DIAGNOSIS — E785 Hyperlipidemia, unspecified: Secondary | ICD-10-CM

## 2019-02-07 ENCOUNTER — Other Ambulatory Visit: Payer: Self-pay | Admitting: Internal Medicine

## 2019-02-09 ENCOUNTER — Ambulatory Visit: Payer: PPO | Admitting: Internal Medicine

## 2019-02-09 ENCOUNTER — Ambulatory Visit (INDEPENDENT_AMBULATORY_CARE_PROVIDER_SITE_OTHER): Payer: PPO | Admitting: Internal Medicine

## 2019-02-09 ENCOUNTER — Encounter: Payer: Self-pay | Admitting: Internal Medicine

## 2019-02-09 ENCOUNTER — Other Ambulatory Visit: Payer: Self-pay | Admitting: Internal Medicine

## 2019-02-09 ENCOUNTER — Other Ambulatory Visit: Payer: Self-pay

## 2019-02-09 VITALS — BP 108/78 | HR 63 | Ht 60.0 in | Wt 157.0 lb

## 2019-02-09 DIAGNOSIS — N3001 Acute cystitis with hematuria: Secondary | ICD-10-CM | POA: Diagnosis not present

## 2019-02-09 DIAGNOSIS — M069 Rheumatoid arthritis, unspecified: Secondary | ICD-10-CM | POA: Diagnosis not present

## 2019-02-09 DIAGNOSIS — M545 Low back pain, unspecified: Secondary | ICD-10-CM

## 2019-02-09 LAB — POC URINALYSIS WITH MICROSCOPIC (NON AUTO)MANUAL RESULT
Bilirubin, UA: NEGATIVE
Crystals: 0
Glucose, UA: NEGATIVE
Ketones, UA: NEGATIVE
Mucus, UA: 0
Nitrite, UA: NEGATIVE
Protein, UA: POSITIVE — AB
RBC: 0 M/uL — AB (ref 4.04–5.48)
Spec Grav, UA: 1.015 (ref 1.010–1.025)
Urobilinogen, UA: 0.2 E.U./dL
pH, UA: 5 (ref 5.0–8.0)

## 2019-02-09 MED ORDER — CIPROFLOXACIN HCL 250 MG PO TABS: 250.0000 mg | ORAL_TABLET | Freq: Two times a day (BID) | ORAL | 0 refills | Status: AC

## 2019-02-09 NOTE — Addendum Note (Signed)
Addended by: Glean Hess on: 02/09/2019 12:22 PM   Modules accepted: Orders

## 2019-02-09 NOTE — Progress Notes (Signed)
Date:  02/09/2019   Name:  Caroline Conway   DOB:  Feb 01, 1941   MRN:  [id number]   Chief Complaint: Urinary Tract Infection (Urgency. Left pelvic discomfort. Back pain. ) and Back Pain (Fallen 2 weeks ago. Was taking pants off and got foot caught and fell forward onto the floor. Hit her head. Back is still painful.)  Urinary Tract Infection   This is a recurrent problem. The problem occurs every urination. The quality of the pain is described as burning. The pain is mild. There has been no fever. Associated symptoms include chills, frequency and urgency. Pertinent negatives include no hematuria. Her past medical history is significant for recurrent UTIs.  Back Pain  This is a new problem. The current episode started 1 to 4 weeks ago. The problem occurs daily. The problem has been gradually improving since onset. The pain is present in the lumbar spine. The pain does not radiate. The pain is mild. Pertinent negatives include no abdominal pain, chest pain, fever, headaches, pelvic pain, tingling or weakness. She has tried NSAIDs, ice and heat for the symptoms. The treatment provided moderate relief.    Review of Systems  Constitutional: Positive for chills. Negative for fever.  Respiratory: Negative for shortness of breath.   Cardiovascular: Negative for chest pain and palpitations.  Gastrointestinal: Negative for abdominal pain.  Genitourinary: Positive for frequency and urgency. Negative for hematuria and pelvic pain.  Musculoskeletal: Positive for back pain, joint swelling (right hand) and myalgias.  Neurological: Negative for dizziness, tingling, weakness, light-headedness and headaches.    Patient Active Problem List   Diagnosis Date Noted  . Chronic bronchitis ([place]) 01/23/2019  . Bunion of great toe of right foot 09/15/2018  . Current moderate episode of major depressive disorder without prior episode ([place]) 03/25/2018  . Abnormal feces   . Benign neoplasm of transverse colon   .  Esophageal dysphagia   . Stricture and stenosis of esophagus   . Abnormal CXR 03/21/2017  . Chronic renal insufficiency, stage 3 (moderate) ([place]) 03/19/2017  . Venous insufficiency of both lower extremities 02/13/2017  . Herpes simplex infection 07/07/2015  . Anxiety disorder 01/29/2015  . Atrophic vaginitis 01/29/2015  . Dyslipidemia 01/29/2015  . Essential (primary) hypertension 01/29/2015  . Acid reflux 01/29/2015  . Idiopathic peripheral neuropathy 01/29/2015  . Idiopathic insomnia 01/29/2015  . Rheumatoid arthritis involving multiple joints ([place]) 01/29/2015  . Urge incontinence 01/29/2015  . Pelvic relaxation due to vaginal prolapse 01/29/2015    Allergies  Allergen Reactions  . Nitrofuran Derivatives Diarrhea  . Codeine Sulfate Hives  . Oxycodone Itching  . Propoxyphene Hives  . Doxycycline Other (See Comments)    Headache  . Penicillins Rash  . Sulfa Antibiotics Rash    Past Surgical History:  Procedure Laterality Date  . ANTERIOR AND POSTERIOR VAGINAL REPAIR  04/2015  . APPENDECTOMY  1960  . BREAST CYST ASPIRATION Left    lt fna- neg  . COLONOSCOPY WITH PROPOFOL N/A 11/01/2017   Procedure: COLONOSCOPY WITH PROPOFOL;  Surgeon: Lucilla Lame, MD;  Location: [facility];  Service: Endoscopy;  Laterality: N/A;  . ESOPHAGEAL DILATION  11/01/2017   Procedure: ESOPHAGEAL DILATION;  Surgeon: Lucilla Lame, MD;  Location: [facility];  Service: Endoscopy;;  . ESOPHAGOGASTRODUODENOSCOPY (EGD) WITH PROPOFOL N/A 11/01/2017   Procedure: ESOPHAGOGASTRODUODENOSCOPY (EGD) WITH PROPOFOL;  Surgeon: Lucilla Lame, MD;  Location: [facility];  Service: Endoscopy;  Laterality: N/A;  . LUMBAR [place] SURGERY  2011  . POLYPECTOMY  11/01/2017   Procedure: POLYPECTOMY;  Surgeon: Lucilla Lame, MD;  Location: [facility];  Service: Endoscopy;;  . riight shoulder surgery Right 1999  . SHOULDER SURGERY Left 2006  . TOTAL KNEE ARTHROPLASTY Left 2011  . VAGINAL  HYSTERECTOMY  04/2015    Social History   Tobacco Use  . Smoking status: Never Smoker  . Smokeless tobacco: Never Used  . Tobacco comment: smoking cessation materials not required  Substance Use Topics  . Alcohol use: No    Alcohol/week: 0.0 standard drinks  . Drug use: No     Medication list has been reviewed and updated.  Current Meds  Medication Sig  . acyclovir (ZOVIRAX) 200 MG capsule Take 1 capsule (200 mg total) by mouth 2 (two) times daily.  Marland Kitchen atenolol (TENORMIN) 50 MG tablet TAKE 1 TABLET BY MOUTH EVERY DAY  . Biotin 5000 MCG CAPS Take by mouth.  . DULoxetine (CYMBALTA) 20 MG capsule TAKE 1 CAPSULE BY MOUTH EVERY DAY  . esomeprazole (NEXIUM) 20 MG capsule Take 1 capsule by mouth daily.  . folic acid (FOLVITE) 1 MG tablet Take 2 tablets by mouth daily.   . hydrochlorothiazide (HYDRODIURIL) 25 MG tablet TAKE 2 TABLETS BY MOUTH EVERY DAY  . meloxicam (MOBIC) 15 MG tablet Take 1 tablet (15 mg total) by mouth daily.  . methotrexate 2.5 MG tablet Take 5 tablets by mouth once a week.   . mirtazapine (REMERON) 30 MG tablet TAKE 1 TABLET (30 MG TOTAL) BY MOUTH AT BEDTIME.  Marland Kitchen PREMARIN vaginal cream INSERT 1 APPLICATORFUL VAGINALLY AT BEDTIME  . simvastatin (ZOCOR) 20 MG tablet TAKE 1 TABLET BY MOUTH EVERY DAY  . zolpidem (AMBIEN) 5 MG tablet TAKE 1 TABLET BY MOUTH EVERYDAY AT BEDTIME    PHQ 2/9 Scores 02/09/2019 01/23/2019 09/15/2018 04/02/2018  PHQ - 2 Score 0 2 0 0  PHQ- 9 Score 0 11 1 0    BP Readings from Last 3 Encounters:  02/09/19 108/78  01/23/19 112/78  09/21/18 123/63    Physical Exam Vitals signs and nursing note reviewed.  Constitutional:      General: She is not in acute distress.    Appearance: She is well-developed.  HENT:     Head: Normocephalic and atraumatic.  Neck:     Musculoskeletal: Normal range of motion and neck supple.  Cardiovascular:     Rate and Rhythm: Normal rate and regular rhythm.  Pulmonary:     Effort: Pulmonary effort is normal. No  respiratory distress.     Breath sounds: No wheezing or rhonchi.  Abdominal:     Tenderness: There is abdominal tenderness in the suprapubic area. There is no guarding or rebound.  Musculoskeletal: Normal range of motion.     Thoracic back: She exhibits deformity (kyphosis).     Lumbar back: She exhibits tenderness (over bilateral paraspinous regions).       Arms:  Skin:    General: Skin is warm and dry.     Findings: No rash.  Neurological:     Mental Status: She is alert and oriented to person, place, and time.     Gait: Gait abnormal (walks with a cane).  Psychiatric:        Attention and Perception: Attention normal.        Mood and Affect: Mood and affect normal.        Behavior: Behavior normal.        Thought Content: Thought content normal.  Cognition and Memory: Cognition normal.     Wt Readings from Last 3 Encounters:  02/09/19 157 lb (71.2 kg)  01/23/19 153 lb (69.4 kg)  09/21/18 144 lb (65.3 kg)    BP 108/78   Pulse 63   Ht 5' (1.524 m)   Wt 157 lb (71.2 kg)   SpO2 99%   BMI 30.66 kg/m   Assessment and Plan: 1. Acute cystitis with hematuria Will send for culture Increase fluids Hold MTX tomorrow - ciprofloxacin (CIPRO) 250 MG tablet; Take 1 tablet (250 mg total) by mouth 2 (two) times daily for 7 days.  Dispense: 14 tablet; Refill: 0 - Urine Culture  2. Bilateral low back pain without sciatica, unspecified chronicity Continue topical patches, ice/heat  3. Rheumatoid arthritis involving multiple joints (HCC) Hold MTX dose tomorrow Use prednisone for flare   Partially dictated using Editor, commissioning. Any errors are unintentional.  Halina Maidens, MD [facility]  02/09/2019

## 2019-02-10 ENCOUNTER — Ambulatory Visit: Payer: PPO | Admitting: Internal Medicine

## 2019-02-13 LAB — URINE CULTURE

## 2019-02-16 DIAGNOSIS — D485 Neoplasm of uncertain behavior of skin: Secondary | ICD-10-CM | POA: Diagnosis not present

## 2019-02-16 DIAGNOSIS — L814 Other melanin hyperpigmentation: Secondary | ICD-10-CM | POA: Diagnosis not present

## 2019-02-16 DIAGNOSIS — Z86018 Personal history of other benign neoplasm: Secondary | ICD-10-CM | POA: Diagnosis not present

## 2019-02-19 DIAGNOSIS — E785 Hyperlipidemia, unspecified: Secondary | ICD-10-CM | POA: Diagnosis not present

## 2019-02-19 DIAGNOSIS — I10 Essential (primary) hypertension: Secondary | ICD-10-CM | POA: Diagnosis not present

## 2019-02-19 DIAGNOSIS — M0579 Rheumatoid arthritis with rheumatoid factor of multiple sites without organ or systems involvement: Secondary | ICD-10-CM | POA: Diagnosis not present

## 2019-02-19 DIAGNOSIS — M810 Age-related osteoporosis without current pathological fracture: Secondary | ICD-10-CM | POA: Diagnosis not present

## 2019-02-19 DIAGNOSIS — M159 Polyosteoarthritis, unspecified: Secondary | ICD-10-CM | POA: Diagnosis not present

## 2019-02-19 DIAGNOSIS — Z79899 Other long term (current) drug therapy: Secondary | ICD-10-CM | POA: Diagnosis not present

## 2019-02-20 ENCOUNTER — Telehealth: Payer: Self-pay | Admitting: Urology

## 2019-02-20 NOTE — Telephone Encounter (Signed)
Lm to cb to schedule PTNS    Sharyn Lull

## 2019-02-26 ENCOUNTER — Other Ambulatory Visit: Payer: Self-pay | Admitting: Internal Medicine

## 2019-02-26 DIAGNOSIS — F5101 Primary insomnia: Secondary | ICD-10-CM

## 2019-03-02 ENCOUNTER — Other Ambulatory Visit: Payer: Self-pay

## 2019-03-02 ENCOUNTER — Ambulatory Visit (INDEPENDENT_AMBULATORY_CARE_PROVIDER_SITE_OTHER): Payer: PPO | Admitting: Family Medicine

## 2019-03-02 DIAGNOSIS — N3946 Mixed incontinence: Secondary | ICD-10-CM

## 2019-03-02 NOTE — Progress Notes (Signed)
PTNS  Session # 1  Health & Social Factors: no change Caffeine: 3 Alcohol: 0 Daytime voids #per day: 8-10 Night-time voids #per night: 3 Urgency: mild Incontinence Episodes #per day: 3 Ankle used: right Treatment Setting: 7 Feeling/ Response: sensory Comments: Patient tolerated well  Preformed By: Elberta Leatherwood, CMA  Follow Up: 1 week #2

## 2019-03-09 ENCOUNTER — Other Ambulatory Visit: Payer: Self-pay

## 2019-03-09 ENCOUNTER — Ambulatory Visit (INDEPENDENT_AMBULATORY_CARE_PROVIDER_SITE_OTHER): Payer: PPO | Admitting: Family Medicine

## 2019-03-09 DIAGNOSIS — N3946 Mixed incontinence: Secondary | ICD-10-CM | POA: Diagnosis not present

## 2019-03-09 NOTE — Addendum Note (Signed)
Addended by: Kyra Manges on: 03/09/2019 01:59 PM   Modules accepted: Level of Service

## 2019-03-09 NOTE — Progress Notes (Signed)
PTNS  Session # 2  Health & Social Factors: no change Caffeine: 0 Alcohol: 0 Daytime voids #per day: 5-6 Night-time voids #per night: 2 Urgency: mild Incontinence Episodes #per day: 3 Ankle used: right Treatment Setting: 5 Feeling/ Response: sensory and toe flex Comments: Patient tolerated well  Preformed By: Elberta Leatherwood, CMA  Follow Up: 1 week #3

## 2019-03-16 ENCOUNTER — Ambulatory Visit: Payer: PPO

## 2019-03-21 ENCOUNTER — Other Ambulatory Visit: Payer: Self-pay | Admitting: Internal Medicine

## 2019-03-21 DIAGNOSIS — M069 Rheumatoid arthritis, unspecified: Secondary | ICD-10-CM

## 2019-03-23 ENCOUNTER — Ambulatory Visit (INDEPENDENT_AMBULATORY_CARE_PROVIDER_SITE_OTHER): Payer: PPO | Admitting: Family Medicine

## 2019-03-23 ENCOUNTER — Other Ambulatory Visit: Payer: Self-pay

## 2019-03-23 DIAGNOSIS — N3946 Mixed incontinence: Secondary | ICD-10-CM

## 2019-03-23 NOTE — Progress Notes (Signed)
PTNS  Session # 3  Health & Social Factors: no change Caffeine: 2 Alcohol: 0 Daytime voids #per day: 5 Night-time voids #per night: 2 Urgency: mild Incontinence Episodes #per day: 1 Ankle used: left Treatment Setting: 9 Feeling/ Response: Sensory Comments: Patient tolerated well  Preformed By: Elberta Leatherwood, CMA  Follow Up: 1 week #4

## 2019-03-30 ENCOUNTER — Ambulatory Visit: Payer: PPO

## 2019-04-01 ENCOUNTER — Ambulatory Visit (INDEPENDENT_AMBULATORY_CARE_PROVIDER_SITE_OTHER): Payer: PPO | Admitting: Family Medicine

## 2019-04-01 ENCOUNTER — Other Ambulatory Visit: Payer: Self-pay

## 2019-04-01 DIAGNOSIS — N3946 Mixed incontinence: Secondary | ICD-10-CM | POA: Diagnosis not present

## 2019-04-01 NOTE — Progress Notes (Signed)
PTNS  Session # 4  Health & Social Factors: no change Caffeine: 0 Alcohol: 0 Daytime voids #per day: 5 Night-time voids #per night: 2 Urgency: mild Incontinence Episodes #per day: 2 Ankle used: left Treatment Setting: 5 Feeling/ Response: sensory Comments: Patient tolerated well  Preformed By: Elberta Leatherwood, CMA  Follow Up: 1 week #5

## 2019-04-06 ENCOUNTER — Ambulatory Visit (INDEPENDENT_AMBULATORY_CARE_PROVIDER_SITE_OTHER): Payer: PPO | Admitting: Family Medicine

## 2019-04-06 ENCOUNTER — Other Ambulatory Visit: Payer: Self-pay

## 2019-04-06 DIAGNOSIS — N3946 Mixed incontinence: Secondary | ICD-10-CM

## 2019-04-06 LAB — URINALYSIS, COMPLETE
Bilirubin, UA: NEGATIVE
Glucose, UA: NEGATIVE
Nitrite, UA: POSITIVE — AB
Protein,UA: NEGATIVE
Specific Gravity, UA: 1.015 (ref 1.005–1.030)
Urobilinogen, Ur: 0.2 mg/dL (ref 0.2–1.0)
pH, UA: 6 (ref 5.0–7.5)

## 2019-04-06 LAB — MICROSCOPIC EXAMINATION: WBC, UA: >30 /hpf — AB (ref 0–5)

## 2019-04-06 MED ORDER — CIPROFLOXACIN HCL 250 MG PO TABS: 250.0000 mg | ORAL_TABLET | Freq: Two times a day (BID) | ORAL | 0 refills | Status: DC

## 2019-04-06 NOTE — Progress Notes (Signed)
PTNS  Session # 5  Health & Social Factors: no change Caffeine: 1 Alcohol: 0 Daytime voids #per day: 5 Night-time voids #per night: 2 Urgency: mild Incontinence Episodes #per day: 0 Ankle used: left Treatment Setting: 4 Feeling/ Response: sensory Comments: Patient tolerated well  Preformed By: Elberta Leatherwood, CMA  Follow Up: 1 week # 6  Patient presents today with complaints of  Urinary frequency Hard to postpone urination Urinary urgency Patient states her symptoms began about 2 days ago. She also states she had symptoms of an infection a couple weeks ago and had left over Macrodantin at home and took 4 days of the ABX.  She felt better at that time. Patient states she she has not had any Urological surgeries in the last 30 days. A urine was collected for UA, UCX. Per Dr. Matilde Sprang Cipro 250mg  BID q 14days was sent to pharmacy.

## 2019-04-08 ENCOUNTER — Ambulatory Visit (INDEPENDENT_AMBULATORY_CARE_PROVIDER_SITE_OTHER): Payer: PPO

## 2019-04-08 ENCOUNTER — Other Ambulatory Visit: Payer: Self-pay

## 2019-04-08 VITALS — BP 120/80 | HR 66 | Temp 98.2°F | Resp 16 | Ht 60.0 in | Wt 155.8 lb

## 2019-04-08 DIAGNOSIS — Z0000 Encounter for general adult medical examination without abnormal findings: Secondary | ICD-10-CM

## 2019-04-08 LAB — CULTURE, URINE COMPREHENSIVE

## 2019-04-08 NOTE — Patient Instructions (Signed)
Ms. Overacker , Thank you for taking time to come for your Medicare Wellness Visit. I appreciate your ongoing commitment to your health goals. Please review the following plan we discussed and let me know if I can assist you in the future.   Screening recommendations/referrals: Colonoscopy: done 11/01/17 Mammogram: done 10/29/18 Bone Density: done 12/26/16 Recommended yearly ophthalmology/optometry visit for glaucoma screening and checkup Recommended yearly dental visit for hygiene and checkup  Vaccinations: Influenza vaccine: done 06/10/18 Pneumococcal vaccine: postponed Tdap vaccine: done 05/31/11 Shingles vaccine: Shingrix discussed. Please contact your pharmacy for coverage information.   Advanced directives: Please bring a copy of your health care power of attorney and living will to the office at your convenience.  Conditions/risks identified: recommend increasing physical activity to at least 3 days per week  Next appointment: Please follow up in one year for your Medicare Annual Wellness visit.     Preventive Care 78 Years and Older, Female Preventive care refers to lifestyle choices and visits with your health care provider that can promote health and wellness. What does preventive care include?  A yearly physical exam. This is also called an annual well check.  Dental exams once or twice a year.  Routine eye exams. Ask your health care provider how often you should have your eyes checked.  Personal lifestyle choices, including:  Daily care of your teeth and gums.  Regular physical activity.  Eating a healthy diet.  Avoiding tobacco and drug use.  Limiting alcohol use.  Practicing safe sex.  Taking low-dose aspirin every day.  Taking vitamin and mineral supplements as recommended by your health care provider. What happens during an annual well check? The services and screenings done by your health care provider during your annual well check will depend on your  age, overall health, lifestyle risk factors, and family history of disease. Counseling  Your health care provider may ask you questions about your:  Alcohol use.  Tobacco use.  Drug use.  Emotional well-being.  Home and relationship well-being.  Sexual activity.  Eating habits.  History of falls.  Memory and ability to understand (cognition).  Work and work Statistician.  Reproductive health. Screening  You may have the following tests or measurements:  Height, weight, and BMI.  Blood pressure.  Lipid and cholesterol levels. These may be checked every 5 years, or more frequently if you are over 67 years old.  Skin check.  Lung cancer screening. You may have this screening every year starting at age 78 if you have a 30-pack-year history of smoking and currently smoke or have quit within the past 15 years.  Fecal occult blood test (FOBT) of the stool. You may have this test every year starting at age 78.  Flexible sigmoidoscopy or colonoscopy. You may have a sigmoidoscopy every 5 years or a colonoscopy every 10 years starting at age 78.  Hepatitis C blood test.  Hepatitis B blood test.  Sexually transmitted disease (STD) testing.  Diabetes screening. This is done by checking your blood sugar (glucose) after you have not eaten for a while (fasting). You may have this done every 1-3 years.  Bone density scan. This is done to screen for osteoporosis. You may have this done starting at age 78.  Mammogram. This may be done every 1-2 years. Talk to your health care provider about how often you should have regular mammograms. Talk with your health care provider about your test results, treatment options, and if necessary, the need for more tests. Vaccines  Your health care provider may recommend certain vaccines, such as:  Influenza vaccine. This is recommended every year.  Tetanus, diphtheria, and acellular pertussis (Tdap, Td) vaccine. You may need a Td booster  every 10 years.  Zoster vaccine. You may need this after age 78.  Pneumococcal 13-valent conjugate (PCV13) vaccine. One dose is recommended after age 78.  Pneumococcal polysaccharide (PPSV23) vaccine. One dose is recommended after age 78. Talk to your health care provider about which screenings and vaccines you need and how often you need them. This information is not intended to replace advice given to you by your health care provider. Make sure you discuss any questions you have with your health care provider. Document Released: 09/23/2015 Document Revised: 05/16/2016 Document Reviewed: 06/28/2015 Elsevier Interactive Patient Education  2017 [place] Prevention in the Home Falls can cause injuries. They can happen to people of all ages. There are many things you can do to make your home safe and to help prevent falls. What can I do on the outside of my home?  Regularly fix the edges of walkways and driveways and fix any cracks.  Remove anything that might make you trip as you walk through a door, such as a raised step or threshold.  Trim any bushes or trees on the path to your home.  Use bright outdoor lighting.  Clear any walking paths of anything that might make someone trip, such as rocks or tools.  Regularly check to see if handrails are loose or broken. Make sure that both sides of any steps have handrails.  Any raised decks and porches should have guardrails on the edges.  Have any leaves, snow, or ice cleared regularly.  Use sand or salt on walking paths during winter.  Clean up any spills in your garage right away. This includes oil or grease spills. What can I do in the bathroom?  Use night lights.  Install grab bars by the toilet and in the tub and shower. Do not use towel bars as grab bars.  Use non-skid mats or decals in the tub or shower.  If you need to sit down in the shower, use a plastic, non-slip stool.  Keep the floor dry. Clean up any  water that spills on the floor as soon as it happens.  Remove soap buildup in the tub or shower regularly.  Attach bath mats securely with double-sided non-slip rug tape.  Do not have throw rugs and other things on the floor that can make you trip. What can I do in the bedroom?  Use night lights.  Make sure that you have a light by your bed that is easy to reach.  Do not use any sheets or blankets that are too big for your bed. They should not hang down onto the floor.  Have a firm chair that has side arms. You can use this for support while you get dressed.  Do not have throw rugs and other things on the floor that can make you trip. What can I do in the kitchen?  Clean up any spills right away.  Avoid walking on wet floors.  Keep items that you use a lot in easy-to-reach places.  If you need to reach something above you, use a strong step stool that has a grab bar.  Keep electrical cords out of the way.  Do not use floor polish or wax that makes floors slippery. If you must use wax, use non-skid floor wax.  Do  not have throw rugs and other things on the floor that can make you trip. What can I do with my stairs?  Do not leave any items on the stairs.  Make sure that there are handrails on both sides of the stairs and use them. Fix handrails that are broken or loose. Make sure that handrails are as long as the stairways.  Check any carpeting to make sure that it is firmly attached to the stairs. Fix any carpet that is loose or worn.  Avoid having throw rugs at the top or bottom of the stairs. If you do have throw rugs, attach them to the floor with carpet tape.  Make sure that you have a light switch at the top of the stairs and the bottom of the stairs. If you do not have them, ask someone to add them for you. What else can I do to help prevent falls?  Wear shoes that:  Do not have high heels.  Have rubber bottoms.  Are comfortable and fit you well.  Are closed  at the toe. Do not wear sandals.  If you use a stepladder:  Make sure that it is fully opened. Do not climb a closed stepladder.  Make sure that both sides of the stepladder are locked into place.  Ask someone to hold it for you, if possible.  Clearly mark and make sure that you can see:  Any grab bars or handrails.  First and last steps.  Where the edge of each step is.  Use tools that help you move around (mobility aids) if they are needed. These include:  Canes.  Walkers.  Scooters.  Crutches.  Turn on the lights when you go into a dark area. Replace any light bulbs as soon as they burn out.  Set up your furniture so you have a clear path. Avoid moving your furniture around.  If any of your floors are uneven, fix them.  If there are any pets around you, be aware of where they are.  Review your medicines with your doctor. Some medicines can make you feel dizzy. This can increase your chance of falling. Ask your doctor what other things that you can do to help prevent falls. This information is not intended to replace advice given to you by your health care provider. Make sure you discuss any questions you have with your health care provider. Document Released: 06/23/2009 Document Revised: 02/02/2016 Document Reviewed: 10/01/2014 Elsevier Interactive Patient Education  2017 [employer].

## 2019-04-08 NOTE — Progress Notes (Signed)
Subjective:   Caroline Conway is a 78 y.o. female who presents for Medicare Annual (Subsequent) preventive examination.  Review of Systems:  Cardiac Risk Factors include: advanced age (>64men, >42 women);dyslipidemia;hypertension;obesity (BMI >30kg/m2)     Objective:     Vitals: BP 120/80 (BP Location: Left Arm, Patient Position: Sitting, Cuff Size: Normal)   Pulse 66   Temp 98.2 F (36.8 C) (Oral)   Resp 16   Ht 5' (1.524 m)   Wt 155 lb 12.8 oz (70.7 kg)   SpO2 98%   BMI 30.43 kg/m   Body mass index is 30.43 kg/m.  Advanced Directives 04/08/2019 09/21/2018 04/02/2018 02/21/2018 11/01/2017 05/01/2016  Does Patient Have a Medical Advance Directive? Yes Yes Yes No Yes Yes  Type of Paramedic of [place];Living will [place];Living will [place];Living will - Healthcare Power of [place];Living will  Copy of [place] in Chart? No - copy requested - No - copy requested - No - copy requested -    Tobacco Social History   Tobacco Use  Smoking Status Never Smoker  Smokeless Tobacco Never Used  Tobacco Comment   smoking cessation materials not required     Counseling given: Not Answered Comment: smoking cessation materials not required   Clinical Intake:  Pre-visit preparation completed: Yes  Pain : No/denies pain     BMI - recorded: 30.43 Nutritional Status: BMI > 30  Obese Nutritional Risks: None Diabetes: No  How often do you need to have someone help you when you read instructions, pamphlets, or other written materials from your doctor or pharmacy?: 1 - Never  Interpreter Needed?: No  Information entered by :: Clemetine Marker LPN  Past Medical History:  Diagnosis Date  . Cancer ([place])    skin ca  . Collagen vascular disease (H[facility])    RA  . Depression, major, single episode, in partial remission ([place]) 01/29/2015  . Dyspnea   . GERD (gastroesophageal  reflux disease)   . H/O total hysterectomy   . Heartburn   . HLD (hyperlipidemia)   . Hypertension   . Neuromuscular disorder (H[facility])    neuropathy in Left lower leg and foot  . Neuropathy   . Rheumatoid arteritis ([place])    Past Surgical History:  Procedure Laterality Date  . ANTERIOR AND POSTERIOR VAGINAL REPAIR  04/2015  . APPENDECTOMY  1960  . BREAST CYST ASPIRATION Left    lt fna- neg  . COLONOSCOPY WITH PROPOFOL N/A 11/01/2017   Procedure: COLONOSCOPY WITH PROPOFOL;  Surgeon: Lucilla Lame, MD;  Location: [facility];  Service: Endoscopy;  Laterality: N/A;  . ESOPHAGEAL DILATION  11/01/2017   Procedure: ESOPHAGEAL DILATION;  Surgeon: Lucilla Lame, MD;  Location: [facility];  Service: Endoscopy;;  . ESOPHAGOGASTRODUODENOSCOPY (EGD) WITH PROPOFOL N/A 11/01/2017   Procedure: ESOPHAGOGASTRODUODENOSCOPY (EGD) WITH PROPOFOL;  Surgeon: Lucilla Lame, MD;  Location: [facility];  Service: Endoscopy;  Laterality: N/A;  . LUMBAR [place] SURGERY  2011  . POLYPECTOMY  11/01/2017   Procedure: POLYPECTOMY;  Surgeon: Lucilla Lame, MD;  Location: [facility];  Service: Endoscopy;;  . riight shoulder surgery Right 1999  . SHOULDER SURGERY Left 2006  . TOTAL KNEE ARTHROPLASTY Left 2011  . VAGINAL HYSTERECTOMY  04/2015   Family History  Problem Relation Age of Onset  . Alzheimer's disease Mother   . Arthritis Mother   . Lung cancer Father   . Stomach cancer Brother   .  Prostate cancer Neg Hx   . Kidney cancer Neg Hx   . Bladder Cancer Neg Hx   . Breast cancer Neg Hx    Social History   Socioeconomic History  . Marital status: Widowed    Spouse name: Not on file  . Number of children: 2  . Years of education: some college  . Highest education level: 12th grade  Occupational History  . Occupation: Retired  Scientific laboratory technician  . Financial resource strain: Not hard at all  . Food insecurity    Worry: Never true    Inability: Never true  . Transportation needs     Medical: No    Non-medical: No  Tobacco Use  . Smoking status: Never Smoker  . Smokeless tobacco: Never Used  . Tobacco comment: smoking cessation materials not required  Substance and Sexual Activity  . Alcohol use: No    Alcohol/week: 0.0 standard drinks  . Drug use: No  . Sexual activity: Not Currently  Lifestyle  . Physical activity    Days per week: 0 days    Minutes per session: 0 min  . Stress: Not at all  Relationships  . Social connections    Talks on phone: More than three times a week    Gets together: Twice a week    Attends religious service: More than 4 times per year    Active member of club or organization: No    Attends meetings of clubs or organizations: Never    Relationship status: Widowed  Other Topics Concern  . Not on file  Social History Narrative  . Not on file    Outpatient Encounter Medications as of 04/08/2019  Medication Sig  . atenolol (TENORMIN) 50 MG tablet TAKE 1 TABLET BY MOUTH EVERY DAY  . Biotin 5000 MCG CAPS Take by mouth.  . ciprofloxacin (CIPRO) 250 MG tablet Take 1 tablet (250 mg total) by mouth 2 (two) times daily.  . DULoxetine (CYMBALTA) 20 MG capsule TAKE 1 CAPSULE BY MOUTH EVERY DAY  . esomeprazole (NEXIUM) 20 MG capsule Take 1 capsule by mouth daily. QHS  . famotidine (PEPCID) 20 MG tablet Take 1 tablet by mouth every morning.  . folic acid (FOLVITE) 1 MG tablet Take 2 tablets by mouth daily.   . hydrochlorothiazide (HYDRODIURIL) 25 MG tablet TAKE 2 TABLETS BY MOUTH EVERY DAY (Patient taking differently: Pt takes 25 mg daily)  . meloxicam (MOBIC) 15 MG tablet TAKE 1 TABLET BY MOUTH EVERY DAY (Patient taking differently: PRN)  . methotrexate 2.5 MG tablet Take 5 tablets by mouth once a week. Pt takes 9 tablets weekly  . mirtazapine (REMERON) 30 MG tablet TAKE 1 TABLET (30 MG TOTAL) BY MOUTH AT BEDTIME.  Marland Kitchen PREMARIN vaginal cream INSERT 1 APPLICATORFUL VAGINALLY AT BEDTIME  . simvastatin (ZOCOR) 20 MG tablet TAKE 1 TABLET BY MOUTH  EVERY DAY  . zolpidem (AMBIEN) 5 MG tablet TAKE 1 TABLET BY MOUTH EVERYDAY AT BEDTIME (Patient taking differently: Pt takes 1/2 tab qhs)  . acyclovir (ZOVIRAX) 200 MG capsule Take 1 capsule (200 mg total) by mouth 2 (two) times daily. (Patient not taking: Reported on 04/08/2019)  . predniSONE (DELTASONE) 5 MG tablet Take 1 tablet by mouth daily.   No facility-administered encounter medications on file as of 04/08/2019.     Activities of Daily Living In your present state of health, do you have any difficulty performing the following activities: 04/08/2019  Hearing? Y  Comment declines hearing aids  Vision? N  Comment wears glasses  Difficulty concentrating or making decisions? N  Walking or climbing stairs? N  Dressing or bathing? N  Doing errands, shopping? N  Preparing Food and eating ? N  Using the Toilet? N  In the past six months, have you accidently leaked urine? Y  Comment wears pads for protection  Do you have problems with loss of bowel control? N  Managing your Medications? N  Managing your Finances? N  Housekeeping or managing your Housekeeping? N  Some recent data might be hidden    Patient Care Team: Glean Hess, MD as PCP - General (Internal Medicine) Margaretha Sheffield, MD (Otolaryngology) Valinda Party, MD (Rheumatology) Bjorn Loser, MD as Consulting Physician (Urology)    Assessment:   This is a routine wellness examination for [place].  Exercise Activities and Dietary recommendations Current Exercise Habits: The patient does not participate in regular exercise at present, Exercise limited by: orthopedic condition(s)  Goals    . DIET - INCREASE WATER INTAKE     Recommend to drink at least 6-8 8oz glasses of water per day.    . Increase physical activity     Increase physical activity to 3 days per week when gym opens back up       Fall Risk Fall Risk  04/08/2019 02/09/2019 01/23/2019 04/02/2018 01/17/2018  Falls in the past year? 1 1 1  Yes Yes   Comment - - - fell in the yard -  Number falls in past yr: 1 1 0 2 or more 2 or more  Comment - - - - -  Injury with Fall? 1 1 0 No No  Risk Factor Category  - - - High Fall Risk High Fall Risk  Risk for fall due to : Impaired balance/gait;History of fall(s) History of fall(s);Impaired balance/gait;Impaired mobility History of fall(s);Impaired balance/gait;Impaired mobility Impaired vision;Impaired balance/gait;Medication side effect;History of fall(s) -  Risk for fall due to: Comment - - - wears eyeglasses; ambulates with cane -  Follow up Falls prevention discussed Falls evaluation completed - Falls evaluation completed;Education provided;Falls prevention discussed -   FALL RISK PREVENTION PERTAINING TO THE HOME:  Any stairs in or around the home? Yes  If so, do they handrails? Yes   Home free of loose throw rugs in walkways, pet beds, electrical cords, etc? Yes  Adequate lighting in your home to reduce risk of falls? Yes   ASSISTIVE DEVICES UTILIZED TO PREVENT FALLS:  Life alert? No  Use of a cane, walker or w/c? Yes  Grab bars in the bathroom? Yes  Shower chair or bench in shower? Yes  Elevated toilet seat or a handicapped toilet? Yes   DME ORDERS:  DME order needed?  No   TIMED UP AND GO:  Was the test performed? Yes .  Length of time to ambulate 10 feet: 7 sec.   GAIT:  Appearance of gait: Gait slow, steady and with the use of an assistive device.   Education: Fall risk prevention has been discussed.  Intervention(s) required? No    Depression Screen PHQ 2/9 Scores 04/08/2019 02/09/2019 01/23/2019 09/15/2018  PHQ - 2 Score 0 0 2 0  PHQ- 9 Score - 0 11 1     Cognitive Function     6CIT Screen 04/08/2019 04/02/2018 04/01/2017  What Year? 0 points 0 points 0 points  What month? 0 points 0 points 0 points  What time? 0 points 0 points 0 points  Count back from 20 0 points 0 points 0  points  Months in reverse 0 points 0 points 0 points  Repeat phrase 0 points 0  points 0 points  Total Score 0 0 0    Immunization History  Administered Date(s) Administered  . Influenza,inj,Quad PF,6+ Mos 07/07/2015  . Influenza-Unspecified 06/10/2017, 06/10/2018  . Tdap 05/31/2011    Qualifies for Shingles Vaccine? Yes . Due for Shingrix. Education has been provided regarding the importance of this vaccine. Pt has been advised to call insurance company to determine out of pocket expense. Advised may also receive vaccine at local pharmacy or Health Dept. Verbalized acceptance and understanding.  Tdap: Up to date  Flu Vaccine: Up to date  Pneumococcal Vaccine: Due for Pneumococcal vaccine. Does the patient want to receive this vaccine today?  No . Education has been provided regarding the importance of this vaccine but still declined. Advised may receive this vaccine at local pharmacy or Health Dept. Aware to provide a copy of the vaccination record if obtained from local pharmacy or Health Dept. Verbalized acceptance and understanding.   Screening Tests Health Maintenance  Topic Date Due  . INFLUENZA VACCINE  04/11/2019  . MAMMOGRAM  10/30/2019  . TETANUS/TDAP  05/30/2021  . DEXA SCAN  Completed  . PNA vac Low Risk Adult  Completed   Cancer Screenings:  Colorectal Screening: Completed 11/01/17. No longer required.   Mammogram: Completed 10/29/18. Repeat every year  Bone Density: Completed 12/26/16. Results reflect unavailable. Repeat every 2 years. Ordered by rheumatologist, pt has appt in Sept.   Lung Cancer Screening: (Low Dose CT Chest recommended if Age 16-80 years, 30 pack-year currently smoking OR have quit w/in 15years.) does not qualify.    Additional Screening:  Hepatitis C Screening: no longer required  Vision Screening: Recommended annual ophthalmology exams for early detection of glaucoma and other disorders of the eye. Is the patient up to date with their annual eye exam?  Yes  Who is the provider or what is the name of the office in  which the pt attends annual eye exams? [facility] Screening: Recommended annual dental exams for proper oral hygiene  Community Resource Referral:  CRR required this visit?  No      Plan:     I have personally reviewed and addressed the Medicare Annual Wellness questionnaire and have noted the following in the patient's chart:  A. Medical and social history B. Use of alcohol, tobacco or illicit drugs  C. Current medications and supplements D. Functional ability and status E.  Nutritional status F.  Physical activity G. Advance directives H. List of other physicians I.  Hospitalizations, surgeries, and ER visits in previous 12 months J.  [place] such as hearing and vision if needed, cognitive and depression L. Referrals and appointments   In addition, I have reviewed and discussed with patient certain preventive protocols, quality metrics, and best practice recommendations. A written personalized care plan for preventive services as well as general preventive health recommendations were provided to patient.   Signed,  Clemetine Marker, LPN Nurse Health Advisor   Nurse Notes: pt c/o bladder infection, currently on abx. She also c/o recent RA flares, managed by rheumatologist.

## 2019-04-13 ENCOUNTER — Other Ambulatory Visit: Payer: Self-pay

## 2019-04-13 ENCOUNTER — Ambulatory Visit (INDEPENDENT_AMBULATORY_CARE_PROVIDER_SITE_OTHER): Payer: PPO

## 2019-04-13 DIAGNOSIS — N3946 Mixed incontinence: Secondary | ICD-10-CM | POA: Diagnosis not present

## 2019-04-13 DIAGNOSIS — I872 Venous insufficiency (chronic) (peripheral): Secondary | ICD-10-CM

## 2019-04-13 NOTE — Progress Notes (Signed)
PTNS  Session # 6  Health & Social Factors: no change- patient is finishing antibiotics given to her last week for UTI. Caffeine: 1 Alcohol: 0 Daytime voids #per day: 5 Night-time voids #per night: 1 Urgency: mild Incontinence Episodes #per day: 0 Ankle used: left Treatment Setting: 7 Feeling/ Response: sensory Comments: Patient tolerated well.  Preformed By: Shawnie Dapper, CMA   Follow Up: as scheduled.

## 2019-04-20 ENCOUNTER — Other Ambulatory Visit: Payer: Self-pay

## 2019-04-20 ENCOUNTER — Ambulatory Visit (INDEPENDENT_AMBULATORY_CARE_PROVIDER_SITE_OTHER): Payer: PPO | Admitting: Family Medicine

## 2019-04-20 DIAGNOSIS — N3946 Mixed incontinence: Secondary | ICD-10-CM | POA: Diagnosis not present

## 2019-04-20 NOTE — Progress Notes (Signed)
PTNS  Session # 6  Health & Social Factors: no change Caffeine: 1 Alcohol: 0 Daytime voids #per day: 5 Night-time voids #per night: 2 Urgency: mild Incontinence Episodes #per day: 0 Ankle used: left Treatment Setting: 13 Feeling/ Response: sensory Comments: Patient tolerated well  Preformed By: Elberta Leatherwood, CMA  Follow Up: 1 week #7

## 2019-04-23 DIAGNOSIS — H1859 Other hereditary corneal dystrophies: Secondary | ICD-10-CM | POA: Diagnosis not present

## 2019-04-27 ENCOUNTER — Other Ambulatory Visit: Payer: Self-pay

## 2019-04-27 ENCOUNTER — Ambulatory Visit (INDEPENDENT_AMBULATORY_CARE_PROVIDER_SITE_OTHER): Payer: PPO | Admitting: Family Medicine

## 2019-04-27 DIAGNOSIS — N3946 Mixed incontinence: Secondary | ICD-10-CM

## 2019-04-27 NOTE — Progress Notes (Signed)
PTNS  Session # 7  Health & Social Factors: no change Caffeine: 1 Alcohol: 0 Daytime voids #per day: 5 Night-time voids #per night: 2 Urgency: mild Incontinence Episodes #per day: 0 Ankle used: left Treatment Setting: 2 Feeling/ Response: sensory Comments: Patient tolerated well  Preformed By: Elberta Leatherwood, CMA  Follow Up: 1 week #8

## 2019-05-01 ENCOUNTER — Other Ambulatory Visit: Payer: Self-pay

## 2019-05-01 ENCOUNTER — Encounter (INDEPENDENT_AMBULATORY_CARE_PROVIDER_SITE_OTHER): Payer: Self-pay | Admitting: Vascular Surgery

## 2019-05-01 ENCOUNTER — Ambulatory Visit (INDEPENDENT_AMBULATORY_CARE_PROVIDER_SITE_OTHER): Payer: PPO | Admitting: Vascular Surgery

## 2019-05-01 VITALS — BP 146/76 | HR 64 | Resp 16 | Ht 60.0 in | Wt 157.0 lb

## 2019-05-01 DIAGNOSIS — M79605 Pain in left leg: Secondary | ICD-10-CM

## 2019-05-01 DIAGNOSIS — I10 Essential (primary) hypertension: Secondary | ICD-10-CM | POA: Diagnosis not present

## 2019-05-01 DIAGNOSIS — N183 Chronic kidney disease, stage 3 unspecified: Secondary | ICD-10-CM

## 2019-05-01 DIAGNOSIS — M79604 Pain in right leg: Secondary | ICD-10-CM

## 2019-05-01 DIAGNOSIS — I872 Venous insufficiency (chronic) (peripheral): Secondary | ICD-10-CM | POA: Diagnosis not present

## 2019-05-01 DIAGNOSIS — M79609 Pain in unspecified limb: Secondary | ICD-10-CM | POA: Insufficient documentation

## 2019-05-01 NOTE — Assessment & Plan Note (Signed)
Legs have the typical appearance of chronic venous insufficiency.  Venous work-up as described below.  Compression, elevation, and increasing activity would all be of benefit.

## 2019-05-01 NOTE — Progress Notes (Signed)
Patient ID: Caroline Conway, female   DOB: April 15, 1941, 78 y.o.   MRN: [id number]  Chief Complaint  Patient presents with  . New Patient (Initial Visit)    ref Army Melia for ble venous insufficiency    HPI Caroline Conway is a 78 y.o. female.  I am asked to see the patient by Dr. Army Melia for evaluation of leg pain, swelling, and discoloration.  The patient reports these changes have been gradual over several years.  She is noticing more aching and tiredness in her legs particular at the end of the day.  She describes pins-and-needles sensation in her feet and toes as well as aching and soreness in her lower legs.  Activity does seem to make things worse.  She says a home health nurse put a probe on her toes and told her that her circulation was poor but she does not really know the numbers.  She does not have any ulceration.  She does not have any infection.  Both legs are affected about the same.  Nothing is really made it much better.  The darkening and discoloration of the calves and lower legs has been a gradual process over time.  No history of DVT or superficial thrombophlebitis to her knowledge.  She has had previous back surgeries and was told she had nerve damage from her back surgeries as well.   Past Medical History:  Diagnosis Date  . Cancer ([place])    skin ca  . Collagen vascular disease ([facility])    RA  . Depression, major, single episode, in partial remission ([place]) 01/29/2015  . Dyspnea   . GERD (gastroesophageal reflux disease)   . H/O total hysterectomy   . Heartburn   . HLD (hyperlipidemia)   . Hypertension   . Neuromuscular disorder ([facility])    neuropathy in Left lower leg and foot  . Neuropathy   . Rheumatoid arteritis ([place])     Past Surgical History:  Procedure Laterality Date  . ANTERIOR AND POSTERIOR VAGINAL REPAIR  04/2015  . APPENDECTOMY  1960  . BREAST CYST ASPIRATION Left    lt fna- neg  . COLONOSCOPY WITH PROPOFOL N/A 11/01/2017   Procedure: COLONOSCOPY  WITH PROPOFOL;  Surgeon: Lucilla Lame, MD;  Location: [facility];  Service: Endoscopy;  Laterality: N/A;  . ESOPHAGEAL DILATION  11/01/2017   Procedure: ESOPHAGEAL DILATION;  Surgeon: Lucilla Lame, MD;  Location: [facility];  Service: Endoscopy;;  . ESOPHAGOGASTRODUODENOSCOPY (EGD) WITH PROPOFOL N/A 11/01/2017   Procedure: ESOPHAGOGASTRODUODENOSCOPY (EGD) WITH PROPOFOL;  Surgeon: Lucilla Lame, MD;  Location: [facility];  Service: Endoscopy;  Laterality: N/A;  . LUMBAR [place] SURGERY  2011  . POLYPECTOMY  11/01/2017   Procedure: POLYPECTOMY;  Surgeon: Lucilla Lame, MD;  Location: [facility];  Service: Endoscopy;;  . riight shoulder surgery Right 1999  . SHOULDER SURGERY Left 2006  . TOTAL KNEE ARTHROPLASTY Left 2011  . VAGINAL HYSTERECTOMY  04/2015    Family History Family History  Problem Relation Age of Onset  . Alzheimer's disease Mother   . Arthritis Mother   . Lung cancer Father   . Stomach cancer Brother   . Prostate cancer Neg Hx   . Kidney cancer Neg Hx   . Bladder Cancer Neg Hx   . Breast cancer Neg Hx     Social History Social History   Tobacco Use  . Smoking status: Never Smoker  . Smokeless tobacco: Never Used  . Tobacco comment: smoking cessation materials  not required  Substance Use Topics  . Alcohol use: No    Alcohol/week: 0.0 standard drinks  . Drug use: No    Allergies  Allergen Reactions  . Nitrofuran Derivatives Diarrhea  . Codeine Sulfate Hives  . Oxycodone Itching  . Propoxyphene Hives  . Doxycycline Other (See Comments)    Headache  . Penicillins Rash  . Sulfa Antibiotics Rash    Current Outpatient Medications  Medication Sig Dispense Refill  . atenolol (TENORMIN) 50 MG tablet TAKE 1 TABLET BY MOUTH EVERY DAY 90 tablet 3  . Biotin 5000 MCG CAPS Take by mouth.    . ciprofloxacin (CIPRO) 250 MG tablet Take 1 tablet (250 mg total) by mouth 2 (two) times daily. 14 tablet 0  . DULoxetine (CYMBALTA) 20 MG capsule  TAKE 1 CAPSULE BY MOUTH EVERY DAY    . esomeprazole (NEXIUM) 20 MG capsule Take 1 capsule by mouth daily. QHS    . famotidine (PEPCID) 20 MG tablet Take 1 tablet by mouth every morning.    . folic acid (FOLVITE) 1 MG tablet Take 2 tablets by mouth daily.     . hydrochlorothiazide (HYDRODIURIL) 25 MG tablet TAKE 2 TABLETS BY MOUTH EVERY DAY (Patient taking differently: Pt takes 25 mg daily) 180 tablet 3  . meloxicam (MOBIC) 15 MG tablet TAKE 1 TABLET BY MOUTH EVERY DAY (Patient taking differently: PRN) 30 tablet 1  . methotrexate 2.5 MG tablet Take 5 tablets by mouth once a week. Pt takes 9 tablets weekly    . mirtazapine (REMERON) 30 MG tablet TAKE 1 TABLET (30 MG TOTAL) BY MOUTH AT BEDTIME. 90 tablet 1  . predniSONE (DELTASONE) 5 MG tablet Take 1 tablet by mouth daily.    Marland Kitchen PREMARIN vaginal cream INSERT 1 APPLICATORFUL VAGINALLY AT BEDTIME 30 g 0  . simvastatin (ZOCOR) 20 MG tablet TAKE 1 TABLET BY MOUTH EVERY DAY 90 tablet 4  . zolpidem (AMBIEN) 5 MG tablet TAKE 1 TABLET BY MOUTH EVERYDAY AT BEDTIME (Patient taking differently: Pt takes 1/2 tab qhs) 30 tablet 5  . acyclovir (ZOVIRAX) 200 MG capsule Take 1 capsule (200 mg total) by mouth 2 (two) times daily. (Patient not taking: Reported on 04/08/2019) 20 capsule 5   No current facility-administered medications for this visit.       REVIEW OF SYSTEMS (Negative unless checked)  Constitutional: [] Weight loss  [] Fever  [] Chills Cardiac: [] Chest pain   [] Chest pressure   [] Palpitations   [] Shortness of breath when laying flat   [] Shortness of breath at rest   [] Shortness of breath with exertion. Vascular:  [x] Pain in legs with walking   [x] Pain in legs at rest   [] Pain in legs when laying flat   [] Claudication   [] Pain in feet when walking  [] Pain in feet at rest  [] Pain in feet when laying flat   [] History of DVT   [] Phlebitis   [x] Swelling in legs   [] Varicose veins   [] Non-healing ulcers Pulmonary:   [] Uses home oxygen   [] Productive cough    [] Hemoptysis   [] Wheeze  [] COPD   [] Asthma Neurologic:  [] Dizziness  [] Blackouts   [] Seizures   [] History of stroke   [] History of TIA  [] Aphasia   [] Temporary blindness   [] Dysphagia   [] Weakness or numbness in arms   [x] Weakness or numbness in legs Musculoskeletal:  [x] Arthritis   [] Joint swelling   [x] Joint pain   [x] Low back pain Hematologic:  [] Easy bruising  [] Easy bleeding   [] Hypercoagulable state   []   Anemic  [] Hepatitis Gastrointestinal:  [] Blood in stool   [] Vomiting blood  [x] Gastroesophageal reflux/heartburn   [] Abdominal pain Genitourinary:  [] Chronic kidney disease   [] Difficult urination  [] Frequent urination  [] Burning with urination   [] Hematuria Skin:  [] Rashes   [] Ulcers   [] Wounds Psychological:  [] History of anxiety   [x]  History of major depression.    Physical Exam BP (!) 146/76 (BP Location: Right Arm)   Pulse 64   Resp 16   Ht 5' (1.524 m)   Wt 157 lb (71.2 kg)   BMI 30.66 kg/m  Gen:  WD/WN, NAD.  Appears younger than stated age Head: [place]/AT, No temporalis wasting. Prominent temp pulse not noted. Ear/Nose/Throat: Hearing grossly intact, nares w/o erythema or drainage, oropharynx w/o Erythema/Exudate Eyes: Conjunctiva clear, sclera non-icteric  Neck: trachea midline.  No JVD.  Pulmonary:  Good air movement, respirations not labored, no use of accessory muscles  Cardiac: RRR, no JVD Vascular:  Vessel Right Left  Radial Palpable Palpable                          DP 1+ 1+  PT Trace  NP   Gastrointestinal:. No masses, surgical incisions, or scars. Musculoskeletal: M/S 5/5 throughout.  Extremities without ischemic changes.  No deformity or atrophy.  1+ right lower extremity edema, 1-2+ left lower extremity edema.  Moderate stasis dermatitis changes present bilaterally a little worse on the left than the right Neurologic: Sensation grossly intact in extremities.  Symmetrical.  Speech is fluent. Motor exam as listed above. Psychiatric: Judgment intact, Mood &  affect appropriate for pt's clinical situation. Dermatologic: No rashes or ulcers noted.  No cellulitis or open wounds.    Radiology No results found.  Labs Recent Results (from the past 2160 hour(s))  Urine Culture     Status: None   Collection Time: 02/09/19 12:00 AM  Result Value Ref Range   Urine Culture, Routine Final report    Organism ID, Bacteria Comment     Comment: Culture shows less than 10,000 colony forming units of bacteria per milliliter of urine. This colony count is not generally considered to be clinically significant.   POC urinalysis w microscopic (non auto)     Status: Abnormal   Collection Time: 02/09/19 12:21 PM  Result Value Ref Range   Color, UA yellow    Clarity, UA turbid    Glucose, UA Negative Negative   Bilirubin, UA neg    Ketones, UA neg    Spec Grav, UA 1.015 1.010 - 1.025   Blood, UA large    pH, UA 5.0 5.0 - 8.0   Protein, UA Positive (A) Negative   Urobilinogen, UA 0.2 0.2 or 1.0 E.U./dL   Nitrite, UA neg    Leukocytes, UA Large (3+) (A) Negative   RBC 0 (A) 4.04 - 5.48 M/uL   WBC Casts, UA TNTC    Mucus, UA 0    Crystals 0    Bacteria, UA 2+    Epithelial cells, urine per micros few   Urinalysis, Complete     Status: Abnormal   Collection Time: 04/06/19  2:12 PM  Result Value Ref Range   Specific Gravity, UA 1.015 1.005 - 1.030   pH, UA 6.0 5.0 - 7.5   Color, UA Yellow Yellow   Appearance Ur Cloudy (A) Clear   Leukocytes,UA 2+ (A) Negative   Protein,UA Negative Negative/Trace   Glucose, UA Negative Negative   Ketones, UA Trace (  A) Negative   RBC, UA 1+ (A) Negative   Bilirubin, UA Negative Negative   Urobilinogen, Ur 0.2 0.2 - 1.0 mg/dL   Nitrite, UA Positive (A) Negative   Microscopic Examination See below:   Microscopic Examination     Status: Abnormal   Collection Time: 04/06/19  2:12 PM   URINE  Result Value Ref Range   WBC, UA >30 (A) 0 - 5 /hpf   RBC 0-2 0 - 2 /hpf   Epithelial Cells (non renal) 0-10 0 - 10 /hpf    Renal Epithel, UA 0-10 (A) None seen /hpf   Bacteria, UA Many (A) None seen/Few  CULTURE, URINE COMPREHENSIVE     Status: Abnormal   Collection Time: 04/06/19  2:13 PM   Specimen: Urine   UR  Result Value Ref Range   Urine Culture, Comprehensive Final report (A)    Organism ID, Bacteria Escherichia coli (A)     Comment: Greater than 100,000 colony forming units per mL Cefazolin <=4 ug/mL Cefazolin with an MIC <=16 predicts susceptibility to the oral agents cefaclor, cefdinir, cefpodoxime, cefprozil, cefuroxime, cephalexin, and loracarbef when used for therapy of uncomplicated urinary tract infections due to E. coli, Klebsiella pneumoniae, and Proteus mirabilis.    ANTIMICROBIAL SUSCEPTIBILITY Comment     Comment:       ** S = Susceptible; I = Intermediate; R = Resistant **                    P = Positive; N = Negative             MICS are expressed in micrograms per mL    Antibiotic                 RSLT#1    RSLT#2    RSLT#3    RSLT#4 Amoxicillin/Clavulanic Acid    S Ampicillin                     I Cefepime                       S Ceftriaxone                    S Cefuroxime                     S Ciprofloxacin                  S Ertapenem                      S Gentamicin                     S Imipenem                       S Levofloxacin                   S Meropenem                      S Nitrofurantoin                 S Piperacillin/Tazobactam        S Tetracycline                   S Tobramycin  S Trimethoprim/Sulfa             R     Assessment/Plan:  Essential (primary) hypertension blood pressure control important in reducing the progression of atherosclerotic disease. On appropriate oral medications.   Venous insufficiency of both lower extremities Legs have the typical appearance of chronic venous insufficiency.  Venous work-up as described below.  Compression, elevation, and increasing activity would all be of benefit.  Chronic renal  insufficiency, stage 3 (moderate) Could be contributing to lower extremity swelling as well  Pain in limb  Recommend:  The patient has atypical pain symptoms for pure atherosclerotic disease. However, on physical exam there is evidence of mixed venous and arterial disease, given the diminished pulses and the edema associated with venous changes of the legs.  Noninvasive studies including ABI's and venous ultrasound of the legs will be obtained and the patient will follow up with me to review these studies.  The patient should continue walking and begin a more formal exercise program. The patient should continue his antiplatelet therapy and aggressive treatment of the lipid abnormalities.  The patient should begin wearing graduated compression socks 15-20 mmHg strength to control edema.       Leotis Pain 05/01/2019, 12:20 PM   This note was created with Dragon medical transcription system.  Any errors from dictation are unintentional.

## 2019-05-01 NOTE — Assessment & Plan Note (Signed)
blood pressure control important in reducing the progression of atherosclerotic disease. On appropriate oral medications.  

## 2019-05-01 NOTE — Assessment & Plan Note (Signed)
Could be contributing to lower extremity swelling as well

## 2019-05-01 NOTE — Patient Instructions (Signed)

## 2019-05-01 NOTE — Assessment & Plan Note (Signed)

## 2019-05-04 ENCOUNTER — Other Ambulatory Visit: Payer: Self-pay

## 2019-05-04 ENCOUNTER — Ambulatory Visit (INDEPENDENT_AMBULATORY_CARE_PROVIDER_SITE_OTHER): Payer: PPO | Admitting: Physician Assistant

## 2019-05-04 DIAGNOSIS — N3946 Mixed incontinence: Secondary | ICD-10-CM

## 2019-05-04 NOTE — Progress Notes (Signed)
PTNS  Session # 9  Health & Social Factors: no change Caffeine: 2 Alcohol: 0 Daytime voids #per day: 5 Night-time voids #per night: 2 Urgency: strong Incontinence Episodes #per day: 2 Ankle used: left Treatment Setting: 7 Feeling/ Response: both Comments: Patient tolerated procedure well  Performed By: Debroah Loop, PA-C  Assistant: Elberta Leatherwood, CMA  Follow Up: 1 week for PTNS 10

## 2019-05-08 ENCOUNTER — Ambulatory Visit (INDEPENDENT_AMBULATORY_CARE_PROVIDER_SITE_OTHER): Payer: PPO

## 2019-05-08 ENCOUNTER — Encounter (INDEPENDENT_AMBULATORY_CARE_PROVIDER_SITE_OTHER): Payer: Self-pay | Admitting: Nurse Practitioner

## 2019-05-08 ENCOUNTER — Other Ambulatory Visit: Payer: Self-pay

## 2019-05-08 ENCOUNTER — Ambulatory Visit (INDEPENDENT_AMBULATORY_CARE_PROVIDER_SITE_OTHER): Payer: PPO | Admitting: Nurse Practitioner

## 2019-05-08 VITALS — BP 148/78 | HR 66 | Resp 16 | Ht 60.0 in | Wt 157.0 lb

## 2019-05-08 DIAGNOSIS — M79604 Pain in right leg: Secondary | ICD-10-CM

## 2019-05-08 DIAGNOSIS — I739 Peripheral vascular disease, unspecified: Secondary | ICD-10-CM

## 2019-05-08 DIAGNOSIS — M79605 Pain in left leg: Secondary | ICD-10-CM

## 2019-05-08 DIAGNOSIS — I10 Essential (primary) hypertension: Secondary | ICD-10-CM | POA: Diagnosis not present

## 2019-05-08 DIAGNOSIS — I872 Venous insufficiency (chronic) (peripheral): Secondary | ICD-10-CM

## 2019-05-10 ENCOUNTER — Encounter (INDEPENDENT_AMBULATORY_CARE_PROVIDER_SITE_OTHER): Payer: Self-pay | Admitting: Nurse Practitioner

## 2019-05-10 DIAGNOSIS — I739 Peripheral vascular disease, unspecified: Secondary | ICD-10-CM | POA: Insufficient documentation

## 2019-05-10 NOTE — Progress Notes (Signed)
SUBJECTIVE:  Patient ID: Caroline Conway, female    DOB: 1940/12/09, 78 y.o.   MRN: [id number] Chief Complaint  Patient presents with  . Follow-up    ultrasound    HPI  Caroline Conway is a 78 y.o. female Patient is seen for evaluation of leg pain and leg swelling. The patient first noticed the swelling remotely. The swelling is associated with pain and discoloration. The pain and swelling worsens with prolonged dependency and improves with elevation. The pain is unrelated to activity.  The patient notes that in the morning the legs are significantly improved but they steadily worsened throughout the course of the day. The patient also notes a steady worsening of the discoloration in the ankle and shin area.   The patient denies claudication symptoms.  The patient denies symptoms consistent with rest pain.  The patient denies and extensive history of DJD and LS spine disease.  The patient has no had any past angiography, interventions or vascular surgery.  Elevation makes the leg symptoms better, dependency makes them much worse. There is no history of ulcerations. The patient denies any recent changes in medications.  The patient has not been wearing graduated compression.  The patient denies a history of DVT or PE. There is no prior history of phlebitis. There is no history of primary lymphedema.   Patient has a known history of neuropathy with left worse than the right No history of malignancies. No history of trauma or groin or pelvic surgery. There is no history of radiation treatment to the groin or pelvis  The patient denies amaurosis fugax or recent TIA symptoms. There are no recent neurological changes noted. The patient denies recent episodes of angina or shortness of breath   Today noninvasive studies showed an ABI of 1.11 on the right and 1.06 on the left.  The bilateral tibial artery waveforms are triphasic.  However, the toe waveforms bilaterally are dampened with  the left being worse than the right.  Arterial Doppler waveforms at the ankles suggest some component of arterial occlusive disease.  The patient has reflux in the common femoral vein and great saphenous vein bilaterally.  The left lower extremity has reflux in the saphenofemoral junction.  There is also evidence of bilateral chronic superficial thrombophlebitis in the bilateral small saphenous veins.  No evidence of DVT bilaterally. Past Medical History:  Diagnosis Date  . Cancer ([place])    skin ca  . Collagen vascular disease (HCC)    RA  . Depression, major, single episode, in partial remission ([place]) 01/29/2015  . Dyspnea   . GERD (gastroesophageal reflux disease)   . H/O total hysterectomy   . Heartburn   . HLD (hyperlipidemia)   . Hypertension   . Neuromuscular disorder (HCC)    neuropathy in Left lower leg and foot  . Neuropathy   . Rheumatoid arteritis ([place])     Past Surgical History:  Procedure Laterality Date  . ANTERIOR AND POSTERIOR VAGINAL REPAIR  04/2015  . APPENDECTOMY  1960  . BREAST CYST ASPIRATION Left    lt fna- neg  . COLONOSCOPY WITH PROPOFOL N/A 11/01/2017   Procedure: COLONOSCOPY WITH PROPOFOL;  Surgeon: Lucilla Lame, MD;  Location: [facility];  Service: Endoscopy;  Laterality: N/A;  . ESOPHAGEAL DILATION  11/01/2017   Procedure: ESOPHAGEAL DILATION;  Surgeon: Lucilla Lame, MD;  Location: [facility];  Service: Endoscopy;;  . ESOPHAGOGASTRODUODENOSCOPY (EGD) WITH PROPOFOL N/A 11/01/2017   Procedure: ESOPHAGOGASTRODUODENOSCOPY (EGD) WITH PROPOFOL;  Surgeon:  Lucilla Lame, MD;  Location: [facility];  Service: Endoscopy;  Laterality: N/A;  . LUMBAR [place] SURGERY  2011  . POLYPECTOMY  11/01/2017   Procedure: POLYPECTOMY;  Surgeon: Lucilla Lame, MD;  Location: [facility];  Service: Endoscopy;;  . riight shoulder surgery Right 1999  . SHOULDER SURGERY Left 2006  . TOTAL KNEE ARTHROPLASTY Left 2011  . VAGINAL HYSTERECTOMY  04/2015     Social History   Socioeconomic History  . Marital status: Widowed    Spouse name: Not on file  . Number of children: 2  . Years of education: some college  . Highest education level: 12th grade  Occupational History  . Occupation: Retired  Scientific laboratory technician  . Financial resource strain: Not hard at all  . Food insecurity    Worry: Never true    Inability: Never true  . Transportation needs    Medical: No    Non-medical: No  Tobacco Use  . Smoking status: Never Smoker  . Smokeless tobacco: Never Used  . Tobacco comment: smoking cessation materials not required  Substance and Sexual Activity  . Alcohol use: No    Alcohol/week: 0.0 standard drinks  . Drug use: No  . Sexual activity: Not Currently  Lifestyle  . Physical activity    Days per week: 0 days    Minutes per session: 0 min  . Stress: Not at all  Relationships  . Social connections    Talks on phone: More than three times a week    Gets together: Twice a week    Attends religious service: More than 4 times per year    Active member of club or organization: No    Attends meetings of clubs or organizations: Never    Relationship status: Widowed  . Intimate partner violence    Fear of current or ex partner: No    Emotionally abused: No    Physically abused: No    Forced sexual activity: No  Other Topics Concern  . Not on file  Social History Narrative  . Not on file    Family History  Problem Relation Age of Onset  . Alzheimer's disease Mother   . Arthritis Mother   . Lung cancer Father   . Stomach cancer Brother   . Prostate cancer Neg Hx   . Kidney cancer Neg Hx   . Bladder Cancer Neg Hx   . Breast cancer Neg Hx     Allergies  Allergen Reactions  . Nitrofuran Derivatives Diarrhea  . Codeine Sulfate Hives  . Oxycodone Itching  . Propoxyphene Hives  . Doxycycline Other (See Comments)    Headache  . Penicillins Rash  . Sulfa Antibiotics Rash     Review of Systems   Review of Systems: Negative  Unless Checked Constitutional: [] Weight loss  [] Fever  [] Chills Cardiac: [] Chest pain   []  Atrial Fibrillation  [] Palpitations   [] Shortness of breath when laying flat   [] Shortness of breath with exertion. [] Shortness of breath at rest Vascular:  [x] Pain in legs with walking   [] Pain in legs with standing [x] Pain in legs when laying flat   [] Claudication    [] Pain in feet when laying flat    [] History of DVT   [] Phlebitis   [x] Swelling in legs   [x] Varicose veins   [] Non-healing ulcers Pulmonary:   [] Uses home oxygen   [] Productive cough   [] Hemoptysis   [] Wheeze  [] COPD   [] Asthma Neurologic:  [] Dizziness   [] Seizures  [] Blackouts [] History  of stroke   [] History of TIA  [] Aphasia   [] Temporary Blindness   [] Weakness or numbness in arm   [x] Weakness or numbness in leg Musculoskeletal:   [] Joint swelling   [] Joint pain   [] Low back pain  []  History of Knee Replacement [] Arthritis [] back Surgeries  []  Spinal Stenosis    Hematologic:  [] Easy bruising  [] Easy bleeding   [] Hypercoagulable state   [] Anemic Gastrointestinal:  [] Diarrhea   [] Vomiting  [] Gastroesophageal reflux/heartburn   [] Difficulty swallowing. [] Abdominal pain Genitourinary:  [x] Chronic kidney disease   [] Difficult urination  [] Anuric   [] Blood in urine [] Frequent urination  [] Burning with urination   [] Hematuria Skin:  [] Rashes   [] Ulcers [] Wounds Psychological:  [x] History of anxiety   [x]  History of major depression  []  Memory Difficulties      OBJECTIVE:   Physical Exam  BP (!) 148/78 (BP Location: Right Arm)   Pulse 66   Resp 16   Ht 5' (1.524 m)   Wt 157 lb (71.2 kg)   BMI 30.66 kg/m   Gen: WD/WN, NAD Head: [place]/AT, No temporalis wasting.  Ear/Nose/Throat: Hearing grossly intact, nares w/o erythema or drainage Eyes: PER, EOMI, sclera nonicteric.  Neck: Supple, no masses.  No JVD.  Pulmonary:  Good air movement, no use of accessory muscles.  Cardiac: RRR Vascular:  1+ edema bilaterally Vessel Right Left  Radial  Palpable Palpable  Dorsalis Pedis Palpable Palpable  Posterior Tibial Palpable  not palpable   Gastrointestinal: soft, non-distended. No guarding/no peritoneal signs.  Musculoskeletal: M/S 5/5 throughout.  No deformity or atrophy.  Neurologic: Pain and light touch intact in extremities.  Symmetrical.  Speech is fluent. Motor exam as listed above. Psychiatric: Judgment intact, Mood & affect appropriate for pt's clinical situation. Dermatologic:  Bilateral stasis dermatitis no Ulcers Noted.  No changes consistent with cellulitis. Lymph : No Cervical lymphadenopathy, no lichenification or skin changes of chronic lymphedema.       ASSESSMENT AND PLAN:  1. PAD (peripheral artery disease) (HCC) Based upon the patient's noninvasive studies with dampened waveforms, I discussed the possibility of angiogram with the patient to allow for further ability to diagnose or treat any arterial occlusive disease.  At this time the patient does not wish to receive any invasive procedures.  Patient is advised to contact her office sooner if she begins to have any discoloration of the lower extremities, consistent coldness, or development of ulcerations.  Otherwise, the patient will follow-up in 6 months with noninvasive studies - VAS [place] ABI WITH/WO TBI; Future  2. Venous insufficiency of both lower extremities  Recommend:  The patient has large symptomatic varicose veins that are painful and associated with swelling.  I have had a long discussion with the patient regarding  varicose veins and why they cause symptoms.  Patient will begin wearing graduated compression stockings class 1 on a daily basis, beginning first thing in the morning and removing them in the evening. The patient is instructed specifically not to sleep in the stockings.    The patient  will also begin using over-the-counter analgesics such as Motrin 600 mg po TID to help control the symptoms.    In addition, behavioral modification  including elevation during the day will be initiated.    Pending the results of these changes the  patient will be reevaluated in three months.   An  ultrasound of the venous system will be obtained.   Further plans will be based on the ultrasound results and whether conservative therapies are  successful at eliminating the pain and swelling.   3. Essential (primary) hypertension Continue antihypertensive medications as already ordered, these medications have been reviewed and there are no changes at this time.    Current Outpatient Medications on File Prior to Visit  Medication Sig Dispense Refill  . acyclovir (ZOVIRAX) 200 MG capsule Take 1 capsule (200 mg total) by mouth 2 (two) times daily. 20 capsule 5  . atenolol (TENORMIN) 50 MG tablet TAKE 1 TABLET BY MOUTH EVERY DAY 90 tablet 3  . Biotin 5000 MCG CAPS Take by mouth.    . DULoxetine (CYMBALTA) 20 MG capsule TAKE 1 CAPSULE BY MOUTH EVERY DAY    . esomeprazole (NEXIUM) 20 MG capsule Take 1 capsule by mouth daily. QHS    . famotidine (PEPCID) 20 MG tablet Take 1 tablet by mouth every morning.    . folic acid (FOLVITE) 1 MG tablet Take 2 tablets by mouth daily.     . hydrochlorothiazide (HYDRODIURIL) 25 MG tablet TAKE 2 TABLETS BY MOUTH EVERY DAY (Patient taking differently: Pt takes 25 mg daily) 180 tablet 3  . meloxicam (MOBIC) 15 MG tablet TAKE 1 TABLET BY MOUTH EVERY DAY (Patient taking differently: PRN) 30 tablet 1  . methotrexate 2.5 MG tablet Take 5 tablets by mouth once a week. Pt takes 9 tablets weekly    . mirtazapine (REMERON) 30 MG tablet TAKE 1 TABLET (30 MG TOTAL) BY MOUTH AT BEDTIME. 90 tablet 1  . PREMARIN vaginal cream INSERT 1 APPLICATORFUL VAGINALLY AT BEDTIME 30 g 0  . simvastatin (ZOCOR) 20 MG tablet TAKE 1 TABLET BY MOUTH EVERY DAY 90 tablet 4  . zolpidem (AMBIEN) 5 MG tablet TAKE 1 TABLET BY MOUTH EVERYDAY AT BEDTIME (Patient taking differently: Pt takes 1/2 tab qhs) 30 tablet 5  . ciprofloxacin (CIPRO) 250 MG  tablet Take 1 tablet (250 mg total) by mouth 2 (two) times daily. (Patient not taking: Reported on 05/08/2019) 14 tablet 0  . predniSONE (DELTASONE) 5 MG tablet Take 1 tablet by mouth daily.     No current facility-administered medications on file prior to visit.     There are no Patient Instructions on file for this visit. No follow-ups on file.   Kris Hartmann, NP  This note was completed with Sales executive.  Any errors are purely unintentional.

## 2019-05-11 ENCOUNTER — Other Ambulatory Visit: Payer: Self-pay

## 2019-05-11 ENCOUNTER — Ambulatory Visit (INDEPENDENT_AMBULATORY_CARE_PROVIDER_SITE_OTHER): Payer: PPO | Admitting: Family Medicine

## 2019-05-11 DIAGNOSIS — N3946 Mixed incontinence: Secondary | ICD-10-CM | POA: Diagnosis not present

## 2019-05-11 NOTE — Progress Notes (Signed)
PTNS  Session # 10  Health & Social Factors: no change Caffeine: 1-2 Alcohol: 0 Daytime voids #per day: 5 Night-time voids #per night: 3 Urgency: Strong Incontinence Episodes #per day: 2 Ankle used: left Treatment Setting: 6 Feeling/ Response: sensory Comments: Patient tolerated well  Preformed By: Elberta Leatherwood, CMA  Follow Up: 1 week # 11

## 2019-05-13 ENCOUNTER — Other Ambulatory Visit: Payer: Self-pay

## 2019-05-13 ENCOUNTER — Ambulatory Visit (INDEPENDENT_AMBULATORY_CARE_PROVIDER_SITE_OTHER): Payer: PPO

## 2019-05-13 ENCOUNTER — Other Ambulatory Visit: Payer: Self-pay | Admitting: Physician Assistant

## 2019-05-13 VITALS — BP 101/63 | HR 64 | Temp 98.4°F

## 2019-05-13 DIAGNOSIS — N390 Urinary tract infection, site not specified: Secondary | ICD-10-CM

## 2019-05-13 LAB — MICROSCOPIC EXAMINATION: WBC, UA: >30 /hpf — AB (ref 0–5)

## 2019-05-13 LAB — URINALYSIS, COMPLETE
Bilirubin, UA: NEGATIVE
Glucose, UA: NEGATIVE
Nitrite, UA: NEGATIVE
Specific Gravity, UA: >1.03 — ABNORMAL HIGH (ref 1.005–1.030)
Urobilinogen, Ur: 0.2 mg/dL (ref 0.2–1.0)
pH, UA: 5.5 (ref 5.0–7.5)

## 2019-05-13 MED ORDER — CEPHALEXIN 500 MG PO CAPS: 500.0000 mg | ORAL_CAPSULE | Freq: Two times a day (BID) | ORAL | 0 refills | Status: AC

## 2019-05-13 NOTE — Progress Notes (Signed)
SUBJECTIVE: Caroline Conway is a 78 y.o. female who complains of urinary frequency, urgency and dysuria x 2 days, as well as leakage of urine and chills without flank pain, fever, or abnormal vaginal discharge or bleeding.    OBJECTIVE: Appears well, in no apparent distress.  Vital signs are normal.  ASSESSMENT: UTI uncomplicated without evidence of pyelonephritis per Fidela Salisbury, PA  PLAN: Treatment per orders - also push fluids, may use Pyridium OTC prn. Call or return to clinic prn if these symptoms worsen or fail to improve as anticipated. RX sent for Keflex based on U/A. Culture sent.

## 2019-05-15 ENCOUNTER — Other Ambulatory Visit: Payer: Self-pay | Admitting: Physician Assistant

## 2019-05-15 ENCOUNTER — Telehealth: Payer: Self-pay | Admitting: Physician Assistant

## 2019-05-15 LAB — CULTURE, URINE COMPREHENSIVE

## 2019-05-15 MED ORDER — CIPROFLOXACIN HCL 500 MG PO TABS: 500.0000 mg | ORAL_TABLET | Freq: Two times a day (BID) | ORAL | 0 refills | Status: AC

## 2019-05-15 NOTE — Progress Notes (Signed)
Preliminary urine culture with gram-negative rods.  She has a history of growing Cipro sensitive E. coli on prior cultures.  She is also reporting fever and chills that has not improved with 2 days of previously prescribed Keflex.  Switching her to Cipro now 500 mg twice daily x7 days given her systemic symptoms.

## 2019-05-15 NOTE — Telephone Encounter (Signed)
Patient called and stated that since she has been taking the keflex she has been having fever, chills then burning up with sweating all over. Still getting up 5 times a night to go the the bathroom. She thinks it is the [employer], [employer]

## 2019-05-21 ENCOUNTER — Ambulatory Visit (INDEPENDENT_AMBULATORY_CARE_PROVIDER_SITE_OTHER): Payer: PPO

## 2019-05-21 ENCOUNTER — Other Ambulatory Visit: Payer: Self-pay

## 2019-05-21 DIAGNOSIS — N3946 Mixed incontinence: Secondary | ICD-10-CM

## 2019-05-21 NOTE — Progress Notes (Signed)
PTNS  Session # 11  Health & Social Factors: no change Caffeine: 0-1 Alcohol: 0 Daytime voids #per day: 4-5 Night-time voids #per night: 3-4 Urgency: mild Incontinence Episodes #per day: 2  Ankle used: left Treatment Setting: 11 Feeling/ Response: sensory Comments: tolerated well   Preformed By: Gaspar Cola CMA    Assistant: Sarah    Follow Up: 1 week

## 2019-05-22 ENCOUNTER — Ambulatory Visit: Payer: PPO

## 2019-05-27 DIAGNOSIS — I10 Essential (primary) hypertension: Secondary | ICD-10-CM | POA: Diagnosis not present

## 2019-05-27 DIAGNOSIS — M0579 Rheumatoid arthritis with rheumatoid factor of multiple sites without organ or systems involvement: Secondary | ICD-10-CM | POA: Diagnosis not present

## 2019-05-27 DIAGNOSIS — M810 Age-related osteoporosis without current pathological fracture: Secondary | ICD-10-CM | POA: Diagnosis not present

## 2019-05-27 DIAGNOSIS — Z79899 Other long term (current) drug therapy: Secondary | ICD-10-CM | POA: Diagnosis not present

## 2019-05-27 DIAGNOSIS — M1812 Unilateral primary osteoarthritis of first carpometacarpal joint, left hand: Secondary | ICD-10-CM | POA: Diagnosis not present

## 2019-05-27 DIAGNOSIS — M1811 Unilateral primary osteoarthritis of first carpometacarpal joint, right hand: Secondary | ICD-10-CM | POA: Diagnosis not present

## 2019-05-27 DIAGNOSIS — E785 Hyperlipidemia, unspecified: Secondary | ICD-10-CM | POA: Diagnosis not present

## 2019-05-27 DIAGNOSIS — M159 Polyosteoarthritis, unspecified: Secondary | ICD-10-CM | POA: Diagnosis not present

## 2019-05-28 ENCOUNTER — Ambulatory Visit (INDEPENDENT_AMBULATORY_CARE_PROVIDER_SITE_OTHER): Payer: PPO | Admitting: *Deleted

## 2019-05-28 ENCOUNTER — Other Ambulatory Visit: Payer: Self-pay

## 2019-05-28 DIAGNOSIS — N3946 Mixed incontinence: Secondary | ICD-10-CM

## 2019-05-28 NOTE — Progress Notes (Signed)
PTNS  Session # 12  Health & Social Factors: No change Caffeine: 0-1 Alcohol: 0 Daytime voids #per day: 4-5 Night-time voids #per night: 1-2 Urgency: mild Incontinence Episodes #per day: 1 Ankle used: Left  Treatment Setting: 10 Feeling/ Response:Both  Performed by: Verlene Mayer, CMA  Follow Up: With provider

## 2019-05-29 ENCOUNTER — Ambulatory Visit: Payer: PPO

## 2019-06-03 DIAGNOSIS — M0579 Rheumatoid arthritis with rheumatoid factor of multiple sites without organ or systems involvement: Secondary | ICD-10-CM | POA: Diagnosis not present

## 2019-06-03 DIAGNOSIS — Z79899 Other long term (current) drug therapy: Secondary | ICD-10-CM | POA: Diagnosis not present

## 2019-06-05 ENCOUNTER — Encounter: Payer: Self-pay | Admitting: Physician Assistant

## 2019-06-05 ENCOUNTER — Other Ambulatory Visit: Payer: Self-pay

## 2019-06-05 ENCOUNTER — Ambulatory Visit (INDEPENDENT_AMBULATORY_CARE_PROVIDER_SITE_OTHER): Payer: PPO | Admitting: Physician Assistant

## 2019-06-05 VITALS — BP 119/69 | HR 69 | Ht 60.0 in | Wt 158.0 lb

## 2019-06-05 DIAGNOSIS — R3915 Urgency of urination: Secondary | ICD-10-CM

## 2019-06-05 DIAGNOSIS — N3000 Acute cystitis without hematuria: Secondary | ICD-10-CM | POA: Diagnosis not present

## 2019-06-05 DIAGNOSIS — Z87440 Personal history of urinary (tract) infections: Secondary | ICD-10-CM | POA: Diagnosis not present

## 2019-06-05 LAB — URINALYSIS, COMPLETE
Bilirubin, UA: NEGATIVE
Glucose, UA: NEGATIVE
Nitrite, UA: NEGATIVE
Specific Gravity, UA: 1.02 (ref 1.005–1.030)
Urobilinogen, Ur: 0.2 mg/dL (ref 0.2–1.0)
pH, UA: 6 (ref 5.0–7.5)

## 2019-06-05 LAB — MICROSCOPIC EXAMINATION
RBC, Urine: NONE SEEN /hpf (ref 0–2)
WBC, UA: >30 /hpf — AB (ref 0–5)

## 2019-06-05 MED ORDER — CEFDINIR 300 MG PO CAPS: 300.0000 mg | ORAL_CAPSULE | Freq: Two times a day (BID) | ORAL | 0 refills | Status: AC

## 2019-06-05 NOTE — Progress Notes (Addendum)
06/05/2019 1:58 PM   Caroline Conway 1941-03-12 [id number]  CC: Urgency, frequency, chills  HPI: Caroline Conway is a 78 y.o. female who presents today for evaluation of possible UTI. She is an established [facility] patient who last saw Caroline Conway on 12/16/2018 for annual follow-up of recurrent UTIs, vaginal atrophy, and OAB.  She completed her 12 PTNS treatment on 05/28/2019.  She reports a 2-day history of urgency, frequency, and chills. She denies dysuria, fever, nausea, vomiting, and gross hematuria.  She is concerned that her infections are not being treated adequately and wonders if a longer antibiotic course will clear her infections.  Additionally, she notes continued urinary incontinence secondary to her OAB.  She reports her PTNS did not improve her symptoms.  She has scheduled follow-up with Dr. Matilde Conway on 06/22/2019.  She does have a history of recurrent UTI, most recently on 05/15/2019 and treated with ciprofloxacin twice daily x7 days. She is not on daily prophylaxis.  She does take vaginal estrogen cream twice weekly and is on daily MiraLAX for constipation.  In-office UA today positive for 2+ blood, 1+ protein, and 2+ leukocyte esterase; urine microscopy with >30 WBCs/HPF and moderate bacteria.  She reports allergies to nitrofurantoin, doxycycline, penicillins, and sulfa antibiotics.  She has tolerated Keflex in the past.  PMH: Past Medical History:  Diagnosis Date  . Cancer ([place] [facility])    skin ca  . Collagen vascular disease (HCC)    RA  . Depression, major, single episode, in partial remission ([place]) 01/29/2015  . Dyspnea   . GERD (gastroesophageal reflux disease)   . H/O total hysterectomy   . Heartburn   . HLD (hyperlipidemia)   . Hypertension   . Neuromuscular disorder (HCC)    neuropathy in Left lower leg and foot  . Neuropathy   . Rheumatoid arteritis [facility])     Surgical History: Past Surgical History:  Procedure Laterality Date  . ANTERIOR AND POSTERIOR  VAGINAL REPAIR  04/2015  . APPENDECTOMY  1960  . BREAST CYST ASPIRATION Left    lt fna- neg  . COLONOSCOPY WITH PROPOFOL N/A 11/01/2017   Procedure: COLONOSCOPY WITH PROPOFOL;  Surgeon: Lucilla Lame, MD;  Location: [place];  Service: Endoscopy;  Laterality: N/A;  . ESOPHAGEAL DILATION  11/01/2017   Procedure: ESOPHAGEAL DILATION;  Surgeon: Lucilla Lame, MD;  Location: [place];  Service: Endoscopy;;  . ESOPHAGOGASTRODUODENOSCOPY (EGD) WITH PROPOFOL N/A 11/01/2017   Procedure: ESOPHAGOGASTRODUODENOSCOPY (EGD) WITH PROPOFOL;  Surgeon: Lucilla Lame, MD;  Location: [place];  Service: Endoscopy;  Laterality: N/A;  . LUMBAR [place] SURGERY  2011  . POLYPECTOMY  11/01/2017   Procedure: POLYPECTOMY;  Surgeon: Lucilla Lame, MD;  Location: [place];  Service: Endoscopy;;  . riight shoulder surgery Right 1999  . SHOULDER SURGERY Left 2006  . TOTAL KNEE ARTHROPLASTY Left 2011  . VAGINAL HYSTERECTOMY  04/2015    Home Medications:  Allergies as of 06/05/2019      Reactions   Nitrofuran Derivatives Diarrhea   Codeine Sulfate Hives   Oxycodone Itching   Propoxyphene Hives   Doxycycline Other (See Comments)   Headache   Penicillins Rash   Sulfa Antibiotics Rash      Medication List       Accurate as of June 05, 2019  1:58 PM. If you have any questions, ask your nurse or doctor.        acyclovir 200 MG capsule Commonly known as: ZOVIRAX Take 1 capsule (200 mg total)  by mouth 2 (two) times daily.   atenolol 50 MG tablet Commonly known as: TENORMIN TAKE 1 TABLET BY MOUTH EVERY DAY   Biotin 5000 MCG Caps Take by mouth.   cefdinir 300 MG capsule Commonly known as: OMNICEF Take 1 capsule (300 mg total) by mouth 2 (two) times daily for 7 days. Started by: Caroline Loop, PA-C   DULoxetine 20 MG capsule Commonly known as: CYMBALTA TAKE 1 CAPSULE BY MOUTH EVERY DAY   esomeprazole 20 MG capsule Commonly known as: NEXIUM Take 1 capsule  by mouth daily. QHS   famotidine 20 MG tablet Commonly known as: PEPCID Take 1 tablet by mouth every morning.   folic acid 1 MG tablet Commonly known as: FOLVITE Take 2 tablets by mouth daily.   hydrochlorothiazide 25 MG tablet Commonly known as: HYDRODIURIL TAKE 2 TABLETS BY MOUTH EVERY DAY What changed:   how much to take  how to take this  when to take this  additional instructions   meloxicam 15 MG tablet Commonly known as: MOBIC TAKE 1 TABLET BY MOUTH EVERY DAY What changed:   how much to take  how to take this  when to take this  additional instructions   methotrexate 2.5 MG tablet Take 5 tablets by mouth once a week. Pt takes 9 tablets weekly   mirtazapine 30 MG tablet Commonly known as: REMERON TAKE 1 TABLET (30 MG TOTAL) BY MOUTH AT BEDTIME.   predniSONE 5 MG tablet Commonly known as: DELTASONE Take 1 tablet by mouth daily.   Premarin vaginal cream Generic drug: conjugated estrogens INSERT 1 APPLICATORFUL VAGINALLY AT BEDTIME   simvastatin 20 MG tablet Commonly known as: ZOCOR TAKE 1 TABLET BY MOUTH EVERY DAY   zolpidem 5 MG tablet Commonly known as: AMBIEN TAKE 1 TABLET BY MOUTH EVERYDAY AT BEDTIME What changed: See the new instructions.       Allergies:  Allergies  Allergen Reactions  . Nitrofuran Derivatives Diarrhea  . Codeine Sulfate Hives  . Oxycodone Itching  . Propoxyphene Hives  . Doxycycline Other (See Comments)    Headache  . Penicillins Rash  . Sulfa Antibiotics Rash    Family History: Family History  Problem Relation Age of Onset  . Alzheimer's disease Mother   . Arthritis Mother   . Lung cancer Father   . Stomach cancer Brother   . Prostate cancer Neg Hx   . Kidney cancer Neg Hx   . Bladder Cancer Neg Hx   . Breast cancer Neg Hx     Social History:   reports that she has never smoked. She has never used smokeless tobacco. She reports that she does not drink alcohol or use drugs.  ROS: UROLOGY Frequent  Urination?: Yes Hard to postpone urination?: Yes Burning/pain with urination?: No Get up at night to urinate?: Yes Leakage of urine?: Yes Urine stream starts and stops?: No Trouble starting stream?: No Do you have to strain to urinate?: No Blood in urine?: No Urinary tract infection?: Yes Sexually transmitted disease?: No Injury to kidneys or bladder?: No Painful intercourse?: No Weak stream?: No Currently pregnant?: No Vaginal bleeding?: No Last menstrual period?: n  Gastrointestinal Nausea?: No Vomiting?: No Indigestion/heartburn?: No Diarrhea?: No Constipation?: No  Constitutional Fever: No Night sweats?: No Weight loss?: No Fatigue?: No  Skin Skin rash/lesions?: No Itching?: No  Eyes Blurred vision?: No Double vision?: No  Ears/Nose/Throat Sore throat?: No Sinus problems?: No  Hematologic/Lymphatic Swollen glands?: No Easy bruising?: No  Cardiovascular Leg swelling?: No  Chest pain?: No  Respiratory Cough?: No Shortness of breath?: No  Endocrine Excessive thirst?: No  Musculoskeletal Back pain?: No Joint pain?: No  Neurological Headaches?: No Dizziness?: No  Psychologic Depression?: No Anxiety?: No  Physical Exam: BP 119/69   Pulse 69   Ht 5' (1.524 m)   Wt 158 lb (71.7 kg)   BMI 30.86 kg/m   Constitutional:  Alert and oriented, no acute distress, nontoxic appearing HEENT: [place], AT Cardiovascular: No clubbing, cyanosis, or edema Respiratory: Normal respiratory effort, no increased work of breathing Skin: No rashes, bruises or suspicious lesions Neurologic: Grossly intact, no focal deficits, moving all 4 extremities, in wheelchair Psychiatric: Normal mood and affect  Laboratory Data: Results for orders placed or performed in visit on 06/05/19  Microscopic Examination   URINE  Result Value Ref Range   WBC, UA >30 (A) 0 - 5 /hpf   RBC None seen 0 - 2 /hpf   Epithelial Cells (non renal) 0-10 0 - 10 /hpf   Bacteria, UA Moderate  (A) None seen/Few  Urinalysis, Complete  Result Value Ref Range   Specific Gravity, UA 1.020 1.005 - 1.030   pH, UA 6.0 5.0 - 7.5   Color, UA Yellow Yellow   Appearance Ur Cloudy (A) Clear   Leukocytes,UA 2+ (A) Negative   Protein,UA 1+ (A) Negative/Trace   Glucose, UA Negative Negative   Ketones, UA Trace (A) Negative   RBC, UA 2+ (A) Negative   Bilirubin, UA Negative Negative   Urobilinogen, Ur 0.2 0.2 - 1.0 mg/dL   Nitrite, UA Negative Negative   Microscopic Examination See below:    Assessment & Plan:   1. Urinary urgency Patient presents with symptoms consistent with past UTIs and positive UA today.  Will send for culture. - Urinalysis, Complete - CULTURE, URINE COMPREHENSIVE  2. Acute cystitis without hematuria Patient reports allergy to penicillins, however she has tolerated Keflex in the past without problem.  Will prescribe cefdinir today for treatment of her infection.  I advised her that I would contact her if her urine culture necessitated a change in her therapy.  She expressed understanding. - cefdinir (OMNICEF) 300 MG capsule; Take 1 capsule (300 mg total) by mouth 2 (two) times daily for 7 days.  Dispense: 14 capsule; Refill: 0  3. History of recurrent UTI (urinary tract infection) Patient reports symptom resolution following treatment for her last infection.  I do expect her current symptoms to be consistent with a new urinary tract infection rather than persistent infection despite therapy.  I explained to the patient that I believe we are treating her infections appropriately each time she develops them.  I explained that prolonging her antibiotic course would only increase the chance of developing bacterial resistance, which would complicate future treatment of infections.  Patient is already taking vaginal estrogen cream.  She does report some constipation despite daily MiraLAX, namely the passage of hard, lumpy stool.  I advised her that she may benefit from an  increase in her dose and daily MiraLAX.  I explained that chronic constipation can play a role in recurrent urinary tract infections.  I explained that while management of this may not prevent all future infections, it may reduce her frequency of infection.  Caroline Loop, PA-C  [facility] [address] [place], [place] [address] [phone]

## 2019-06-08 LAB — CULTURE, URINE COMPREHENSIVE

## 2019-06-12 DIAGNOSIS — M159 Polyosteoarthritis, unspecified: Secondary | ICD-10-CM | POA: Diagnosis not present

## 2019-06-12 DIAGNOSIS — M0579 Rheumatoid arthritis with rheumatoid factor of multiple sites without organ or systems involvement: Secondary | ICD-10-CM | POA: Diagnosis not present

## 2019-06-12 DIAGNOSIS — E785 Hyperlipidemia, unspecified: Secondary | ICD-10-CM | POA: Diagnosis not present

## 2019-06-12 DIAGNOSIS — I10 Essential (primary) hypertension: Secondary | ICD-10-CM | POA: Diagnosis not present

## 2019-06-12 DIAGNOSIS — Z79899 Other long term (current) drug therapy: Secondary | ICD-10-CM | POA: Diagnosis not present

## 2019-06-12 DIAGNOSIS — M810 Age-related osteoporosis without current pathological fracture: Secondary | ICD-10-CM | POA: Diagnosis not present

## 2019-06-22 ENCOUNTER — Other Ambulatory Visit: Payer: Self-pay

## 2019-06-22 ENCOUNTER — Encounter: Payer: Self-pay | Admitting: Urology

## 2019-06-22 ENCOUNTER — Ambulatory Visit: Payer: PPO | Admitting: Urology

## 2019-06-22 VITALS — BP 123/77 | HR 84 | Ht 60.0 in | Wt 158.0 lb

## 2019-06-22 DIAGNOSIS — N3946 Mixed incontinence: Secondary | ICD-10-CM | POA: Diagnosis not present

## 2019-06-22 DIAGNOSIS — Z87440 Personal history of urinary (tract) infections: Secondary | ICD-10-CM

## 2019-06-22 DIAGNOSIS — R3915 Urgency of urination: Secondary | ICD-10-CM | POA: Diagnosis not present

## 2019-06-22 LAB — URINALYSIS, COMPLETE
Bilirubin, UA: NEGATIVE
Glucose, UA: NEGATIVE
Ketones, UA: NEGATIVE
Nitrite, UA: NEGATIVE
Protein,UA: NEGATIVE
Specific Gravity, UA: 1.02 (ref 1.005–1.030)
Urobilinogen, Ur: 0.2 mg/dL (ref 0.2–1.0)
pH, UA: 6.5 (ref 5.0–7.5)

## 2019-06-22 LAB — MICROSCOPIC EXAMINATION: Bacteria, UA: NONE SEEN

## 2019-06-22 MED ORDER — MIRABEGRON ER 50 MG PO TB24: 50.0000 mg | ORAL_TABLET | Freq: Every day | ORAL | 11 refills | Status: AC

## 2019-06-22 MED ORDER — CIPROFLOXACIN HCL 250 MG PO TABS: 250.0000 mg | ORAL_TABLET | Freq: Two times a day (BID) | ORAL | 0 refills | Status: AC

## 2019-06-22 MED ORDER — CEPHALEXIN 250 MG PO CAPS: 250.0000 mg | ORAL_CAPSULE | Freq: Every day | ORAL | 11 refills | Status: AC

## 2019-06-22 NOTE — Progress Notes (Signed)
06/22/2019 11:09 AM   Caroline Conway [birth year] PH:[id number]  Referring provider: Glean Hess, [place] [address],  [place] [address]  No chief complaint on file.   HPI: 2019:  The patient has urge incontinence worse than her stress incontinence and rare bedwetting. She had failed oxybutynin and Vesicare in the past  She wears 3 pads a day and many of them are damp and her baseline was 6 pads per day with more severe leakage I did not think the dry eye was due to the medication.   The patient describes two breakthrough infections and she self treat her self once with Keflex.  She does say the beta 3 agonist reduces her incontinence but also her frequency and urgency. She paced $40 a month but soon will go into a very expensive donut hole until the end of the year  She understands that there is not a generic beta 3 agonist and she has failed 2 other antimuscarinics. 2 months the samples with a repeat appointment before the end of the year was discussed  The patient had a breakthrough on Macrodantin and it caused loose bowel movements. She does better on the beta 3 agonist. She currently went back on trimethoprim and we decided to stay on it even know it is not been perfect. She understands a daily Keflex as an option and she's had Keflex short-term in the past. We decided not to use it yet. She is somewhat frustrated and I can empathize with her. Again pathophysiology was discussed 2 more times. Treatment tools were discussed. I did not prescribe local estrogen cream. See her in 6 months.Repeated twice that the beta 3 agonist does not cause infection  She has been off her medication for 1 month due to prescription issues.  Frequency improved.  No longer taking Myrbetriq due to expense and began PTNS  Today Patient once again has been having a lot of bladder infections.  Percutaneous tibial nerve stimulation did not help her urge incontinence.  She has more  incontinence and increasing nocturia x4 when she is infected.  The urine is cloudy.  She has been on and off antibiotics a number of times recently treated by nurse practitioner.  She self put herself on Keflex over the weekend which could influence the culture results  She did have pansensitive E. coli September 2 and September 25 in the medical record as well as in July.  Modifying factors: There are no other modifying factors  Associated signs and symptoms: There are no other associated signs and symptoms Aggravating and relieving factors: There are no other aggravating or relieving factors Severity: Moderate Duration: Persistent    PMH: Past Medical History:  Diagnosis Date  . Cancer ([place])    skin ca  . Collagen vascular disease (HCC)    RA  . Depression, major, single episode, in partial remission ([place]) 01/29/2015  . Dyspnea   . GERD (gastroesophageal reflux disease)   . H/O total hysterectomy   . Heartburn   . HLD (hyperlipidemia)   . Hypertension   . Neuromuscular disorder (HCC)    neuropathy in Left lower leg and foot  . Neuropathy   . Rheumatoid arteritis [facility])     Surgical History: Past Surgical History:  Procedure Laterality Date  . ANTERIOR AND POSTERIOR VAGINAL REPAIR  04/2015  . APPENDECTOMY  1960  . BREAST CYST ASPIRATION Left    lt fna- neg  . COLONOSCOPY WITH PROPOFOL N/A 11/01/2017  Procedure: COLONOSCOPY WITH PROPOFOL;  Surgeon: Lucilla Lame, MD;  Location: [place];  Service: Endoscopy;  Laterality: N/A;  . ESOPHAGEAL DILATION  11/01/2017   Procedure: ESOPHAGEAL DILATION;  Surgeon: Lucilla Lame, MD;  Location: [place];  Service: Endoscopy;;  . ESOPHAGOGASTRODUODENOSCOPY (EGD) WITH PROPOFOL N/A 11/01/2017   Procedure: ESOPHAGOGASTRODUODENOSCOPY (EGD) WITH PROPOFOL;  Surgeon: Lucilla Lame, MD;  Location: [place];  Service: Endoscopy;  Laterality: N/A;  . LUMBAR [place] SURGERY  2011  . POLYPECTOMY  11/01/2017   Procedure:  POLYPECTOMY;  Surgeon: Lucilla Lame, MD;  Location: [place];  Service: Endoscopy;;  . riight shoulder surgery Right 1999  . SHOULDER SURGERY Left 2006  . TOTAL KNEE ARTHROPLASTY Left 2011  . VAGINAL HYSTERECTOMY  04/2015    Home Medications:  Allergies as of 06/22/2019      Reactions   Nitrofuran Derivatives Diarrhea   Codeine Sulfate Hives   Oxycodone Itching   Propoxyphene Hives   Doxycycline Other (See Comments)   Headache   Penicillins Rash   Sulfa Antibiotics Rash      Medication List       Accurate as of June 22, 2019 11:09 AM. If you have any questions, ask your nurse or doctor.        acyclovir 200 MG capsule Commonly known as: ZOVIRAX Take 1 capsule (200 mg total) by mouth 2 (two) times daily.   atenolol 50 MG tablet Commonly known as: TENORMIN TAKE 1 TABLET BY MOUTH EVERY DAY   Biotin 5000 MCG Caps Take by mouth.   DULoxetine 20 MG capsule Commonly known as: CYMBALTA TAKE 1 CAPSULE BY MOUTH EVERY DAY   esomeprazole 20 MG capsule Commonly known as: NEXIUM Take 1 capsule by mouth daily. QHS   famotidine 20 MG tablet Commonly known as: PEPCID Take 1 tablet by mouth every morning.   folic acid 1 MG tablet Commonly known as: FOLVITE Take 2 tablets by mouth daily.   hydrochlorothiazide 25 MG tablet Commonly known as: HYDRODIURIL TAKE 2 TABLETS BY MOUTH EVERY DAY What changed:   how much to take  how to take this  when to take this  additional instructions   meloxicam 15 MG tablet Commonly known as: MOBIC TAKE 1 TABLET BY MOUTH EVERY DAY What changed:   how much to take  how to take this  when to take this  additional instructions   methotrexate 2.5 MG tablet Take 5 tablets by mouth once a week. Pt takes 9 tablets weekly   mirtazapine 30 MG tablet Commonly known as: REMERON TAKE 1 TABLET (30 MG TOTAL) BY MOUTH AT BEDTIME.   predniSONE 5 MG tablet Commonly known as: DELTASONE Take 1 tablet by mouth daily.    Premarin vaginal cream Generic drug: conjugated estrogens INSERT 1 APPLICATORFUL VAGINALLY AT BEDTIME   simvastatin 20 MG tablet Commonly known as: ZOCOR TAKE 1 TABLET BY MOUTH EVERY DAY   zolpidem 5 MG tablet Commonly known as: AMBIEN TAKE 1 TABLET BY MOUTH EVERYDAY AT BEDTIME What changed: See the new instructions.       Allergies:  Allergies  Allergen Reactions  . Nitrofuran Derivatives Diarrhea  . Codeine Sulfate Hives  . Oxycodone Itching  . Propoxyphene Hives  . Doxycycline Other (See Comments)    Headache  . Penicillins Rash  . Sulfa Antibiotics Rash    Family History: Family History  Problem Relation Age of Onset  . Alzheimer's disease Mother   . Arthritis Mother   . Lung cancer  Father   . Stomach cancer Brother   . Prostate cancer Neg Hx   . Kidney cancer Neg Hx   . Bladder Cancer Neg Hx   . Breast cancer Neg Hx     Social History:  reports that she has never smoked. She has never used smokeless tobacco. She reports that she does not drink alcohol or use drugs.  ROS:                                        Physical Exam: There were no vitals taken for this visit.  Constitutional:  Alert and oriented, No acute distress.  Laboratory Data: Lab Results  Component Value Date   WBC 6.2 01/23/2019   HGB 13.1 01/23/2019   HCT 37.7 01/23/2019   MCV 93 01/23/2019   PLT 281 01/23/2019    Lab Results  Component Value Date   CREATININE 1.08 (H) 01/23/2019    No results found for: PSA  No results found for: TESTOSTERONE  No results found for: HGBA1C  Urinalysis    Component Value Date/Time   COLORURINE YELLOW 02/21/2018 1219   APPEARANCEUR Cloudy (A) 06/05/2019 1135   LABSPEC 1.020 02/21/2018 1219   LABSPEC 1.015 03/01/2013 1325   PHURINE 7.0 02/21/2018 1219   GLUCOSEU Negative 06/05/2019 1135   GLUCOSEU NEGATIVE 03/01/2013 1325   HGBUR SMALL (A) 02/21/2018 1219   BILIRUBINUR Negative 06/05/2019 1135   BILIRUBINUR  NEGATIVE 03/01/2013 1325   KETONESUR NEGATIVE 02/21/2018 1219   PROTEINUR 1+ (A) 06/05/2019 1135   PROTEINUR NEGATIVE 02/21/2018 1219   UROBILINOGEN 0.2 02/09/2019 1221   NITRITE Negative 06/05/2019 1135   NITRITE NEGATIVE 02/21/2018 1219   LEUKOCYTESUR 2+ (A) 06/05/2019 1135   LEUKOCYTESUR 3+ 03/01/2013 1325    Pertinent Imaging:   Assessment & Plan: Patient has recurrent bladder infections on a frequent basis.  She had a normal CT scan in 2017 I do not think she needs this repeated.  The role of treating this infection and putting her on daily Keflex was described.  She tends to be discouraged about her presentation but does certainly have a lot of bladder troubles.  She thinks that 7 days is not enough antibiotic.  Patient describes breakthrough infections on trimethoprim  Patient was called in ciprofloxacin 250 mg twice a day for 1 week.  She will then start daily Keflex 250 mg a day.  The culture might be negative because of the Keflex over the weekend.  She would like to go back on the Myrbetriq and she thinks she can afford the $40.  Follow-up with nurse practitioner in 8 weeks.  We did not have any other prophylactic regimes for her    There are no diagnoses linked to this encounter.  No follow-ups on file.  Reece Packer, MD  [facility] [address] [place], [place] [address] [phone]

## 2019-06-24 LAB — CULTURE, URINE COMPREHENSIVE

## 2019-06-25 DIAGNOSIS — M810 Age-related osteoporosis without current pathological fracture: Secondary | ICD-10-CM | POA: Diagnosis not present

## 2019-07-27 ENCOUNTER — Other Ambulatory Visit: Payer: Self-pay

## 2019-07-27 ENCOUNTER — Ambulatory Visit (INDEPENDENT_AMBULATORY_CARE_PROVIDER_SITE_OTHER): Payer: PPO | Admitting: Internal Medicine

## 2019-07-27 ENCOUNTER — Encounter: Payer: Self-pay | Admitting: Internal Medicine

## 2019-07-27 VITALS — BP 104/62 | HR 66 | Ht 60.0 in | Wt 157.0 lb

## 2019-07-27 DIAGNOSIS — I10 Essential (primary) hypertension: Secondary | ICD-10-CM

## 2019-07-27 DIAGNOSIS — F5101 Primary insomnia: Secondary | ICD-10-CM

## 2019-07-27 DIAGNOSIS — M069 Rheumatoid arthritis, unspecified: Secondary | ICD-10-CM | POA: Diagnosis not present

## 2019-07-27 MED ORDER — MELOXICAM 15 MG PO TABS: 15.0000 mg | ORAL_TABLET | Freq: Every day | ORAL | 1 refills | Status: AC

## 2019-07-27 NOTE — Progress Notes (Signed)
Date:  07/27/2019   Name:  Caroline Conway   DOB:  04-21-1941   MRN:  [id number]   Chief Complaint: Hypertension (Recheck kidney function. )  Hypertension This is a chronic problem. The problem is controlled. Pertinent negatives include no chest pain, headaches, palpitations or shortness of breath. Past treatments include diuretics and beta blockers. The current treatment provides significant improvement. There are no compliance problems.   Insomnia Primary symptoms: sleep disturbance.  The problem occurs nightly. Past treatments include medication (remeron 15 mg - has vivid dreams and sweats).  Joint pain - she is on medication from Rheumatology - MTX and folate.  She uses Mobic intermittently when she is more active.  Lab Results  Component Value Date   CREATININE 1.08 (H) 01/23/2019   BUN 21 01/23/2019   NA 137 01/23/2019   K 4.3 01/23/2019   CL 99 01/23/2019   CO2 25 01/23/2019   Lab Results  Component Value Date   CHOL 186 01/23/2019   HDL 77 01/23/2019   LDLCALC 90 01/23/2019   TRIG 96 01/23/2019   CHOLHDL 2.4 01/23/2019   Lab Results  Component Value Date   TSH 1.740 01/23/2019   No results found for: HGBA1C   Review of Systems  Constitutional: Negative for appetite change, fatigue, fever and unexpected weight change.  HENT: Negative for tinnitus and trouble swallowing.   Eyes: Negative for visual disturbance.  Respiratory: Negative for cough, chest tightness and shortness of breath.   Cardiovascular: Negative for chest pain, palpitations and leg swelling.  Gastrointestinal: Negative for abdominal pain.  Genitourinary: Positive for difficulty urinating, frequency and urgency. Negative for dysuria and hematuria.  Musculoskeletal: Negative for arthralgias.  Neurological: Negative for tremors, numbness and headaches.  Psychiatric/Behavioral: Positive for sleep disturbance. Negative for dysphoric mood. The patient has insomnia. The patient is not  nervous/anxious.     Patient Active Problem List   Diagnosis Date Noted  . PAD (peripheral artery disease) ([place]) 05/10/2019  . Pain in limb 05/01/2019  . Chronic bronchitis ([place]) 01/23/2019  . Bunion of great toe of right foot 09/15/2018  . Current moderate episode of major depressive disorder without prior episode ([place]) 03/25/2018  . Abnormal feces   . Benign neoplasm of transverse colon   . Esophageal dysphagia   . Stricture and stenosis of esophagus   . Abnormal CXR 03/21/2017  . Chronic renal insufficiency, stage 3 (moderate) 03/19/2017  . Venous insufficiency of both lower extremities 02/13/2017  . Herpes simplex infection 07/07/2015  . Anxiety disorder 01/29/2015  . Atrophic vaginitis 01/29/2015  . Dyslipidemia 01/29/2015  . Essential (primary) hypertension 01/29/2015  . Acid reflux 01/29/2015  . Idiopathic peripheral neuropathy 01/29/2015  . Idiopathic insomnia 01/29/2015  . Rheumatoid arthritis involving multiple joints ([place]) 01/29/2015  . Urge incontinence 01/29/2015  . Pelvic relaxation due to vaginal prolapse 01/29/2015    Allergies  Allergen Reactions  . Nitrofuran Derivatives Diarrhea  . Codeine Sulfate Hives  . Oxycodone Itching  . Propoxyphene Hives  . Doxycycline Other (See Comments)    Headache  . Penicillins Rash  . Sulfa Antibiotics Rash    Past Surgical History:  Procedure Laterality Date  . ANTERIOR AND POSTERIOR VAGINAL REPAIR  04/2015  . APPENDECTOMY  1960  . BREAST CYST ASPIRATION Left    lt fna- neg  . COLONOSCOPY WITH PROPOFOL N/A 11/01/2017   Procedure: COLONOSCOPY WITH PROPOFOL;  Surgeon: Lucilla Lame, MD;  Location: [facility];  Service: Endoscopy;  Laterality: N/A;  .  ESOPHAGEAL DILATION  11/01/2017   Procedure: ESOPHAGEAL DILATION;  Surgeon: Lucilla Lame, MD;  Location: [facility];  Service: Endoscopy;;  . ESOPHAGOGASTRODUODENOSCOPY (EGD) WITH PROPOFOL N/A 11/01/2017   Procedure: ESOPHAGOGASTRODUODENOSCOPY (EGD) WITH  PROPOFOL;  Surgeon: Lucilla Lame, MD;  Location: [facility];  Service: Endoscopy;  Laterality: N/A;  . LUMBAR [place] SURGERY  2011  . POLYPECTOMY  11/01/2017   Procedure: POLYPECTOMY;  Surgeon: Lucilla Lame, MD;  Location: [facility];  Service: Endoscopy;;  . riight shoulder surgery Right 1999  . SHOULDER SURGERY Left 2006  . TOTAL KNEE ARTHROPLASTY Left 2011  . VAGINAL HYSTERECTOMY  04/2015    Social History   Tobacco Use  . Smoking status: Never Smoker  . Smokeless tobacco: Never Used  . Tobacco comment: smoking cessation materials not required  Substance Use Topics  . Alcohol use: No    Alcohol/week: 0.0 standard drinks  . Drug use: No     Medication list has been reviewed and updated.  Current Meds  Medication Sig  . acyclovir (ZOVIRAX) 200 MG capsule Take 1 capsule (200 mg total) by mouth 2 (two) times daily.  Marland Kitchen atenolol (TENORMIN) 50 MG tablet TAKE 1 TABLET BY MOUTH EVERY DAY  . Biotin 5000 MCG CAPS Take by mouth.  . calcium carbonate (OS-CAL) 1250 (500 Ca) MG chewable tablet Chew 1 tablet by mouth daily.  . cephALEXin (KEFLEX) 250 MG capsule Take by mouth daily.  Marland Kitchen denosumab (PROLIA) 60 MG/ML SOSY injection Inject 60 mg into the skin every 6 (six) months.  . DULoxetine (CYMBALTA) 20 MG capsule TAKE 1 CAPSULE BY MOUTH EVERY DAY  . esomeprazole (NEXIUM) 20 MG capsule Take 1 capsule by mouth daily. QHS  . folic acid (FOLVITE) 1 MG tablet Take 2 tablets by mouth daily.   . hydrochlorothiazide (HYDRODIURIL) 25 MG tablet TAKE 2 TABLETS BY MOUTH EVERY DAY (Patient taking differently: Take 25 mg by mouth daily. Pt takes 25 mg daily)  . meloxicam (MOBIC) 15 MG tablet Take 1 tablet (15 mg total) by mouth daily. PRN  . methotrexate 2.5 MG tablet Take 9 tablets by mouth once a week.   . mirabegron ER (MYRBETRIQ) 50 MG TB24 tablet Take 1 tablet (50 mg total) by mouth daily.  . mirtazapine (REMERON) 30 MG tablet TAKE 1 TABLET (30 MG TOTAL) BY MOUTH AT BEDTIME.  Marland Kitchen  PREMARIN vaginal cream INSERT 1 APPLICATORFUL VAGINALLY AT BEDTIME  . simvastatin (ZOCOR) 20 MG tablet TAKE 1 TABLET BY MOUTH EVERY DAY  . zolpidem (AMBIEN) 5 MG tablet TAKE 1 TABLET BY MOUTH EVERYDAY AT BEDTIME (Patient taking differently: Pt takes 1/2 tab qhs)  . [DISCONTINUED] meloxicam (MOBIC) 15 MG tablet TAKE 1 TABLET BY MOUTH EVERY DAY (Patient taking differently: PRN)    PHQ 2/9 Scores 07/27/2019 04/08/2019 02/09/2019 01/23/2019  PHQ - 2 Score 0 0 0 2  PHQ- 9 Score 6 - 0 11    BP Readings from Last 3 Encounters:  07/27/19 104/62  06/22/19 123/77  06/05/19 119/69    Physical Exam Constitutional:      Appearance: Normal appearance.  Neck:     Musculoskeletal: Normal range of motion.  Cardiovascular:     Rate and Rhythm: Normal rate and regular rhythm.     Pulses: Normal pulses.     Heart sounds: No murmur.  Pulmonary:     Effort: Pulmonary effort is normal.     Breath sounds: No wheezing or rhonchi.  Musculoskeletal:     Right lower leg:  Edema (trace edema) present.     Left lower leg: Edema (mild edema) present.  Lymphadenopathy:     Cervical: No cervical adenopathy.  Skin:    General: Skin is warm.     Capillary Refill: Capillary refill takes less than 2 seconds.  Neurological:     General: No focal deficit present.     Mental Status: She is alert.  Psychiatric:        Mood and Affect: Mood normal.        Behavior: Behavior normal.     Wt Readings from Last 3 Encounters:  07/27/19 157 lb (71.2 kg)  06/22/19 158 lb (71.7 kg)  06/05/19 158 lb (71.7 kg)    BP 104/62   Pulse 66   Ht 5' (1.524 m)   Wt 157 lb (71.2 kg)   SpO2 95%   BMI 30.66 kg/m   Assessment and Plan: 1. Essential (primary) hypertension Clinically stable exam with well controlled BP.   Tolerating medications, atenolol 50 mg and hctz 25 mg, without side effects at this time. Pt to continue current regimen and low sodium diet; benefits of regular exercise as able discussed. - Basic  metabolic panel  2. Rheumatoid arthritis involving multiple joints (HCC) Continue follow up with Rheumatology Continue current regimen of MTX and Folate Refill meloxicam to use PRN only for more severe joint pain - meloxicam (MOBIC) 15 MG tablet; Take 1 tablet (15 mg total) by mouth daily. PRN  Dispense: 30 tablet; Refill: 1  3. Idiopathic insomnia Taking Ambien as needed 2.5 mg qhs Also Remeron 15 mg but having vivid dreams - recommend trying 1/2 tab nightly instead and call for refill on the lower dose when needed   Partially dictated using Editor, commissioning. Any errors are unintentional.  Halina Maidens, MD [facility]  07/27/2019

## 2019-07-28 ENCOUNTER — Other Ambulatory Visit: Payer: Self-pay | Admitting: Internal Medicine

## 2019-07-28 DIAGNOSIS — I10 Essential (primary) hypertension: Secondary | ICD-10-CM

## 2019-07-28 LAB — BASIC METABOLIC PANEL
BUN/Creatinine Ratio: 26 (ref 12–28)
BUN: 28 mg/dL — ABNORMAL HIGH (ref 8–27)
CO2: 22 mmol/L (ref 20–29)
Calcium: 9.4 mg/dL (ref 8.7–10.3)
Chloride: 102 mmol/L (ref 96–106)
Creatinine, Ser: 1.07 mg/dL — ABNORMAL HIGH (ref 0.57–1.00)
GFR calc Af Amer: 57 mL/min/{1.73_m2} — ABNORMAL LOW (ref >59)
GFR calc non Af Amer: 50 mL/min/{1.73_m2} — ABNORMAL LOW (ref >59)
Glucose: 83 mg/dL (ref 65–99)
Potassium: 4.6 mmol/L (ref 3.5–5.2)
Sodium: 140 mmol/L (ref 134–144)

## 2019-08-17 ENCOUNTER — Other Ambulatory Visit: Payer: Self-pay

## 2019-08-17 ENCOUNTER — Encounter: Payer: Self-pay | Admitting: Physician Assistant

## 2019-08-17 ENCOUNTER — Ambulatory Visit (INDEPENDENT_AMBULATORY_CARE_PROVIDER_SITE_OTHER): Payer: PPO | Admitting: Physician Assistant

## 2019-08-17 VITALS — BP 121/80 | HR 61 | Ht 60.0 in | Wt 157.0 lb

## 2019-08-17 DIAGNOSIS — N3281 Overactive bladder: Secondary | ICD-10-CM | POA: Diagnosis not present

## 2019-08-17 DIAGNOSIS — L578 Other skin changes due to chronic exposure to nonionizing radiation: Secondary | ICD-10-CM | POA: Diagnosis not present

## 2019-08-17 DIAGNOSIS — Z872 Personal history of diseases of the skin and subcutaneous tissue: Secondary | ICD-10-CM | POA: Diagnosis not present

## 2019-08-17 DIAGNOSIS — Z86018 Personal history of other benign neoplasm: Secondary | ICD-10-CM | POA: Diagnosis not present

## 2019-08-17 DIAGNOSIS — Z87440 Personal history of urinary (tract) infections: Secondary | ICD-10-CM

## 2019-08-17 LAB — MICROSCOPIC EXAMINATION

## 2019-08-17 LAB — URINALYSIS, COMPLETE
Bilirubin, UA: NEGATIVE
Glucose, UA: NEGATIVE
Ketones, UA: NEGATIVE
Leukocytes,UA: NEGATIVE
Nitrite, UA: NEGATIVE
Protein,UA: NEGATIVE
Specific Gravity, UA: 1.02 (ref 1.005–1.030)
Urobilinogen, Ur: 0.2 mg/dL (ref 0.2–1.0)
pH, UA: 7 (ref 5.0–7.5)

## 2019-08-17 NOTE — Progress Notes (Signed)
08/17/2019 3:16 PM   [name] 1941/01/08 [id number]  CC: Follow-up of recurrent UTIs, OAB  HPI: Caroline Conway is a 78 y.o. female who presents today for follow-up of recurrent UTIs on Keflex prophylaxis and OAB on Myrbetriq. She is an established [facility] patient who last saw Dr. Matilde Sprang on 06/22/2019 for the same problems.  She was started at that visit on Keflex 250 mg daily for UTI prophylaxis and restarted on Myrbetriq 50 mg.  Today, she reports feeling well.  She states she is "improving every day."  She has no acute concerns today.  In-office UA today positive for trace-intact blood; urine microscopy with hyaline casts.  PMH: Past Medical History:  Diagnosis Date  . Cancer ([place])    skin ca  . Collagen vascular disease (HCC)    RA  . Depression, major, single episode, in partial remission ([place]) 01/29/2015  . Dyspnea   . GERD (gastroesophageal reflux disease)   . H/O total hysterectomy   . Heartburn   . HLD (hyperlipidemia)   . Hypertension   . Neuromuscular disorder (HCC)    neuropathy in Left lower leg and foot  . Neuropathy   . Rheumatoid arteritis [facility])     Surgical History: Past Surgical History:  Procedure Laterality Date  . ANTERIOR AND POSTERIOR VAGINAL REPAIR  04/2015  . APPENDECTOMY  1960  . BREAST CYST ASPIRATION Left    lt fna- neg  . COLONOSCOPY WITH PROPOFOL N/A 11/01/2017   Procedure: COLONOSCOPY WITH PROPOFOL;  Surgeon: Lucilla Lame, MD;  Location: [place];  Service: Endoscopy;  Laterality: N/A;  . ESOPHAGEAL DILATION  11/01/2017   Procedure: ESOPHAGEAL DILATION;  Surgeon: Lucilla Lame, MD;  Location: [place];  Service: Endoscopy;;  . ESOPHAGOGASTRODUODENOSCOPY (EGD) WITH PROPOFOL N/A 11/01/2017   Procedure: ESOPHAGOGASTRODUODENOSCOPY (EGD) WITH PROPOFOL;  Surgeon: Lucilla Lame, MD;  Location: [place];  Service: Endoscopy;  Laterality: N/A;  . LUMBAR [place] SURGERY  2011  . POLYPECTOMY  11/01/2017   Procedure: POLYPECTOMY;  Surgeon: Lucilla Lame, MD;  Location: [place];  Service: Endoscopy;;  . riight shoulder surgery Right 1999  . SHOULDER SURGERY Left 2006  . TOTAL KNEE ARTHROPLASTY Left 2011  . VAGINAL HYSTERECTOMY  04/2015    Home Medications:  Allergies as of 08/17/2019      Reactions   Nitrofuran Derivatives Diarrhea   Codeine Sulfate Hives   Oxycodone Itching   Propoxyphene Hives   Doxycycline Other (See Comments)   Headache   Penicillins Rash   Sulfa Antibiotics Rash      Medication List       Accurate as of August 17, 2019  3:16 PM. If you have any questions, ask your nurse or doctor.        acyclovir 200 MG capsule Commonly known as: ZOVIRAX Take 1 capsule (200 mg total) by mouth 2 (two) times daily.   atenolol 50 MG tablet Commonly known as: TENORMIN TAKE 1 TABLET BY MOUTH EVERY DAY   Biotin 5000 MCG Caps Take by mouth.   calcium carbonate 1250 (500 Ca) MG chewable tablet Commonly known as: OS-CAL Chew 1 tablet by mouth daily.   cephALEXin 250 MG capsule Commonly known as: KEFLEX Take by mouth daily.   denosumab 60 MG/ML Sosy injection Commonly known as: PROLIA Inject 60 mg into the skin every 6 (six) months.   DULoxetine 20 MG capsule Commonly known as: CYMBALTA TAKE 1 CAPSULE BY MOUTH EVERY DAY   esomeprazole 20 MG capsule  Commonly known as: NEXIUM Take 1 capsule by mouth daily. QHS   Fluzone High-Dose Quadrivalent 0.7 ML Susy Generic drug: Influenza Vac High-Dose Quad   folic acid 1 MG tablet Commonly known as: FOLVITE Take 2 tablets by mouth daily.   hydrochlorothiazide 25 MG tablet Commonly known as: HYDRODIURIL Take 1 tablet (25 mg total) by mouth daily. Pt takes 25 mg daily   meloxicam 15 MG tablet Commonly known as: MOBIC Take 1 tablet (15 mg total) by mouth daily. PRN   methotrexate 2.5 MG tablet Take 9 tablets by mouth once a week.   mirabegron ER 50 MG Tb24 tablet Commonly known as: MYRBETRIQ Take 1  tablet (50 mg total) by mouth daily.   mirtazapine 30 MG tablet Commonly known as: REMERON TAKE 1 TABLET (30 MG TOTAL) BY MOUTH AT BEDTIME.   Premarin vaginal cream Generic drug: conjugated estrogens INSERT 1 APPLICATORFUL VAGINALLY AT BEDTIME   simvastatin 20 MG tablet Commonly known as: ZOCOR TAKE 1 TABLET BY MOUTH EVERY DAY   zolpidem 5 MG tablet Commonly known as: AMBIEN TAKE 1 TABLET BY MOUTH EVERYDAY AT BEDTIME What changed: See the new instructions.      Allergies:  Allergies  Allergen Reactions  . Nitrofuran Derivatives Diarrhea  . Codeine Sulfate Hives  . Oxycodone Itching  . Propoxyphene Hives  . Doxycycline Other (See Comments)    Headache  . Penicillins Rash  . Sulfa Antibiotics Rash    Family History: Family History  Problem Relation Age of Onset  . Alzheimer's disease Mother   . Arthritis Mother   . Lung cancer Father   . Stomach cancer Brother   . Prostate cancer Neg Hx   . Kidney cancer Neg Hx   . Bladder Cancer Neg Hx   . Breast cancer Neg Hx     Social History:   reports that she has never smoked. She has never used smokeless tobacco. She reports that she does not drink alcohol or use drugs.  ROS: UROLOGY Frequent Urination?: No Hard to postpone urination?: No Burning/pain with urination?: No Get up at night to urinate?: No Leakage of urine?: No Urine stream starts and stops?: No Trouble starting stream?: No Do you have to strain to urinate?: No Blood in urine?: No Urinary tract infection?: No Sexually transmitted disease?: No Injury to kidneys or bladder?: No Painful intercourse?: No Weak stream?: No Currently pregnant?: No Vaginal bleeding?: No Last menstrual period?: N/A  Gastrointestinal Nausea?: No Vomiting?: No Indigestion/heartburn?: No Diarrhea?: No Constipation?: No  Constitutional Fever: No Night sweats?: No Weight loss?: No Fatigue?: No  Skin Skin rash/lesions?: No Itching?: No  Eyes Blurred vision?:  No Double vision?: No  Ears/Nose/Throat Sore throat?: No Sinus problems?: No  Hematologic/Lymphatic Swollen glands?: No Easy bruising?: No  Cardiovascular Leg swelling?: No Chest pain?: No  Respiratory Cough?: No Shortness of breath?: No  Endocrine Excessive thirst?: No  Musculoskeletal Back pain?: No Joint pain?: No  Neurological Headaches?: No Dizziness?: No  Psychologic Depression?: No Anxiety?: No  Physical Exam: BP 121/80 (BP Location: Left Arm, Patient Position: Sitting, Cuff Size: Normal)   Pulse 61   Ht 5' (1.524 m)   Wt 157 lb (71.2 kg)   BMI 30.66 kg/m   Constitutional:  Alert and oriented, no acute distress, nontoxic appearing HEENT: [place], AT Cardiovascular: No clubbing, cyanosis, or edema Respiratory: Normal respiratory effort, no increased work of breathing Skin: No rashes, bruises or suspicious lesions Neurologic: Grossly intact, no focal deficits, moving all 4 extremities Psychiatric:  Normal mood and affect  Laboratory Data: Results for orders placed or performed in visit on 08/17/19  Microscopic Examination   URINE  Result Value Ref Range   WBC, UA 0-5 0 - 5 /hpf   RBC 0-2 0 - 2 /hpf   Epithelial Cells (non renal) 0-10 0 - 10 /hpf   Renal Epithel, UA 0-10 (A) None seen /hpf   Casts Present (A) None seen /lpf   Cast Type Hyaline casts N/A   Bacteria, UA Few None seen/Few  Urinalysis, Complete  Result Value Ref Range   Specific Gravity, UA 1.020 1.005 - 1.030   pH, UA 7.0 5.0 - 7.5   Color, UA Yellow Yellow   Appearance Ur Hazy (A) Clear   Leukocytes,UA Negative Negative   Protein,UA Negative Negative/Trace   Glucose, UA Negative Negative   Ketones, UA Negative Negative   RBC, UA Trace (A) Negative   Bilirubin, UA Negative Negative   Urobilinogen, Ur 0.2 0.2 - 1.0 mg/dL   Nitrite, UA Negative Negative   Microscopic Examination See below:    Assessment & Plan:   1. History of recurrent UTI (urinary tract infection) No  irritative symptoms today.  UA reassuring for infection, will not send for culture.  Tolerating Keflex prophylaxis well.  Recommend continuing this.  No changes today.  She does not need refills. - Urinalysis, Complete  2. Overactive bladder Tolerating Myrbetriq well, states her symptoms are improving on this med.  Recommend continuing this.  No changes today.  She does not need refills.  Return in about 6 months (around 02/15/2020) for OAB, rUTI follow-up with Dr. Matilde Sprang.  Debroah Loop, PA-C  [facility] [address] [place], [place] [address] [phone]

## 2019-08-24 ENCOUNTER — Other Ambulatory Visit: Payer: Self-pay

## 2019-08-24 ENCOUNTER — Telehealth: Payer: Self-pay | Admitting: Internal Medicine

## 2019-08-24 ENCOUNTER — Ambulatory Visit
Admission: EM | Admit: 2019-08-24 | Discharge: 2019-08-24 | Disposition: A | Discharge status: Home / Self Care | Payer: PPO | Attending: Family Medicine | Admitting: Family Medicine

## 2019-08-24 DIAGNOSIS — R05 Cough: Secondary | ICD-10-CM

## 2019-08-24 DIAGNOSIS — J069 Acute upper respiratory infection, unspecified: Secondary | ICD-10-CM

## 2019-08-24 DIAGNOSIS — M791 Myalgia, unspecified site: Secondary | ICD-10-CM

## 2019-08-24 LAB — RAPID INFLUENZA A&B ANTIGENS
Influenza A (ARMC): NEGATIVE
Influenza B (ARMC): NEGATIVE

## 2019-08-24 NOTE — Telephone Encounter (Signed)
Dr Army Melia informed.   Thank you!

## 2019-08-24 NOTE — Discharge Instructions (Signed)
Rest, fluids, over the counter medications, tylenol as needed

## 2019-08-24 NOTE — Telephone Encounter (Signed)
Pt is having high fever, sore throat, cough and chills since last Tuesday so want to be seen by dr. B to get tested for covid. Per CMA I advised to go to UC since we don't do testing here and cannot see sick pts in person per [facility]. Pt ok with going to UC.

## 2019-08-24 NOTE — ED Triage Notes (Signed)
Pt states "I've been sick for a week". Coughing, fever, body aches/chills.

## 2019-08-24 NOTE — ED Provider Notes (Signed)
[facility]    CSN: [id number] Arrival date & time: 08/24/19  [id number]      History   Chief Complaint Chief Complaint  Patient presents with  . Cough  . Generalized Body Aches    HPI Caroline Conway is a 78 y.o. female.   78 yo female with a c/o body aches, chills, nasal congestion, post nasal drainage and cough for one week. Denies any shortness of breath. Has been taking over the counter medication.    Cough   Past Medical History:  Diagnosis Date  . Cancer ([place])    skin ca  . Collagen vascular disease (HCC)    RA  . Depression, major, single episode, in partial remission ([place]) 01/29/2015  . Dyspnea   . GERD (gastroesophageal reflux disease)   . H/O total hysterectomy   . Heartburn   . HLD (hyperlipidemia)   . Hypertension   . Neuromuscular disorder (HCC)    neuropathy in Left lower leg and foot  . Neuropathy   . Rheumatoid arteritis [facility])     Patient Active Problem List   Diagnosis Date Noted  . PAD (peripheral artery disease) ([place]) 05/10/2019  . Pain in limb 05/01/2019  . Chronic bronchitis ([place]) 01/23/2019  . Bunion of great toe of right foot 09/15/2018  . Current moderate episode of major depressive disorder without prior episode ([place]) 03/25/2018  . Abnormal feces   . Benign neoplasm of transverse colon   . Esophageal dysphagia   . Stricture and stenosis of esophagus   . Abnormal CXR 03/21/2017  . Chronic renal insufficiency, stage 3 (moderate) 03/19/2017  . Venous insufficiency of both lower extremities 02/13/2017  . Herpes simplex infection 07/07/2015  . Anxiety disorder 01/29/2015  . Atrophic vaginitis 01/29/2015  . Dyslipidemia 01/29/2015  . Essential (primary) hypertension 01/29/2015  . Acid reflux 01/29/2015  . Idiopathic peripheral neuropathy 01/29/2015  . Idiopathic insomnia 01/29/2015  . Rheumatoid arthritis involving multiple joints ([place]) 01/29/2015  . Urge incontinence 01/29/2015  . Pelvic relaxation due to vaginal  prolapse 01/29/2015    Past Surgical History:  Procedure Laterality Date  . ANTERIOR AND POSTERIOR VAGINAL REPAIR  04/2015  . APPENDECTOMY  1960  . BREAST CYST ASPIRATION Left    lt fna- neg  . COLONOSCOPY WITH PROPOFOL N/A 11/01/2017   Procedure: COLONOSCOPY WITH PROPOFOL;  Surgeon: Lucilla Lame, MD;  Location: [place];  Service: Endoscopy;  Laterality: N/A;  . ESOPHAGEAL DILATION  11/01/2017   Procedure: ESOPHAGEAL DILATION;  Surgeon: Lucilla Lame, MD;  Location: [place];  Service: Endoscopy;;  . ESOPHAGOGASTRODUODENOSCOPY (EGD) WITH PROPOFOL N/A 11/01/2017   Procedure: ESOPHAGOGASTRODUODENOSCOPY (EGD) WITH PROPOFOL;  Surgeon: Lucilla Lame, MD;  Location: [place];  Service: Endoscopy;  Laterality: N/A;  . LUMBAR [place] SURGERY  2011  . POLYPECTOMY  11/01/2017   Procedure: POLYPECTOMY;  Surgeon: Lucilla Lame, MD;  Location: [place];  Service: Endoscopy;;  . riight shoulder surgery Right 1999  . SHOULDER SURGERY Left 2006  . TOTAL KNEE ARTHROPLASTY Left 2011  . VAGINAL HYSTERECTOMY  04/2015    OB History   No obstetric history on file.      Home Medications    Prior to Admission medications   Medication Sig Start Date End Date Taking? Authorizing Provider  acyclovir (ZOVIRAX) 200 MG capsule Take 1 capsule (200 mg total) by mouth 2 (two) times daily. 01/23/19   Glean Hess, MD  atenolol (TENORMIN) 50 MG tablet TAKE 1 TABLET BY MOUTH  EVERY DAY 10/15/18   Glean Hess, MD  Biotin 5000 MCG CAPS Take by mouth.    [provider]  calcium carbonate (OS-CAL) 1250 (500 Ca) MG chewable tablet Chew 1 tablet by mouth daily.    [provider]  cephALEXin (KEFLEX) 250 MG capsule Take by mouth daily.    [provider]  denosumab (PROLIA) 60 MG/ML SOSY injection Inject 60 mg into the skin every 6 (six) months.    [provider]  DULoxetine (CYMBALTA) 20 MG capsule TAKE 1 CAPSULE BY MOUTH EVERY DAY 08/14/17    [provider]  esomeprazole (NEXIUM) 20 MG capsule Take 1 capsule by mouth daily. QHS    [provider]  FLUZONE HIGH-DOSE QUADRIVALENT 0.7 ML SUSY  07/16/19   [provider]  folic acid (FOLVITE) 1 MG tablet Take 2 tablets by mouth daily.     [provider]  hydrochlorothiazide (HYDRODIURIL) 25 MG tablet Take 1 tablet (25 mg total) by mouth daily. Pt takes 25 mg daily 07/28/19   Glean Hess, MD  meloxicam (MOBIC) 15 MG tablet Take 1 tablet (15 mg total) by mouth daily. PRN 07/27/19   Glean Hess, MD  methotrexate 2.5 MG tablet Take 9 tablets by mouth once a week.     [provider]  mirabegron ER (MYRBETRIQ) 50 MG TB24 tablet Take 1 tablet (50 mg total) by mouth daily. 06/22/19   Bjorn Loser, MD  mirtazapine (REMERON) 30 MG tablet TAKE 1 TABLET (30 MG TOTAL) BY MOUTH AT BEDTIME. 02/07/19   Glean Hess, MD  PREMARIN vaginal cream INSERT 1 APPLICATORFUL VAGINALLY AT BEDTIME 07/15/18   Glean Hess, MD  simvastatin (ZOCOR) 20 MG tablet TAKE 1 TABLET BY MOUTH EVERY DAY 01/26/19   Glean Hess, MD  zolpidem (AMBIEN) 5 MG tablet TAKE 1 TABLET BY MOUTH EVERYDAY AT BEDTIME Patient taking differently: Pt takes 1/2 tab qhs 02/26/19   Glean Hess, MD    Family History Family History  Problem Relation Age of Onset  . Alzheimer's disease Mother   . Arthritis Mother   . Lung cancer Father   . Stomach cancer Brother   . Prostate cancer Neg Hx   . Kidney cancer Neg Hx   . Bladder Cancer Neg Hx   . Breast cancer Neg Hx     Social History Social History   Tobacco Use  . Smoking status: Never Smoker  . Smokeless tobacco: Never Used  . Tobacco comment: smoking cessation materials not required  Substance Use Topics  . Alcohol use: No    Alcohol/week: 0.0 standard drinks  . Drug use: No     Allergies   Nitrofuran derivatives, Codeine sulfate, Oxycodone, Propoxyphene, Doxycycline, Penicillins, and Sulfa  antibiotics   Review of Systems Review of Systems  Respiratory: Positive for cough.      Physical Exam Triage Vital Signs ED Triage Vitals  Enc Vitals Group     BP 08/24/19 0939 100/63     Pulse Rate 08/24/19 0939 70     Resp 08/24/19 0939 17     Temp 08/24/19 0939 98.9 F (37.2 C)     Temp Source 08/24/19 0939 Oral     SpO2 08/24/19 0939 94 %     Weight 08/24/19 0941 156 lb 8.4 oz (71 kg)     Height 08/24/19 0941 5' (1.524 m)     Head Circumference --      Peak Flow --  Pain Score 08/24/19 0941 0     Pain Loc --      Pain Edu? --      Excl. in [place]? --    No data found.  Updated Vital Signs BP 100/63 (BP Location: Left Arm)   Pulse 70   Temp 98.9 F (37.2 C) (Oral)   Resp 17   Ht 5' (1.524 m)   Wt 71 kg   SpO2 94%   BMI 30.57 kg/m   Visual Acuity Right Eye Distance:   Left Eye Distance:   Bilateral Distance:    Right Eye Near:   Left Eye Near:    Bilateral Near:     Physical Exam Vitals and nursing note reviewed.  Constitutional:      General: She is not in acute distress.    Appearance: She is not toxic-appearing or diaphoretic.  Cardiovascular:     Rate and Rhythm: Normal rate.     Heart sounds: Normal heart sounds.  Pulmonary:     Effort: Pulmonary effort is normal. No respiratory distress.     Breath sounds: Normal breath sounds. No stridor. No wheezing, rhonchi or rales.  Neurological:     Mental Status: She is alert.      UC Treatments / Results  Labs (all labs ordered are listed, but only abnormal results are displayed) Labs Reviewed  RAPID INFLUENZA A&B ANTIGENS ([facility] ONLY)  NOVEL CORONAVIRUS, NAA (HOSP ORDER, SEND-OUT TO REF LAB; TAT 18-24 HRS)    EKG   Radiology No results found.  Procedures Procedures (including critical care time)  Medications Ordered in UC Medications - No data to display  Initial Impression / Assessment and Plan / UC Course  I have reviewed the triage vital signs and the nursing  notes.  Pertinent labs & imaging results that were available during my care of the patient were reviewed by me and considered in my medical decision making (see chart for details).      Final Clinical Impressions(s) / UC Diagnoses   Final diagnoses:  Viral URI with cough     Discharge Instructions     Rest, fluids, over the counter medications, tylenol as needed    ED Prescriptions    None      1. diagnosis reviewed with patient 2. . Recommend supportive treatment as above 3. covid test done 4. Follow-up prn if symptoms worsen or don't improve   PDMP not reviewed this encounter.   Norval Gable, MD 08/24/19 [phone]

## 2019-08-25 ENCOUNTER — Telehealth: Payer: Self-pay | Admitting: Emergency Medicine

## 2019-08-25 ENCOUNTER — Telehealth: Payer: Self-pay

## 2019-08-25 DIAGNOSIS — F329 Major depressive disorder, single episode, unspecified: Secondary | ICD-10-CM | POA: Diagnosis not present

## 2019-08-25 DIAGNOSIS — N179 Acute kidney failure, unspecified: Secondary | ICD-10-CM | POA: Diagnosis not present

## 2019-08-25 DIAGNOSIS — Z66 Do not resuscitate: Secondary | ICD-10-CM | POA: Diagnosis not present

## 2019-08-25 DIAGNOSIS — E870 Hyperosmolality and hypernatremia: Secondary | ICD-10-CM | POA: Diagnosis not present

## 2019-08-25 DIAGNOSIS — Z9989 Dependence on other enabling machines and devices: Secondary | ICD-10-CM | POA: Diagnosis not present

## 2019-08-25 DIAGNOSIS — Z882 Allergy status to sulfonamides status: Secondary | ICD-10-CM | POA: Diagnosis not present

## 2019-08-25 DIAGNOSIS — Z9981 Dependence on supplemental oxygen: Secondary | ICD-10-CM | POA: Diagnosis not present

## 2019-08-25 DIAGNOSIS — Z9911 Dependence on respirator [ventilator] status: Secondary | ICD-10-CM | POA: Diagnosis not present

## 2019-08-25 DIAGNOSIS — Z880 Allergy status to penicillin: Secondary | ICD-10-CM | POA: Diagnosis not present

## 2019-08-25 DIAGNOSIS — M0550 Rheumatoid polyneuropathy with rheumatoid arthritis of unspecified site: Secondary | ICD-10-CM | POA: Diagnosis not present

## 2019-08-25 DIAGNOSIS — E871 Hypo-osmolality and hyponatremia: Secondary | ICD-10-CM | POA: Diagnosis not present

## 2019-08-25 DIAGNOSIS — R918 Other nonspecific abnormal finding of lung field: Secondary | ICD-10-CM | POA: Diagnosis not present

## 2019-08-25 DIAGNOSIS — R791 Abnormal coagulation profile: Secondary | ICD-10-CM | POA: Diagnosis not present

## 2019-08-25 DIAGNOSIS — R509 Fever, unspecified: Secondary | ICD-10-CM | POA: Diagnosis not present

## 2019-08-25 DIAGNOSIS — J811 Chronic pulmonary edema: Secondary | ICD-10-CM | POA: Diagnosis not present

## 2019-08-25 DIAGNOSIS — U071 COVID-19: Secondary | ICD-10-CM | POA: Diagnosis not present

## 2019-08-25 DIAGNOSIS — J1289 Other viral pneumonia: Secondary | ICD-10-CM | POA: Diagnosis not present

## 2019-08-25 DIAGNOSIS — R0902 Hypoxemia: Secondary | ICD-10-CM | POA: Diagnosis not present

## 2019-08-25 DIAGNOSIS — J9621 Acute and chronic respiratory failure with hypoxia: Secondary | ICD-10-CM | POA: Diagnosis not present

## 2019-08-25 DIAGNOSIS — F419 Anxiety disorder, unspecified: Secondary | ICD-10-CM | POA: Diagnosis not present

## 2019-08-25 DIAGNOSIS — I10 Essential (primary) hypertension: Secondary | ICD-10-CM | POA: Diagnosis not present

## 2019-08-25 DIAGNOSIS — Z85828 Personal history of other malignant neoplasm of skin: Secondary | ICD-10-CM | POA: Diagnosis not present

## 2019-08-25 DIAGNOSIS — R748 Abnormal levels of other serum enzymes: Secondary | ICD-10-CM | POA: Diagnosis not present

## 2019-08-25 DIAGNOSIS — E785 Hyperlipidemia, unspecified: Secondary | ICD-10-CM | POA: Diagnosis not present

## 2019-08-25 DIAGNOSIS — J9601 Acute respiratory failure with hypoxia: Secondary | ICD-10-CM | POA: Diagnosis not present

## 2019-08-25 DIAGNOSIS — N3946 Mixed incontinence: Secondary | ICD-10-CM | POA: Diagnosis not present

## 2019-08-25 DIAGNOSIS — K219 Gastro-esophageal reflux disease without esophagitis: Secondary | ICD-10-CM | POA: Diagnosis not present

## 2019-08-25 DIAGNOSIS — D6859 Other primary thrombophilia: Secondary | ICD-10-CM | POA: Diagnosis not present

## 2019-08-25 DIAGNOSIS — I739 Peripheral vascular disease, unspecified: Secondary | ICD-10-CM | POA: Diagnosis not present

## 2019-08-25 DIAGNOSIS — Z515 Encounter for palliative care: Secondary | ICD-10-CM | POA: Diagnosis not present

## 2019-08-25 DIAGNOSIS — N390 Urinary tract infection, site not specified: Secondary | ICD-10-CM | POA: Diagnosis not present

## 2019-08-25 DIAGNOSIS — D6869 Other thrombophilia: Secondary | ICD-10-CM | POA: Diagnosis not present

## 2019-08-25 DIAGNOSIS — A419 Sepsis, unspecified organism: Secondary | ICD-10-CM | POA: Diagnosis not present

## 2019-08-25 DIAGNOSIS — J189 Pneumonia, unspecified organism: Secondary | ICD-10-CM | POA: Diagnosis not present

## 2019-08-25 LAB — NOVEL CORONAVIRUS, NAA (HOSP ORDER, SEND-OUT TO REF LAB; TAT 18-24 HRS): SARS-CoV-2, NAA: DETECTED — AB

## 2019-08-25 NOTE — Telephone Encounter (Signed)
Patient called and notified of Positive COVID test after lab notified this RN. Patient stated she was notified earlier this morning that her test was negative. Confirmed that test was positive.   Your test for COVID-19 was positive, meaning that you were infected with the novel coronavirus and could give the germ to others. Please continue isolation at home, for at least 10 days since the start of your fever/cough/breathlessness and until you have had 3 consecutive days without fever (without taking a fever reducer) and with cough/breathlessness improving. Please continue good preventive care measures, including: frequent hand-washing, avoid touching your face, cover coughs/sneezes, stay out of crowds and keep a 6 foot distance from others. Recheck or go to the nearest hospital ED tent for re-assessment if fever/cough/breathlessness return.  Patient contacted and made aware of all results, all questions answered.

## 2019-08-25 NOTE — Telephone Encounter (Signed)
Pt notified of negative COVID-19 results. Understanding verbalized.  Chasta M Hopkins   

## 2019-08-26 ENCOUNTER — Telehealth: Payer: Self-pay | Admitting: Nurse Practitioner

## 2019-08-26 NOTE — Telephone Encounter (Signed)
Called to Discuss with patient about Covid symptoms and the use of bamlanivimab, a monoclonal antibody infusion for those with mild to moderate Covid symptoms and at a high risk of hospitalization.     Pt is qualified for this infusion at the [facility] infusion center due to co-morbid conditions and/or a member of an at-risk group.    Patient Active Problem List   Diagnosis Date Noted  . PAD (peripheral artery disease) ([place]) 05/10/2019  . Pain in limb 05/01/2019  . Chronic bronchitis ([place]) 01/23/2019  . Bunion of great toe of right foot 09/15/2018  . Current moderate episode of major depressive disorder without prior episode ([place]) 03/25/2018  . Abnormal feces   . Benign neoplasm of transverse colon   . Esophageal dysphagia   . Stricture and stenosis of esophagus   . Abnormal CXR 03/21/2017  . Chronic renal insufficiency, stage 3 (moderate) 03/19/2017  . Venous insufficiency of both lower extremities 02/13/2017  . Herpes simplex infection 07/07/2015  . Anxiety disorder 01/29/2015  . Atrophic vaginitis 01/29/2015  . Dyslipidemia 01/29/2015  . Essential (primary) hypertension 01/29/2015  . Acid reflux 01/29/2015  . Idiopathic peripheral neuropathy 01/29/2015  . Idiopathic insomnia 01/29/2015  . Rheumatoid arthritis involving multiple joints ([place]) 01/29/2015  . Urge incontinence 01/29/2015  . Pelvic relaxation due to vaginal prolapse 01/29/2015      Patient states that she is currently admitted to [facility] for [place].

## 2019-08-29 ENCOUNTER — Other Ambulatory Visit: Payer: Self-pay | Admitting: Internal Medicine

## 2019-09-07 MED ORDER — SALINE NASAL SPRAY 0.65 % NA SOLN
1.00 | NASAL | Status: DC
Start: ? — End: 2019-09-07

## 2019-09-07 MED ORDER — MORPHINE SULFATE 4 MG/ML IJ SOLN
4.00 | INTRAMUSCULAR | Status: DC
Start: ? — End: 2019-09-07

## 2019-09-07 MED ORDER — LORAZEPAM 2 MG/ML IJ SOLN
1.00 | INTRAMUSCULAR | Status: DC
Start: ? — End: 2019-09-07

## 2019-09-07 MED ORDER — ONDANSETRON HCL 4 MG/2ML IJ SOLN
4.00 | INTRAMUSCULAR | Status: DC
Start: ? — End: 2019-09-07

## 2019-09-07 MED ORDER — MORPHINE SULFATE-NACL 100-0.9 MG/100ML-% IV SOLN
0.00 | INTRAVENOUS | Status: DC
Start: ? — End: 2019-09-07

## 2019-09-08 MED ORDER — LORAZEPAM 2 MG/ML IJ SOLN
1.00 | INTRAMUSCULAR | Status: DC
Start: ? — End: 2019-09-08

## 2019-09-08 MED ORDER — MORPHINE SULFATE-NACL 100-0.9 MG/100ML-% IV SOLN
0.00 | INTRAVENOUS | Status: DC
Start: ? — End: 2019-09-08

## 2019-09-08 MED ORDER — SALINE NASAL SPRAY 0.65 % NA SOLN
1.00 | NASAL | Status: DC
Start: ? — End: 2019-09-08

## 2019-09-08 MED ORDER — MORPHINE SULFATE 4 MG/ML IJ SOLN
4.00 | INTRAMUSCULAR | Status: DC
Start: ? — End: 2019-09-08

## 2019-09-08 MED ORDER — ONDANSETRON HCL 4 MG/2ML IJ SOLN
4.00 | INTRAMUSCULAR | Status: DC
Start: ? — End: 2019-09-08

## 2019-09-11 DEATH — deceased

## 2019-10-24 ENCOUNTER — Other Ambulatory Visit: Payer: Self-pay | Admitting: Internal Medicine

## 2019-11-10 ENCOUNTER — Ambulatory Visit (INDEPENDENT_AMBULATORY_CARE_PROVIDER_SITE_OTHER): Payer: PPO | Admitting: Nurse Practitioner

## 2019-11-10 ENCOUNTER — Encounter (INDEPENDENT_AMBULATORY_CARE_PROVIDER_SITE_OTHER): Payer: PPO

## 2020-01-25 ENCOUNTER — Encounter: Payer: PPO | Admitting: Internal Medicine

## 2020-02-15 ENCOUNTER — Ambulatory Visit: Payer: PPO | Admitting: Urology

## 2020-04-11 ENCOUNTER — Ambulatory Visit: Payer: PPO
